# Patient Record
Sex: Male | Born: 1955 | Race: White | Hispanic: No | Smoking: Never smoker
Health system: Southern US, Community
[De-identification: ages and names within clinical notes are randomized; demographics above are authoritative.]

## PROBLEM LIST (undated history)

## (undated) DIAGNOSIS — B182 Chronic viral hepatitis C: Secondary | ICD-10-CM

## (undated) DIAGNOSIS — I10 Essential (primary) hypertension: Secondary | ICD-10-CM

## (undated) DIAGNOSIS — E871 Hypo-osmolality and hyponatremia: Secondary | ICD-10-CM

## (undated) DIAGNOSIS — D689 Coagulation defect, unspecified: Secondary | ICD-10-CM

## (undated) DIAGNOSIS — F418 Other specified anxiety disorders: Secondary | ICD-10-CM

## (undated) DIAGNOSIS — K7040 Alcoholic hepatic failure without coma: Secondary | ICD-10-CM

## (undated) DIAGNOSIS — K529 Noninfective gastroenteritis and colitis, unspecified: Secondary | ICD-10-CM

## (undated) DIAGNOSIS — R51 Headache: Secondary | ICD-10-CM

## (undated) DIAGNOSIS — D649 Anemia, unspecified: Secondary | ICD-10-CM

## (undated) DIAGNOSIS — E43 Unspecified severe protein-calorie malnutrition: Secondary | ICD-10-CM

## (undated) DIAGNOSIS — F1011 Alcohol abuse, in remission: Secondary | ICD-10-CM

## (undated) DIAGNOSIS — G4730 Sleep apnea, unspecified: Secondary | ICD-10-CM

## (undated) DIAGNOSIS — F419 Anxiety disorder, unspecified: Secondary | ICD-10-CM

## (undated) DIAGNOSIS — J309 Allergic rhinitis, unspecified: Secondary | ICD-10-CM

## (undated) DIAGNOSIS — B1920 Unspecified viral hepatitis C without hepatic coma: Secondary | ICD-10-CM

## (undated) DIAGNOSIS — R188 Other ascites: Secondary | ICD-10-CM

## (undated) DIAGNOSIS — G4710 Hypersomnia, unspecified: Secondary | ICD-10-CM

## (undated) DIAGNOSIS — E8809 Other disorders of plasma-protein metabolism, not elsewhere classified: Secondary | ICD-10-CM

## (undated) DIAGNOSIS — Z8719 Personal history of other diseases of the digestive system: Secondary | ICD-10-CM

## (undated) DIAGNOSIS — K5190 Ulcerative colitis, unspecified, without complications: Secondary | ICD-10-CM

## (undated) DIAGNOSIS — K7460 Unspecified cirrhosis of liver: Secondary | ICD-10-CM

## (undated) DIAGNOSIS — D696 Thrombocytopenia, unspecified: Secondary | ICD-10-CM

## (undated) DIAGNOSIS — K7290 Hepatic failure, unspecified without coma: Secondary | ICD-10-CM

## (undated) DIAGNOSIS — K5289 Other specified noninfective gastroenteritis and colitis: Secondary | ICD-10-CM

## (undated) HISTORY — PX: TONSILLECTOMY: SUR1361

## (undated) HISTORY — DX: Noninfective gastroenteritis and colitis, unspecified: K52.9

## (undated) HISTORY — DX: Headache: R51

## (undated) HISTORY — PX: PARACENTESIS: SHX844

## (undated) HISTORY — DX: Alcohol abuse, in remission: F10.11

## (undated) HISTORY — DX: Anxiety disorder, unspecified: F41.9

## (undated) HISTORY — DX: Unspecified viral hepatitis C without hepatic coma: B19.20

---

## 2000-05-26 ENCOUNTER — Inpatient Hospital Stay (HOSPITAL_COMMUNITY): Admission: EM | Admit: 2000-05-26 | Discharge: 2000-05-27 | Payer: Self-pay | Admitting: Emergency Medicine

## 2000-05-26 ENCOUNTER — Encounter: Payer: Self-pay | Admitting: Emergency Medicine

## 2003-03-26 ENCOUNTER — Encounter: Payer: Self-pay | Admitting: Family Medicine

## 2003-03-26 ENCOUNTER — Ambulatory Visit (HOSPITAL_COMMUNITY): Admission: RE | Admit: 2003-03-26 | Discharge: 2003-03-26 | Payer: Self-pay | Admitting: Family Medicine

## 2003-04-24 ENCOUNTER — Ambulatory Visit (HOSPITAL_COMMUNITY): Admission: RE | Admit: 2003-04-24 | Discharge: 2003-04-24 | Payer: Self-pay | Admitting: Neurology

## 2003-07-04 ENCOUNTER — Emergency Department (HOSPITAL_COMMUNITY): Admission: EM | Admit: 2003-07-04 | Discharge: 2003-07-04 | Payer: Self-pay | Admitting: Emergency Medicine

## 2003-08-04 ENCOUNTER — Emergency Department (HOSPITAL_COMMUNITY): Admission: EM | Admit: 2003-08-04 | Discharge: 2003-08-05 | Payer: Self-pay | Admitting: *Deleted

## 2003-12-10 ENCOUNTER — Encounter: Payer: Self-pay | Admitting: Internal Medicine

## 2004-05-05 ENCOUNTER — Ambulatory Visit: Payer: Self-pay | Admitting: Internal Medicine

## 2004-06-07 ENCOUNTER — Ambulatory Visit: Payer: Self-pay | Admitting: Internal Medicine

## 2004-07-01 ENCOUNTER — Ambulatory Visit: Payer: Self-pay | Admitting: Internal Medicine

## 2004-11-12 ENCOUNTER — Emergency Department (HOSPITAL_COMMUNITY): Admission: EM | Admit: 2004-11-12 | Discharge: 2004-11-12 | Payer: Self-pay | Admitting: Emergency Medicine

## 2004-11-16 ENCOUNTER — Ambulatory Visit: Payer: Self-pay | Admitting: Internal Medicine

## 2005-07-05 ENCOUNTER — Ambulatory Visit: Payer: Self-pay | Admitting: Internal Medicine

## 2005-07-13 ENCOUNTER — Ambulatory Visit: Payer: Self-pay | Admitting: Internal Medicine

## 2005-07-28 ENCOUNTER — Encounter: Payer: Self-pay | Admitting: Gastroenterology

## 2005-07-31 ENCOUNTER — Ambulatory Visit: Payer: Self-pay | Admitting: Internal Medicine

## 2005-08-09 ENCOUNTER — Ambulatory Visit: Payer: Self-pay | Admitting: Internal Medicine

## 2005-08-18 ENCOUNTER — Ambulatory Visit: Payer: Self-pay | Admitting: Gastroenterology

## 2005-08-22 ENCOUNTER — Encounter: Payer: Self-pay | Admitting: Internal Medicine

## 2005-08-23 ENCOUNTER — Ambulatory Visit: Payer: Self-pay | Admitting: Gastroenterology

## 2005-08-23 ENCOUNTER — Encounter (INDEPENDENT_AMBULATORY_CARE_PROVIDER_SITE_OTHER): Payer: Self-pay | Admitting: Specialist

## 2005-08-23 LAB — HM COLONOSCOPY

## 2005-08-25 ENCOUNTER — Encounter: Payer: Self-pay | Admitting: Internal Medicine

## 2005-10-02 ENCOUNTER — Ambulatory Visit: Payer: Self-pay | Admitting: Gastroenterology

## 2005-11-24 ENCOUNTER — Ambulatory Visit: Payer: Self-pay | Admitting: Internal Medicine

## 2006-01-01 ENCOUNTER — Encounter: Admission: RE | Admit: 2006-01-01 | Discharge: 2006-01-01 | Payer: Self-pay | Admitting: Internal Medicine

## 2006-11-02 ENCOUNTER — Encounter: Payer: Self-pay | Admitting: Internal Medicine

## 2007-04-04 ENCOUNTER — Ambulatory Visit: Payer: Self-pay | Admitting: Internal Medicine

## 2007-04-04 DIAGNOSIS — K5289 Other specified noninfective gastroenteritis and colitis: Secondary | ICD-10-CM | POA: Insufficient documentation

## 2007-04-04 DIAGNOSIS — F1011 Alcohol abuse, in remission: Secondary | ICD-10-CM | POA: Insufficient documentation

## 2007-04-04 DIAGNOSIS — B1710 Acute hepatitis C without hepatic coma: Secondary | ICD-10-CM | POA: Insufficient documentation

## 2007-04-04 DIAGNOSIS — F411 Generalized anxiety disorder: Secondary | ICD-10-CM | POA: Insufficient documentation

## 2007-04-04 HISTORY — DX: Other specified noninfective gastroenteritis and colitis: K52.89

## 2007-04-26 ENCOUNTER — Ambulatory Visit: Payer: Self-pay | Admitting: Internal Medicine

## 2007-04-30 LAB — CONVERTED CEMR LAB
ALT: 130 units/L — ABNORMAL HIGH (ref 0–53)
AST: 111 units/L — ABNORMAL HIGH (ref 0–37)
Potassium: 4.3 meq/L (ref 3.5–5.1)

## 2007-05-14 ENCOUNTER — Encounter: Payer: Self-pay | Admitting: Internal Medicine

## 2007-08-23 ENCOUNTER — Encounter (INDEPENDENT_AMBULATORY_CARE_PROVIDER_SITE_OTHER): Payer: Self-pay | Admitting: *Deleted

## 2007-10-28 ENCOUNTER — Encounter: Payer: Self-pay | Admitting: Internal Medicine

## 2008-01-29 ENCOUNTER — Ambulatory Visit: Payer: Self-pay | Admitting: Internal Medicine

## 2008-01-29 DIAGNOSIS — R5383 Other fatigue: Secondary | ICD-10-CM

## 2008-01-29 DIAGNOSIS — R5381 Other malaise: Secondary | ICD-10-CM | POA: Insufficient documentation

## 2008-01-29 DIAGNOSIS — R51 Headache: Secondary | ICD-10-CM

## 2008-01-29 DIAGNOSIS — R519 Headache, unspecified: Secondary | ICD-10-CM | POA: Insufficient documentation

## 2008-01-30 ENCOUNTER — Telehealth (INDEPENDENT_AMBULATORY_CARE_PROVIDER_SITE_OTHER): Payer: Self-pay | Admitting: *Deleted

## 2008-01-30 ENCOUNTER — Encounter (INDEPENDENT_AMBULATORY_CARE_PROVIDER_SITE_OTHER): Payer: Self-pay | Admitting: *Deleted

## 2008-01-30 LAB — CONVERTED CEMR LAB
ALT: 202 units/L — ABNORMAL HIGH (ref 0–53)
AST: 168 units/L — ABNORMAL HIGH (ref 0–37)
Albumin: 3.6 g/dL (ref 3.5–5.2)
Alkaline Phosphatase: 60 units/L (ref 39–117)
BUN: 12 mg/dL (ref 6–23)
Basophils Relative: 0.2 % (ref 0.0–3.0)
CO2: 29 meq/L (ref 19–32)
Chloride: 100 meq/L (ref 96–112)
Creatinine, Ser: 0.9 mg/dL (ref 0.4–1.5)
Eosinophils Relative: 1.2 % (ref 0.0–5.0)
Lymphocytes Relative: 23.8 % (ref 12.0–46.0)
Neutrophils Relative %: 63.5 % (ref 43.0–77.0)
RBC: 4.4 M/uL (ref 4.22–5.81)
Total Bilirubin: 0.9 mg/dL (ref 0.3–1.2)
WBC: 5 10*3/uL (ref 4.5–10.5)

## 2008-02-10 ENCOUNTER — Telehealth: Payer: Self-pay | Admitting: Gastroenterology

## 2008-02-11 ENCOUNTER — Telehealth: Payer: Self-pay | Admitting: Gastroenterology

## 2008-02-13 ENCOUNTER — Ambulatory Visit: Payer: Self-pay | Admitting: Gastroenterology

## 2008-02-13 ENCOUNTER — Telehealth: Payer: Self-pay | Admitting: Gastroenterology

## 2008-02-14 ENCOUNTER — Telehealth: Payer: Self-pay | Admitting: Gastroenterology

## 2008-02-17 ENCOUNTER — Telehealth: Payer: Self-pay | Admitting: Gastroenterology

## 2008-02-21 ENCOUNTER — Ambulatory Visit: Payer: Self-pay | Admitting: Internal Medicine

## 2008-02-21 ENCOUNTER — Telehealth: Payer: Self-pay | Admitting: Gastroenterology

## 2008-02-21 LAB — CONVERTED CEMR LAB
Blood in Urine, dipstick: NEGATIVE
Glucose, Bld: 116 mg/dL
Nitrite: NEGATIVE
Rapid Strep: NEGATIVE
Urobilinogen, UA: 0.2
WBC Urine, dipstick: NEGATIVE

## 2008-02-22 ENCOUNTER — Encounter: Payer: Self-pay | Admitting: Internal Medicine

## 2008-02-22 LAB — CONVERTED CEMR LAB
Squamous Epithelial / LPF: NONE SEEN /lpf
WBC, UA: NONE SEEN cells/hpf (ref <3)

## 2008-02-24 ENCOUNTER — Telehealth: Payer: Self-pay | Admitting: Gastroenterology

## 2008-02-24 ENCOUNTER — Telehealth (INDEPENDENT_AMBULATORY_CARE_PROVIDER_SITE_OTHER): Payer: Self-pay | Admitting: *Deleted

## 2008-02-24 LAB — CONVERTED CEMR LAB
Alkaline Phosphatase: 64 units/L (ref 39–117)
Basophils Absolute: 0 10*3/uL (ref 0.0–0.1)
Bilirubin, Direct: 0.2 mg/dL (ref 0.0–0.3)
Calcium: 9.3 mg/dL (ref 8.4–10.5)
Eosinophils Absolute: 0 10*3/uL (ref 0.0–0.7)
GFR calc Af Amer: 114 mL/min
GFR calc non Af Amer: 94 mL/min
Glucose, Bld: 115 mg/dL — ABNORMAL HIGH (ref 70–99)
HCT: 47 % (ref 39.0–52.0)
MCHC: 34.4 g/dL (ref 30.0–36.0)
Monocytes Absolute: 0.7 10*3/uL (ref 0.1–1.0)
Monocytes Relative: 6.9 % (ref 3.0–12.0)
Neutro Abs: 8.9 10*3/uL — ABNORMAL HIGH (ref 1.4–7.7)
Platelets: 110 10*3/uL — ABNORMAL LOW (ref 150–400)
Potassium: 4.4 meq/L (ref 3.5–5.1)
RDW: 11.7 % (ref 11.5–14.6)
Sed Rate: 6 mm/hr (ref 0–16)
Sodium: 135 meq/L (ref 135–145)
Total Bilirubin: 1.1 mg/dL (ref 0.3–1.2)

## 2008-03-10 ENCOUNTER — Telehealth: Payer: Self-pay | Admitting: Internal Medicine

## 2008-03-11 ENCOUNTER — Ambulatory Visit: Payer: Self-pay | Admitting: Gastroenterology

## 2008-03-26 ENCOUNTER — Telehealth: Payer: Self-pay | Admitting: Internal Medicine

## 2008-04-28 ENCOUNTER — Ambulatory Visit: Payer: Self-pay | Admitting: Internal Medicine

## 2008-04-28 DIAGNOSIS — J309 Allergic rhinitis, unspecified: Secondary | ICD-10-CM

## 2008-04-28 DIAGNOSIS — R55 Syncope and collapse: Secondary | ICD-10-CM | POA: Insufficient documentation

## 2008-04-28 HISTORY — DX: Allergic rhinitis, unspecified: J30.9

## 2008-05-01 ENCOUNTER — Telehealth (INDEPENDENT_AMBULATORY_CARE_PROVIDER_SITE_OTHER): Payer: Self-pay | Admitting: *Deleted

## 2008-05-04 ENCOUNTER — Telehealth (INDEPENDENT_AMBULATORY_CARE_PROVIDER_SITE_OTHER): Payer: Self-pay | Admitting: *Deleted

## 2008-05-07 ENCOUNTER — Telehealth: Payer: Self-pay | Admitting: Gastroenterology

## 2008-05-11 ENCOUNTER — Encounter: Payer: Self-pay | Admitting: Internal Medicine

## 2008-06-17 ENCOUNTER — Telehealth (INDEPENDENT_AMBULATORY_CARE_PROVIDER_SITE_OTHER): Payer: Self-pay | Admitting: *Deleted

## 2008-08-06 ENCOUNTER — Telehealth (INDEPENDENT_AMBULATORY_CARE_PROVIDER_SITE_OTHER): Payer: Self-pay | Admitting: *Deleted

## 2008-08-13 ENCOUNTER — Ambulatory Visit: Payer: Self-pay | Admitting: Internal Medicine

## 2008-08-13 DIAGNOSIS — F329 Major depressive disorder, single episode, unspecified: Secondary | ICD-10-CM

## 2008-08-25 ENCOUNTER — Telehealth: Payer: Self-pay | Admitting: Internal Medicine

## 2008-08-31 ENCOUNTER — Telehealth: Payer: Self-pay | Admitting: Internal Medicine

## 2008-09-01 ENCOUNTER — Ambulatory Visit: Payer: Self-pay | Admitting: Internal Medicine

## 2008-10-02 ENCOUNTER — Encounter: Payer: Self-pay | Admitting: Internal Medicine

## 2008-10-05 ENCOUNTER — Telehealth: Payer: Self-pay | Admitting: Internal Medicine

## 2008-11-17 ENCOUNTER — Encounter: Payer: Self-pay | Admitting: Internal Medicine

## 2008-11-24 ENCOUNTER — Ambulatory Visit: Payer: Self-pay | Admitting: Internal Medicine

## 2008-11-24 DIAGNOSIS — R197 Diarrhea, unspecified: Secondary | ICD-10-CM

## 2008-11-25 LAB — CONVERTED CEMR LAB: TSH: 1.71 microintl units/mL (ref 0.35–5.50)

## 2008-12-11 ENCOUNTER — Encounter: Payer: Self-pay | Admitting: Gastroenterology

## 2008-12-16 ENCOUNTER — Encounter: Payer: Self-pay | Admitting: Internal Medicine

## 2009-01-26 ENCOUNTER — Ambulatory Visit: Payer: Self-pay | Admitting: Internal Medicine

## 2009-02-09 ENCOUNTER — Telehealth: Payer: Self-pay | Admitting: Internal Medicine

## 2009-02-26 ENCOUNTER — Telehealth (INDEPENDENT_AMBULATORY_CARE_PROVIDER_SITE_OTHER): Payer: Self-pay | Admitting: *Deleted

## 2009-03-09 ENCOUNTER — Telehealth: Payer: Self-pay | Admitting: Internal Medicine

## 2009-03-18 ENCOUNTER — Telehealth: Payer: Self-pay | Admitting: Internal Medicine

## 2009-05-05 ENCOUNTER — Telehealth: Payer: Self-pay | Admitting: Internal Medicine

## 2009-06-09 ENCOUNTER — Encounter: Payer: Self-pay | Admitting: Internal Medicine

## 2009-06-11 ENCOUNTER — Telehealth (INDEPENDENT_AMBULATORY_CARE_PROVIDER_SITE_OTHER): Payer: Self-pay | Admitting: *Deleted

## 2009-06-15 ENCOUNTER — Ambulatory Visit: Payer: Self-pay | Admitting: Family

## 2009-06-15 DIAGNOSIS — J321 Chronic frontal sinusitis: Secondary | ICD-10-CM

## 2009-06-15 DIAGNOSIS — Z8719 Personal history of other diseases of the digestive system: Secondary | ICD-10-CM

## 2009-06-15 HISTORY — DX: Personal history of other diseases of the digestive system: Z87.19

## 2009-06-30 ENCOUNTER — Ambulatory Visit: Payer: Self-pay | Admitting: Internal Medicine

## 2009-07-23 ENCOUNTER — Encounter: Payer: Self-pay | Admitting: Internal Medicine

## 2009-08-06 ENCOUNTER — Ambulatory Visit: Payer: Self-pay | Admitting: Internal Medicine

## 2009-08-09 LAB — CONVERTED CEMR LAB
Basophils Absolute: 0 10*3/uL (ref 0.0–0.1)
Basophils Relative: 0 % (ref 0.0–3.0)
CO2: 29 meq/L (ref 19–32)
Calcium: 9.3 mg/dL (ref 8.4–10.5)
Chloride: 105 meq/L (ref 96–112)
Eosinophils Absolute: 0.1 10*3/uL (ref 0.0–0.7)
Glucose, Bld: 109 mg/dL — ABNORMAL HIGH (ref 70–99)
Hemoglobin: 14.8 g/dL (ref 13.0–17.0)
Lymphs Abs: 1.5 10*3/uL (ref 0.7–4.0)
MCHC: 32.9 g/dL (ref 30.0–36.0)
MCV: 93.8 fL (ref 78.0–100.0)
Monocytes Absolute: 0.5 10*3/uL (ref 0.1–1.0)
Neutro Abs: 3.7 10*3/uL (ref 1.4–7.7)
RBC: 4.8 M/uL (ref 4.22–5.81)
RDW: 13.9 % (ref 11.5–14.6)
Sodium: 139 meq/L (ref 135–145)

## 2009-08-11 ENCOUNTER — Encounter (INDEPENDENT_AMBULATORY_CARE_PROVIDER_SITE_OTHER): Payer: Self-pay | Admitting: *Deleted

## 2009-08-12 LAB — CONVERTED CEMR LAB
Testosterone Free: 41.4 pg/mL — ABNORMAL LOW (ref 47.0–244.0)
Testosterone-% Free: 0.7 % — ABNORMAL LOW (ref 1.6–2.9)

## 2009-09-02 ENCOUNTER — Telehealth (INDEPENDENT_AMBULATORY_CARE_PROVIDER_SITE_OTHER): Payer: Self-pay | Admitting: *Deleted

## 2009-09-17 ENCOUNTER — Encounter: Payer: Self-pay | Admitting: Internal Medicine

## 2009-09-20 ENCOUNTER — Ambulatory Visit: Payer: Self-pay | Admitting: Pulmonary Disease

## 2009-09-20 DIAGNOSIS — G4710 Hypersomnia, unspecified: Secondary | ICD-10-CM

## 2009-09-20 DIAGNOSIS — G4730 Sleep apnea, unspecified: Secondary | ICD-10-CM

## 2009-09-20 HISTORY — DX: Hypersomnia, unspecified: G47.10

## 2009-09-21 ENCOUNTER — Encounter: Payer: Self-pay | Admitting: Internal Medicine

## 2009-09-28 ENCOUNTER — Ambulatory Visit: Payer: Self-pay | Admitting: Internal Medicine

## 2009-09-30 ENCOUNTER — Encounter: Payer: Self-pay | Admitting: Internal Medicine

## 2009-10-22 ENCOUNTER — Telehealth (INDEPENDENT_AMBULATORY_CARE_PROVIDER_SITE_OTHER): Payer: Self-pay | Admitting: *Deleted

## 2009-10-29 ENCOUNTER — Encounter: Payer: Self-pay | Admitting: Internal Medicine

## 2009-11-08 ENCOUNTER — Telehealth (INDEPENDENT_AMBULATORY_CARE_PROVIDER_SITE_OTHER): Payer: Self-pay | Admitting: *Deleted

## 2009-12-01 ENCOUNTER — Encounter: Payer: Self-pay | Admitting: Internal Medicine

## 2010-03-22 ENCOUNTER — Encounter: Payer: Self-pay | Admitting: Internal Medicine

## 2010-07-24 LAB — CONVERTED CEMR LAB
ALT: 126 units/L — ABNORMAL HIGH (ref 0–53)
ALT: 232 units/L — ABNORMAL HIGH (ref 0–53)
AST: 101 units/L — ABNORMAL HIGH (ref 0–37)
Albumin: 3.9 g/dL (ref 3.5–5.2)
Alkaline Phosphatase: 73 units/L (ref 39–117)
Alkaline Phosphatase: 84 units/L (ref 39–117)
BUN: 22 mg/dL (ref 6–23)
Basophils Absolute: 0 10*3/uL (ref 0.0–0.1)
Basophils Relative: 0 % (ref 0.0–1.0)
Bilirubin, Direct: 0.2 mg/dL (ref 0.0–0.3)
Bilirubin, Direct: 0.5 mg/dL — ABNORMAL HIGH (ref 0.0–0.3)
CO2: 26 meq/L (ref 19–32)
Calcium: 9.7 mg/dL (ref 8.4–10.5)
Chloride: 103 meq/L (ref 96–112)
Cholesterol: 174 mg/dL (ref 0–200)
Creatinine, Ser: 0.6 mg/dL (ref 0.4–1.5)
Eosinophils Absolute: 0 10*3/uL (ref 0.0–0.6)
Eosinophils Absolute: 0.1 10*3/uL (ref 0.0–0.7)
Eosinophils Relative: 0.6 % (ref 0.0–5.0)
Eosinophils Relative: 1.5 % (ref 0.0–5.0)
GFR calc Af Amer: 183 mL/min
GFR calc non Af Amer: 151 mL/min
Glucose, Bld: 104 mg/dL — ABNORMAL HIGH (ref 70–99)
HCT: 45.5 % (ref 39.0–52.0)
HDL: 42.5 mg/dL (ref >39.0)
Hemoglobin: 15.8 g/dL (ref 13.0–17.0)
LDL Cholesterol: 107 mg/dL — ABNORMAL HIGH (ref 0–99)
Lymphocytes Relative: 20.2 % (ref 12.0–46.0)
MCHC: 34.8 g/dL (ref 30.0–36.0)
MCV: 95.1 fL (ref 78.0–100.0)
Monocytes Absolute: 0.6 10*3/uL (ref 0.1–1.0)
Monocytes Absolute: 0.6 10*3/uL (ref 0.2–0.7)
Monocytes Relative: 13 % — ABNORMAL HIGH (ref 3.0–12.0)
Monocytes Relative: 8.6 % (ref 3.0–11.0)
Neutro Abs: 5.4 10*3/uL (ref 1.4–7.7)
Neutrophils Relative %: 62 % (ref 43.0–77.0)
Neutrophils Relative %: 70.6 % (ref 43.0–77.0)
PSA: 0.63 ng/mL (ref 0.10–4.00)
Platelets: 150 10*3/uL (ref 150–400)
Platelets: 73 10*3/uL — ABNORMAL LOW (ref 150–400)
Potassium: 5.4 meq/L — ABNORMAL HIGH (ref 3.5–5.1)
RBC: 4.79 M/uL (ref 4.22–5.81)
RDW: 11.2 % — ABNORMAL LOW (ref 11.5–14.6)
RDW: 12.8 % (ref 11.5–14.6)
Sodium: 137 meq/L (ref 135–145)
TSH: 1.58 microintl units/mL (ref 0.35–5.50)
Total Bilirubin: 1 mg/dL (ref 0.3–1.2)
Total Bilirubin: 1 mg/dL (ref 0.3–1.2)
Total CHOL/HDL Ratio: 4.1
Total Protein: 6.5 g/dL (ref 6.0–8.3)
Total Protein: 7 g/dL (ref 6.0–8.3)
Triglycerides: 123 mg/dL (ref 0–149)
VLDL: 25 mg/dL (ref 0–40)
WBC: 4.3 10*3/uL — ABNORMAL LOW (ref 4.5–10.5)
WBC: 7.5 10*3/uL (ref 4.5–10.5)

## 2010-07-26 NOTE — Letter (Signed)
[facility]   Imported By: Lanelle Bal 10/01/2009 13:18:03  _____________________________________________________________________  External Attachment:    Type:   Image     Comment:   External Document

## 2010-07-26 NOTE — Assessment & Plan Note (Signed)
Summary: fatigue/apc   Visit Type:  Initial Consult Copy to:  pcp Primary Provider/Referring Provider:  Willow Ora, MD  CC:  Pt here for sleep consult. 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 His ex wife had noted loud snoring especially on his back. He reprots excessive daytime fatigue. TSH & testesterone levels are nml. Epworth Sleepiness Score is 8/24. Bedtime is 10.30 p, latency is a few mins,  has 4-5 spontnaeous awakenings, gets oob at 645 am, drops his son to school & then sleeps again from 8a to 11 30 A. No bed partner history is available.He denies gasping or choking episodes. He ahs gained 20 lbs in the last 2 years. There is no history suggestive of cataplexy, sleep paralysis or parasomnias   Preventive Screening-Counseling & Management  Alcohol-Tobacco     Smoking Status: quit     Packs/Day: 1.0     Year Started: 1980     Year Quit: 2005   History of Present Illness: Sleep all the time wake up take my son to school 6:45 am go back to bed for about 5 hours wake up, go bed 10:45pm  What time do you typically go to bed?(between what hours): 10:30pm  How long does it take you to fall asleep? sometimes 5 mins, sometimes longer  How many times during the night do you wake up? 4 or 5 times  What time do you get out of bed to start your day? 6:45am  Do you drive or operate heavy machinery in your occupation? no  How much has your weight changed (up or down) over the past two years? (in pounds): increase 20 lbs  Have you ever had a sleep study before?  If yes,when and where: no  Do you currently use CPAP ? If so , at what pressure? no  Do you wear oxygen at any time? If yes, how many liters per minute? no Current Medications (verified): 1)  Colazal 750 Mg Caps (Balsalazide Disodium) .... 3 By Mouth Three Times A Day 2)  Clarinex 5 Mg Tabs (Desloratadine) .Bradley Woods.. 1 By Mouth Once Daily 3)  Acidophilus   Caps (Lactobacillus) .... Take 1 Tablet By Mouth Once A Day 4)  Imodium Advanced 2-125 Mg Chew (Loperamide-Simethicone) .... Take 1 Tablet By Mouth Once A Day 5)  Gas-X Extra Strength 125 Mg Chew (Simethicone) .... Take 1 Tablet By Mouth Once A Day  Allergies (verified): 1)  ! * Antibiotics  Past History:  Past Surgical History: Tonsillectomy  Social History: separated (2008) but good relationship w/ wife single father on disability Patient has never smoked.  Alcohol Use - no Illicit Drug Use - no Daily Caffeine Use Pt is on Liver Transplant @ [facility] Patient states former smoker.  Smoking Status:  quit Packs/Day:  1.0  Review of Systems       The patient complains of weight change, abdominal pain, headaches, sneezing, and anxiety.  The patient denies shortness of breath with activity, shortness of breath at rest, productive cough, non-productive cough, coughing up blood, chest pain, irregular heartbeats, acid heartburn, indigestion, loss of appetite, difficulty swallowing, sore throat, tooth/dental problems, nasal congestion/difficulty breathing through nose, itching, ear ache, depression, hand/feet swelling, joint stiffness or pain, rash, change in color of mucus, and fever.    Vital Signs:  Patient profile:   55 year old male Height:      69 inches Weight:      206.13  pounds BMI:     30.55 O2 Sat:      97 % on Room air Temp:     97.9 degrees F oral Pulse rate:   75 / minute BP sitting:   150 / 84  (left arm) Cuff size:   regular  Vitals Entered By: Zackery Barefoot CMA (September 20, 2009 9:15 AM)  O2 Flow:  Room air CC: Pt here for sleep consult.  Comments Medications reviewed with patient Verified contact number and pharmacy with patient Zackery Barefoot CMA  September 20, 2009 9:22 AM    Physical Exam  Additional Exam:  Gen. Pleasant, well-nourished, in no distress, normal affect ENT - no lesions, no post nasal drip, class 2 airway Neck: No JVD, no  thyromegaly, no carotid bruits Lungs: no use of accessory muscles, no dullness to percussion, clear without rales or rhonchi  Cardiovascular: Rhythm regular, heart sounds  normal, no murmurs or gallops, no peripheral edema Abdomen: soft and non-tender, no hepatosplenomegaly, BS normal. Musculoskeletal: No deformities, no cyanosis or clubbing Neuro:  alert, non focal     Impression & Recommendations:  Problem # 1:  HYPERSOMNIA, ASSOCIATED WITH SLEEP APNEA (ICD-780.53) The pathophysiology of obstructive sleep apnea, it's cardiovascular consequences and modes of treatment including CPAP were discussed with the patient in great detail.  Since other etiologies of fatigue & somnolence have been investigated, it is reasonable to proceed with overnight polysomnogram. The basence of bed partner history makes it difficult to give an accurate pre-test probability. Weight loss advised, would likley treat if AHI 15 & above. Orders: Consultation Level III (06301) Sleep Disorder Referral (Sleep Disorder)  Medications Added to Medication List This Visit: 1)  Acidophilus Caps (Lactobacillus) .... Take 1 tablet by mouth once a day 2)  Imodium Advanced 2-125 Mg Chew (Loperamide-simethicone) .... Take 1 tablet by mouth once a day 3)  Gas-x Extra Strength 125 Mg Chew (Simethicone) .... Take 1 tablet by mouth once a day  Patient Instructions: 1)  Copy sent to:Dr paz 2)  Please schedule a follow-up appointment in 2 weeks after sleep study

## 2010-07-26 NOTE — Assessment & Plan Note (Signed)
Summary: Bradley Woods/swh   Vital Signs:  Patient profile:   55 year old male Height:      69 inches Weight:      202.2 pounds BMI:     29.97 Pulse rate:   64 / minute BP sitting:   122 / 80  Vitals Entered By: Shary Decamp (September 28, 2009 11:04 AM) CC: Bradley Woods    History of Present Illness: Bradley Woods patient just  like to keep me updated on what's going on with his health  extensive notes from [facility] reviewed he was seen there with diarrhea, hematochezia and mucus discharge.  He has a history of ulcerative proctitis. having difficulty controlling his bowels  They recommended him to have a fecal cal protectin  test  saw pulmonary, sleep study pending  to see Hep C clinic in september   continue w/ fatigue , slow thinking  may need to get a disability update   Current Medications (verified): 1)  Colazal 750 Mg Caps (Balsalazide Disodium) .... 3 By Mouth Three Times A Day 2)  Clarinex 5 Mg Tabs (Desloratadine) .Marland Kitchen.. 1 By Mouth Once Daily 3)  Acidophilus  Caps (Lactobacillus) .... Take 1 Tablet By Mouth Once A Day 4)  Imodium Advanced 2-125 Mg Chew (Loperamide-Simethicone) .... Take 1 Tablet By Mouth Once A Day 5)  Gas-X Extra Strength 125 Mg Chew (Simethicone) .... Take 1 Tablet By Mouth Once A Day  Allergies (verified): 1)  ! * Antibiotics  Past History:  Past Medical History: Reviewed history from 11/24/2008 and no changes required. Hepatitis C Anxiety x a while, Dx w/ depression 07-2008 h/o COLITIS   ALCOHOL ABUSE, IN REMISSION (ICD-305.03) Headache Allergic rhinitis disable (since aprox 2005)  Past Surgical History: Reviewed history from 09/20/2009 and no changes required. Tonsillectomy  Review of Systems       anxiety and depression still there, not interested in treatment no suicidal ideas  Physical Exam  General:  alert and well-developed.   Lungs:  normal respiratory effort, no intercostal retractions, no accessory muscle use, and normal breath sounds.     Heart:  normal rate, regular rhythm, no murmur, and no gallop.     Impression & Recommendations:  Problem # 1:  DISABILITY (ICD-349.0)  patient is currently on disability apparently his disability  is up for review, he feels that he cannot work at present due to his multiple medical conditions and lack of energy "in a good day I have 4 hours of energy; and still be able to go to the "[employer]"  sometimes" paper chart is reviewed, in the past I have said that he is very bright disabled due to fatigue. If  he  needs  ongoing/permanent disability he will need an  assessment from PT/OT  Problem # 2:  HEPATITIS C (ICD-070.51) per [facility]  Problem # 3:  COLITIS (ICD-558.9) Per [facility] His updated medication list for this problem includes:    Acidophilus Caps (Lactobacillus) .Marland Kitchen... Take 1 tablet by mouth once a day    Imodium Advanced 2-125 Mg Chew (Loperamide-simethicone) .Marland Kitchen... Take 1 tablet by mouth once a day  Complete Medication List: 1)  Colazal 750 Mg Caps (Balsalazide disodium) .... 3 by mouth three times a day 2)  Clarinex 5 Mg Tabs (Desloratadine) .Marland Kitchen.. 1 by mouth once daily 3)  Acidophilus Caps (Lactobacillus) .... Take 1 tablet by mouth once a day 4)  Imodium Advanced 2-125 Mg Chew (Loperamide-simethicone) .... Take 1 tablet by mouth once a day 5)  Gas-x Extra Strength 125 Mg Chew (Simethicone) .... Take 1 tablet by mouth once a day  Other Orders: T-Culture, Stool ([id number])

## 2010-07-26 NOTE — Letter (Signed)
[facility]   Imported By: Lanelle Bal 10/06/2009 09:05:12  _____________________________________________________________________  External Attachment:    Type:   Image     Comment:   External Document

## 2010-07-26 NOTE — Progress Notes (Signed)
Summary: FAX REPORT TO [facility] FOR REFERRAL  Phone Note Call from Patient Call back at Home Phone [phone]   Call For: DR [facility] Reason for Call: Referral Summary of Call: NEEDS REPORTS FAXED TO [facility] ON HIS ENLARGED SLEEN DR FOUND YESTERDAY TO DR Katrinka Blazing - [phone] FAX. SAYS HE WAS REFERRED THERE. Initial call taken by: Leanor Kail [facility],  February 14, 2008 10:18 AM  Follow-up for Phone Call        CALLED PT L/M THAT I WOULD FAX ALL THE INFORMATION REQUESTED Follow-up by: Merri Ray CMA,  February 14, 2008 12:37 PM  Additional Follow-up for Phone Call Additional follow up Details #1::        FAXED ALL INFORMATION REQUESTED FROM PT TODAY Additional Follow-up by: Merri Ray CMA,  February 19, 2008 10:43 AM

## 2010-07-26 NOTE — Letter (Signed)
[facility]: [facility]   Imported By: Lanelle Bal 04/12/2010 15:08:06  _____________________________________________________________________  External Attachment:    Type:   Image     Comment:   External Document  Appended Document: [facility] stable, needs a flu shot.. please call the patient, offer  him a flu shot  Appended Document: [facility] Clinic  Called patient, went striaght to voicemail, box is full. Will call back again later.Harold Barban  April 21, 2010 8:38 AM   Called patient, went striaght to voicemail, box is full. Will call back again later.Harold Barban  April 22, 2010 9:08 AM    Called patient, went straight to voicemail, box is full. Will mail letter.Harold Barban  April 25, 2010 11:21 AM

## 2010-07-26 NOTE — Progress Notes (Signed)
Summary: Phone ok for dental procedure toay  Phone Note Call from Patient Call back at Home Phone [phone]   Caller: Patient Summary of Call: Patient is requesting approval for Dr. Drue Novel to approve for his tooth to get pulled today. Dentist Dr. Domingo Dimes Phone-[phone]. Please advise Patient has an appt today @ 2pm with Dr. Domingo Dimes Initial call taken by: Barb Merino,  September 02, 2009 12:28 PM  Follow-up for Phone Call        Spoke with Dr Domingo Dimes office, pt is having 2 teeth extracted today, Dr Domingo Dimes wanted to confirm it is OK with pt medical history Pt on liver transplant list. Follow-up by: Kandice Hams,  September 02, 2009 12:47 PM  Additional Follow-up for Phone Call Additional follow up Details #1::        i don't see a contraindication Jose E. Paz MD  September 02, 2009 12:59 PM   Dr Domingo Dimes office informed, pt informed .Kandice Hams  September 02, 2009 1:07 PM  Additional Follow-up by: Kandice Hams,  September 02, 2009 1:07 PM

## 2010-07-26 NOTE — Progress Notes (Signed)
Summary: nos appt  Phone Note Call from Patient   Caller: [email or web address]  Call For: alva Summary of Call: Called pt in ref to nos from 5/13, pt states he has been ill & he will call to rsc after he has rsc his sleep study. Initial call taken by: Darletta Moll,  Nov 08, 2009 9:12 AM

## 2010-07-26 NOTE — Assessment & Plan Note (Signed)
Summary: no energy, loss of hair,? low testosterone//fd   Vital Signs:  Patient profile:   55 year old male Height:      69 inches Weight:      199.6 pounds BMI:     29.58 O2 Sat:      98 % Temp:     98.6 degrees F Pulse rate:   90 / minute BP sitting:   150 / 96  Vitals Entered By: Shary Decamp (August 06, 2009 9:24 AM) CC: would like to discuss testosterone, c/o of hair loss & no energy, states that he is not depressed   History of Present Illness: his chief complaint today is lack of energy complain of fatigue, no drive, hair  loss. his friend has low testosterone and likes to be checked denies any depression symptoms, doesn't feel sad or blue  review of systems admits to snoring quite a bit , he had a sleep study in the late 90s which was negative does  feel sleepy all day long his endurance has decreased quite a bit according to the patient, he was able to run longer periods of time  before, he still goes to the gym daily though dyspnea on on exertion only "if I push too hard"        Current Medications (verified): 1)  Colazal 750 Mg Caps (Balsalazide Disodium) .... 3 By Mouth Three Times A Day 2)  Clarinex 5 Mg Tabs (Desloratadine) .Marland Kitchen.. 1 By Mouth Once Daily  Allergies (verified): No Known Drug Allergies  Past History:  Past Medical History: Reviewed history from 11/24/2008 and no changes required. Hepatitis C Anxiety x a while, Dx w/ depression 07-2008 h/o COLITIS   ALCOHOL ABUSE, IN REMISSION (ICD-305.03) Headache Allergic rhinitis disable (since aprox 2005)  Past Surgical History: Reviewed history from 02/13/2008 and no changes required. Tonsillectomy  Social History: Reviewed history from 03/11/2008 and no changes required. separated (2008) but good relationship w/ wife single father on disability Patient has never smoked.  Alcohol Use - no Illicit Drug Use - no Daily Caffeine Use  Review of Systems      See HPI  Physical  Exam  General:  alert, well-developed, and well-nourished.   Neck:  no JVD at 45 degrees Lungs:  normal respiratory effort, no intercostal retractions, no accessory muscle use, and normal breath sounds.   Heart:  normal rate, regular rhythm, and no murmur.   Abdomen:  soft, non-tender, and no distention.   Extremities:  no edema   Impression & Recommendations:  Problem # 1:  FATIGUE (ICD-780.79) symptoms are nonspecific he has decreased endurance but  no signs of vol overload/CHF he does have myalgias, will check a CK Will also check a testosterone level , if they are abnormal, he will be referred to endocrinology if workup negative, refer to pulmonary, ref. repeat sleep study   Orders: Venipuncture (81191) TLB-BMP (Basic Metabolic Panel-BMET) (80048-METABOL) TLB-CBC Platelet - w/Differential (85025-CBCD) TLB-TSH (Thyroid Stimulating Hormone) (84443-TSH) TLB-Testosterone, Total (84403-TESTO) T- * Misc. Laboratory test [id number]) TLB-CK Total Only(Creatine Kinase/CPK) (82550-CK)  Complete Medication List: 1)  Colazal 750 Mg Caps (Balsalazide disodium) .... 3 by mouth three times a day 2)  Clarinex 5 Mg Tabs (Desloratadine) .Marland Kitchen.. 1 by mouth once daily  Patient Instructions: 1)  Please schedule a follow-up appointment in 1 month.

## 2010-07-26 NOTE — Progress Notes (Signed)
Summary: lab order  LAB ORDER FROM [facility] [phone] (fax) Shaune Spittle, PA-C Riccardo Dubin, MD  ORDER: Fecal calprotectin stool test  dx 787.91, 789.00, 556.0  Regina, Please contact lab for test code number & collection instructions.  Then call pt with instructions. Shary Decamp  October 22, 2009 2:40 PM

## 2010-07-26 NOTE — Letter (Signed)
Summary: [facility]  [facility]   Imported By: Lanelle Bal 12/22/2009 09:31:05  _____________________________________________________________________  External Attachment:    Type:   Image     Comment:   External Document

## 2010-07-26 NOTE — Assessment & Plan Note (Signed)
Summary: having pains during workouts//ccm   Vital Signs:  Patient profile:   55 year old male Height:      69 inches Weight:      201.8 pounds Pulse rate:   74 / minute BP sitting:   110 / 80  Vitals Entered By: Shary Decamp (June 30, 2009 4:16 PM) \Comments  - bilat hip pain x last year  - bilat groin pain  - hips "lock up" Shary Decamp  June 30, 2009 4:17 PM    History of Present Illness: bilat hip pain x last year usually associated to goint to the gym, he does statinary bike-elliptical-treadmill occasionally R  hip  "lock up" occasionally pain at the lateral aspect of the L hip pain better depending on the secuence of he exercise (better when does elliptical at the end) using tylenol sparingly, avoiding NSAIDs as rec by [facility] (per patient)  Current Medications (verified): 1)  Colazal 750 Mg Caps (Balsalazide Disodium) .... 3 By Mouth Three Times A Day 2)  Clarinex 5 Mg Tabs (Desloratadine) .Marland Kitchen.. 1 By Mouth Once Daily  Allergies (verified): No Known Drug Allergies  Past History:  Past Medical History: Reviewed history from 11/24/2008 and no changes required. Hepatitis C Anxiety x a while, Dx w/ depression 07-2008 h/o COLITIS   ALCOHOL ABUSE, IN REMISSION (ICD-305.03) Headache Allergic rhinitis disable (since aprox 2005)  Past Surgical History: Reviewed history from 02/13/2008 and no changes required. Tonsillectomy  Review of Systems       denies actual mass in the groin or hernia no lower extremity edema  Physical Exam  General:  alert and well-developed.   Abdomen:  soft, non-tender, no distention, and no masses.  no inguinal hernias Extremities:  no lower extremity edema rotation of the hips  cause some discomfort, mild, mostly with lateralization no actual tenderness over trochanteric area Neurologic:  Malter is symmetric   Impression & Recommendations:  Problem # 1:  ? of DEGENERATIVE JOINT DISEASE, MILD (ICD-715.90) Assessment  New hip  pain likely related to mild OA no clear-cut trochanteric bursitis recommend gentle/consistent exercise continues and Tylenol sparingly call if not better: x-ray?, PT referral?  Complete Medication List: 1)  Colazal 750 Mg Caps (Balsalazide disodium) .... 3 by mouth three times a day 2)  Clarinex 5 Mg Tabs (Desloratadine) .Marland Kitchen.. 1 by mouth once daily

## 2010-07-26 NOTE — Letter (Signed)
Summary: Primary Care Consult Scheduled Letter  [facility] [place]  [address] [place], [place] [address]   Phone: [phone]  Fax: [phone]      08/11/2009 MRN: [id number]  Bradley Woods [address] [place], [place]  [address]    Dear Bradley Woods,    We have scheduled an appointment for you.  At the recommendation of Dr. Willow Ora, we have scheduled you a consult with Dr. Vassie Loll with [facility] on 08-24-2009 at 2:30pm.  Their address is [address] [place], [place] [address]. The office phone number is [phone].  If this appointment day and time is not convenient for you, please feel free to call the office of the doctor you are being referred to at the number listed above and reschedule the appointment.    It is important for you to keep your scheduled appointments. We are here to make sure you are given good patient care.   Thank you,    Renee, Patient Care Coordinator [facility]    **IF YOU ARE UNABLE TO KEEP THIS APPOINTMENT OR NEED TO RESCHEDULE, PLEASE GIVE A 24 HOUR NOTICE TO AVOID A $50 FEE**

## 2010-07-26 NOTE — Letter (Signed)
[facility]   Imported By: Lanelle Bal 06/28/2009 08:35:26  _____________________________________________________________________  External Attachment:    Type:   Image     Comment:   External Document

## 2010-07-27 ENCOUNTER — Encounter: Payer: Self-pay | Admitting: Internal Medicine

## 2010-09-01 NOTE — Letter (Signed)
Summary: will need labs done here----[facility]   Imported By: Maryln Gottron 08/16/2010 13:19:26  _____________________________________________________________________  External Attachment:    Type:   Image     Comment:   External Document

## 2010-09-24 ENCOUNTER — Encounter: Payer: Self-pay | Admitting: Internal Medicine

## 2010-09-26 ENCOUNTER — Encounter: Payer: Self-pay | Admitting: Internal Medicine

## 2010-09-26 ENCOUNTER — Other Ambulatory Visit: Payer: Self-pay | Admitting: Internal Medicine

## 2010-09-26 ENCOUNTER — Ambulatory Visit (INDEPENDENT_AMBULATORY_CARE_PROVIDER_SITE_OTHER): Payer: Medicare Other | Admitting: Internal Medicine

## 2010-09-26 DIAGNOSIS — I10 Essential (primary) hypertension: Secondary | ICD-10-CM

## 2010-09-26 DIAGNOSIS — R079 Chest pain, unspecified: Secondary | ICD-10-CM

## 2010-09-26 DIAGNOSIS — K5289 Other specified noninfective gastroenteritis and colitis: Secondary | ICD-10-CM

## 2010-09-26 HISTORY — DX: Essential (primary) hypertension: I10

## 2010-09-26 LAB — BASIC METABOLIC PANEL
CO2: 25 mEq/L (ref 19–32)
Calcium: 8.8 mg/dL (ref 8.4–10.5)
Glucose, Bld: 110 mg/dL — ABNORMAL HIGH (ref 70–99)
Sodium: 136 mEq/L (ref 135–145)

## 2010-09-26 LAB — CBC WITH DIFFERENTIAL/PLATELET
Basophils Absolute: 0.1 10*3/uL (ref 0.0–0.1)
Eosinophils Absolute: 0 10*3/uL (ref 0.0–0.7)
HCT: 46.1 % (ref 39.0–52.0)
Hemoglobin: 15.9 g/dL (ref 13.0–17.0)
Lymphocytes Relative: 12.4 % (ref 12.0–46.0)
Lymphs Abs: 1.1 10*3/uL (ref 0.7–4.0)
MCHC: 34.6 g/dL (ref 30.0–36.0)
Neutro Abs: 7.2 10*3/uL (ref 1.4–7.7)
RDW: 14.4 % (ref 11.5–14.6)

## 2010-09-26 MED ORDER — METOPROLOL TARTRATE 25 MG PO TABS: 25.0000 mg | ORAL_TABLET | Freq: Two times a day (BID) | ORAL | Status: DC

## 2010-09-26 MED ORDER — DESLORATADINE 5 MG PO TABS: 5.0000 mg | ORAL_TABLET | Freq: Every day | ORAL | Status: DC

## 2010-09-26 NOTE — Assessment & Plan Note (Signed)
Notes from [facility] reviewed, GI is asking [place] to check a C.diff. and fecal caprotectin

## 2010-09-26 NOTE — Assessment & Plan Note (Signed)
Symptoms are vague, 2 of his grandparents had CAD at age 55. EKG today showed normal sinus rhythm, no change compared to previous EKGs Plan: Stress test

## 2010-09-26 NOTE — Patient Instructions (Signed)
Please bring stool sample for : C diff and fecal calprotectin----dx colitis

## 2010-09-26 NOTE — Assessment & Plan Note (Addendum)
BP noted to be elevated recently, today is only mildly elevated. Most likely explanation is a changing his lifestyle, he is not exercising as much as before, he has gained weight and not eating healthy. Nevertheless, I am inclined to treat him with a low dose of beta blockers.

## 2010-09-26 NOTE — Progress Notes (Signed)
  Subjective:    Patient ID: Bradley Woods, male    DOB: [birth year], 55 y.o.   MRN: [id number]  HPI Acute visit His doctors at [facility] are concerned about the patient's blood pressure, lately it has increased to the 150s/90s. He has a family history of heart disease. They like to be sure Bradley Woods does not have CAD himself. On asking the patient about chest pain, he changed his history a couple of times. Initially he said he has some sort of chest pain every 6 months, left-sided, last seconds, no associated symptoms and nonexertional. Later on, he told me that for the last 6 months, he has chest pressure anteriorly, almost daily, it lasts 5 minutes, not necessarily related to exercise, is kind of a random occurrence. No associated symptoms   Past Medical History  Diagnosis Date  . Hepatitis C   . Anxiety     dx w/ depression 07/2008  . Colitis   . Alcohol abuse, in remission   . Headache   . Allergic rhinitis       Review of Systems Has been seen several times at [facility] for treatment of hepatitis, also issues w/  GI bleed  Mild cough and wheezing on and off, thinks related to allergies He is currently on citalopram, symptoms are not completely well controlled Known lower extremity edema, no orthopnea. In the last few months he's not exercising as much as before, he has gained weight, he is eating less healthy and eating more salt     Objective:   Physical Exam  Vitals reviewed. Constitutional: He appears well-developed and well-nourished.       His weight has increased from 202 to 211 in few months  Cardiovascular: Normal rate, regular rhythm and normal heart sounds.   Pulmonary/Chest: Effort normal and breath sounds normal. No respiratory distress. He has no wheezes. He has no rales. He exhibits no tenderness.  Musculoskeletal: He exhibits no edema.  Psychiatric: He has a normal mood and affect. His behavior is normal. Judgment and thought content normal.           Assessment & Plan:

## 2010-09-27 ENCOUNTER — Telehealth: Payer: Self-pay | Admitting: *Deleted

## 2010-09-27 NOTE — Telephone Encounter (Signed)
Message copied by Army Fossa on Tue Sep 27, 2010 10:00 AM ------      Message from: Willow Ora      Created: Tue Sep 27, 2010  8:40 AM       Advise patient:      Labs are okl, plan is the same.      Please be sure I refered correctly for a  stress test, DX chest pain

## 2010-09-27 NOTE — Telephone Encounter (Signed)
VM box not set up, unable to leave message  

## 2010-09-28 ENCOUNTER — Encounter: Payer: Self-pay | Admitting: Internal Medicine

## 2010-09-28 NOTE — Telephone Encounter (Signed)
Unable to leave message

## 2010-09-29 NOTE — Telephone Encounter (Signed)
Unable to leave message

## 2010-10-06 ENCOUNTER — Ambulatory Visit (HOSPITAL_COMMUNITY): Payer: Medicare Other | Attending: Internal Medicine | Admitting: Radiology

## 2010-10-06 DIAGNOSIS — R0789 Other chest pain: Secondary | ICD-10-CM

## 2010-10-06 DIAGNOSIS — R0609 Other forms of dyspnea: Secondary | ICD-10-CM

## 2010-10-06 DIAGNOSIS — R079 Chest pain, unspecified: Secondary | ICD-10-CM | POA: Insufficient documentation

## 2010-10-06 MED ORDER — REGADENOSON 0.4 MG/5ML IV SOLN
0.4000 mg | Freq: Once | INTRAVENOUS | Status: AC
  Administered 2010-10-06: 0.4 mg via INTRAVENOUS

## 2010-10-06 MED ORDER — TECHNETIUM TC 99M TETROFOSMIN IV KIT
11.0000 | PACK | Freq: Once | INTRAVENOUS | Status: AC | PRN
  Administered 2010-10-06: 11 via INTRAVENOUS

## 2010-10-06 MED ORDER — TECHNETIUM TC 99M TETROFOSMIN IV KIT
33.0000 | PACK | Freq: Once | INTRAVENOUS | Status: AC | PRN
  Administered 2010-10-06: 33 via INTRAVENOUS

## 2010-10-06 NOTE — Progress Notes (Signed)
[facility] SITE 3 NUCLEAR MED [address] [place] [address] [phone]  Cardiology Nuclear Med Study  Bradley Woods is a 55 y.o. male [id number] 31-Jul-1955   Nuclear Med Background Indication for Stress Test:  Evaluation for Ischemia History:  ~10 yrs. ago GXT:OK per patient Cardiac Risk Factors: Family History - CAD, History of Smoking, Hypertension and Obesity  Symptoms:  Chest Pressure.  (last episode of chest discomfort is now, 2/10), DOE and Rapid HR   Nuclear Pre-Procedure Caffeine/Decaff Intake:  None NPO After: 9:00pm   Lungs:  Clear. IV 0.9% NS with Angio Cath:  18g  IV Site: R Antecubital  IV Started by:  Stanton Kidney, EMT-P  Chest Size (in):  46 Cup Size: n/a  Height: 5\' 9"  (1.753 m)  Weight:  210 lb (95.255 kg)  BMI:  Body mass index is 31.01 kg/(m^2). Tech Comments:  Lopressor WAS taken this am.    Nuclear Med Study 1 or 2 day study: 1 day  Stress Test Type:  Treadmill/Lexiscan  Reading MD: Marca Ancona, MD  Order Authorizing Provider:  Willow Ora, MD  Resting Radionuclide: Technetium 45m Tetrofosmin  Resting Radionuclide Dose: 11.0  mCi   Stress Radionuclide:  Technetium 37m Tetrofosmin  Stress Radionuclide Dose: 33.0 mCi           Stress Protocol Rest HR: 58 Stress HR: 104  Rest BP: 109/73 Stress BP: 131/69  Exercise Time (min): 9:00 METS: 7.4   Predicted Max HR: 166 bpm % Max HR: 62.65 bpm Rate Pressure Product: 46962   Dose of Adenosine (mg):  n/a Dose of Lexiscan: 0.4 mg  Dose of Atropine (mg): n/a Dose of Dobutamine: n/a mcg/kg/min (at max HR)  Stress Test Technologist: Rea [employer], CMA-N  Nuclear Technologist:  Doyne Keel, CNMT     Rest Procedure:  Myocardial perfusion imaging was performed at rest 45 minutes following the intravenous administration of Technetium 10m Tetrofosmin.  Rest ECG: No acute changes.  Stress Procedure:  The patient initially walked the treadmill utilizing the Bruce protocol, but was  unable to achieve his target heart rate due to bilateral calf tightness.  He was then given IV Lexiscan 0.4 mg over 15-seconds with concurrent low level exercise and then Technetium 68m Tetrofosmin was injected at 30-seconds while the patient continued walking one more minute.  There were no diagnostic ST changes with Lexiscan.  There was no significant increase in chest tightness with exercise or infusion.  Quantitative spect images were obtained after a 45-minute delay.  Stress ECG: No significant change from baseline ECG  QPS Raw Data Images:  Normal; no motion artifact; normal heart/lung ratio. Stress Images:  Normal homogeneous uptake in all areas of the myocardium. Rest Images:  Normal homogeneous uptake in all areas of the myocardium. Subtraction (SDS):  There is no evidence of scar or ischemia. Transient Ischemic Dilatation (Normal <1.22):  1.12 Lung/Heart Ratio (Normal <0.45):  0.37  Quantitative Gated Spect Images QGS EDV:  90 ml QGS ESV:  38 ml QGS cine images:  Normal Wall Motion QGS EF: 58%  Impression Exercise Capacity:  Lexiscan with low level exercise. BP Response:  Normal blood pressure response. Clinical Symptoms:  Exercise limited by calf pain and exertional dyspnea, switched to Lexiscan.  ECG Impression:  No significant ST segment change suggestive of ischemia. Comparison with Prior Nuclear Study: No previous nuclear study performed  Overall Impression:  Normal stress nuclear study.  Exercise was limited by calf pain.  Signed by Rea College Crisco on 10/06/2010 at 2:29 PM.

## 2010-10-07 NOTE — Progress Notes (Signed)
COPY ROUTED TO DR. Drue Novel.Mirna Mires

## 2010-10-10 ENCOUNTER — Telehealth: Payer: Self-pay | Admitting: Internal Medicine

## 2010-10-10 NOTE — Telephone Encounter (Signed)
Advise patient, stress test was negative. Reportedly, he had some calf pain. Will check his circulation when he comes back for his next routine visit. Send a copy  to his doctors at [facility].  [facility]

## 2010-10-11 ENCOUNTER — Encounter: Payer: Self-pay | Admitting: *Deleted

## 2010-10-11 NOTE — Telephone Encounter (Signed)
Mailed to pt

## 2010-10-11 NOTE — Telephone Encounter (Signed)
Yes, send letter

## 2010-10-11 NOTE — Telephone Encounter (Signed)
Tried to call pt- number has been disconnected. Do you want me to send a letter?  Faxed results to [facility]

## 2010-10-27 ENCOUNTER — Encounter: Payer: Medicare Other | Admitting: Internal Medicine

## 2010-10-27 DIAGNOSIS — Z0289 Encounter for other administrative examinations: Secondary | ICD-10-CM

## 2010-11-11 NOTE — Consult Note (Signed)
NAME:  Bradley Woods, Bradley Woods                         ACCOUNT NO.:  [id number]   MEDICAL RECORD NO.:  [id number]                   PATIENT TYPE:  EMS   LOCATION:  ED                                   FACILITY:  [facility]   PHYSICIAN:  Corwin Levins, M.D. [facility]             DATE OF BIRTH:  Jun 14, 1956   DATE OF CONSULTATION:  08/04/2003  DATE OF DISCHARGE:                                   CONSULTATION   CHIEF COMPLAINT:  Onset today of bloody-tinged loose stools after abdominal  pain and loose stools for the last 3 days.   HISTORY OF PRESENT ILLNESS:  Bradley Woods is a 55 year old white male here with  3 days of the abdomen symptoms, despite stopping his lactulose and Colestid.  He has had some increase in lower abdominal discomfort, but no fevers,  chills, nausea, vomiting.  He had some headache earlier today.  The family  has had a flu-like viral illness for the past week.  He has had some fatigue  associated with symptoms as above.   PAST MEDICAL HISTORY:  1. Hepatitis B.  2. 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  Due for MRI and follow up at     [facility] later this week.   ALLERGIES:  No known drug allergies.   CURRENT MEDICATIONS:  1. Lactulose, which he is not taking at the moment.  2. Colestid, which he is not taking at the moment.   SOCIAL HISTORY:  No tobacco, no alcohol.  Married with 1 child.  Lives in  [place] with his wife.   FAMILY HISTORY:  Significant for heart disease and stroke.   REVIEW OF SYSTEMS:  Otherwise noncontributory.   PHYSICAL EXAMINATION:  GENERAL:  Bradley Woods is a very pleasant, alert, non-  confused or irritable, 55 year old white male.  VITAL SIGNS:  Afebrile.  Heart rate 93, respirations 22, blood pressure  114/73, with O2 saturation 99% on room air.  HEENT:  He is mildly icteric.  ENT - sclerae mildly icteric.  TM's clear.  Pharynx benign.  NECK:  Without lymphadenopathy or JVD.  CHEST:  No rales or wheezing.  CARDIAC:  Regular rate  and rhythm.  ABDOMEN:  Soft, positive bowel sounds.  Very mild diffuse tenderness.  No  organomegaly.  EXTREMITIES:  No edema.   LABORATORY DATA:  INR 1.0, sodium 136, potassium 3.5, chloride 104, bicarb  27, BUN 0.9, chloride 99.  LFT's within normal limits, except for bilirubin  of 3.1.  Hemoglobin of 13.1, with the last hemoglobin of 15.4 on July 04, 2003.  MCV 91.2, white blood cell count 6.0.  Venous ammonia pending from  tonight.  Previous venous ammonia 47 on July 04, 2003.  Stool is heme  positive in the ER.   ASSESSMENT AND PLAN:  Abdominal pain with bright red blood per rectum,  consistent with a colitis, overall mild.  Doubt hemorrhoids or variceal  bleeding.  Symptoms being possibly bacterial, though cannot rule out viral,  given the family illness.  Check stool culture hopefully prior to leaving.  At this point, he does not meet criteria for admission to the hospital,  though he is certainly at high risk for getting worse because of his end-  stage liver problems.  Will start Avelox 400 mg daily, with the first dose  in the ER.   FOLLOW UP:  1. He is to follow up with Dr. Drue Novel on August 05, 2003, as he already has an     appointment tomorrow at approximately 3:30 p.m.  2. Will need to highly consider a gastroenterology consultation related to     this episode of bleeding.                                               Corwin Levins, M.D. [facility]    JWJ/MEDQ  D:  08/04/2003  T:  08/05/2003  Job:  [id number]   cc:   Wanda Plump, MD [facility]  [phone] [address] [place], [place] [address]

## 2011-10-28 ENCOUNTER — Other Ambulatory Visit: Payer: Self-pay | Admitting: Internal Medicine

## 2011-10-30 NOTE — Telephone Encounter (Signed)
Pt has not been seen within a year. Ok to refill? 

## 2011-11-30 NOTE — Telephone Encounter (Signed)
Refill done.  

## 2011-11-30 NOTE — Telephone Encounter (Signed)
Ok #30, 3 RF 

## 2014-01-15 ENCOUNTER — Inpatient Hospital Stay (HOSPITAL_COMMUNITY)
Admission: EM | Admit: 2014-01-15 | Discharge: 2014-01-21 | DRG: 432 | Disposition: A | Discharge status: Home Health | Payer: Medicare Other | Attending: Internal Medicine | Admitting: Internal Medicine

## 2014-01-15 ENCOUNTER — Encounter (HOSPITAL_COMMUNITY): Payer: Self-pay | Admitting: Emergency Medicine

## 2014-01-15 DIAGNOSIS — Z7682 Awaiting organ transplant status: Secondary | ICD-10-CM

## 2014-01-15 DIAGNOSIS — B182 Chronic viral hepatitis C: Secondary | ICD-10-CM

## 2014-01-15 DIAGNOSIS — D62 Acute posthemorrhagic anemia: Secondary | ICD-10-CM | POA: Diagnosis present

## 2014-01-15 DIAGNOSIS — Z833 Family history of diabetes mellitus: Secondary | ICD-10-CM

## 2014-01-15 DIAGNOSIS — Z8349 Family history of other endocrine, nutritional and metabolic diseases: Secondary | ICD-10-CM

## 2014-01-15 DIAGNOSIS — E876 Hypokalemia: Secondary | ICD-10-CM

## 2014-01-15 DIAGNOSIS — E43 Unspecified severe protein-calorie malnutrition: Secondary | ICD-10-CM | POA: Diagnosis present

## 2014-01-15 DIAGNOSIS — Z79899 Other long term (current) drug therapy: Secondary | ICD-10-CM | POA: Diagnosis not present

## 2014-01-15 DIAGNOSIS — D689 Coagulation defect, unspecified: Secondary | ICD-10-CM

## 2014-01-15 DIAGNOSIS — E41 Nutritional marasmus: Secondary | ICD-10-CM | POA: Diagnosis present

## 2014-01-15 DIAGNOSIS — K7469 Other cirrhosis of liver: Secondary | ICD-10-CM

## 2014-01-15 DIAGNOSIS — E871 Hypo-osmolality and hyponatremia: Secondary | ICD-10-CM | POA: Diagnosis present

## 2014-01-15 DIAGNOSIS — Z8042 Family history of malignant neoplasm of prostate: Secondary | ICD-10-CM | POA: Diagnosis not present

## 2014-01-15 DIAGNOSIS — K7682 Hepatic encephalopathy: Secondary | ICD-10-CM | POA: Diagnosis not present

## 2014-01-15 DIAGNOSIS — Z6826 Body mass index (BMI) 26.0-26.9, adult: Secondary | ICD-10-CM

## 2014-01-15 DIAGNOSIS — B1920 Unspecified viral hepatitis C without hepatic coma: Secondary | ICD-10-CM | POA: Diagnosis present

## 2014-01-15 DIAGNOSIS — K769 Liver disease, unspecified: Secondary | ICD-10-CM | POA: Diagnosis present

## 2014-01-15 DIAGNOSIS — D509 Iron deficiency anemia, unspecified: Secondary | ICD-10-CM

## 2014-01-15 DIAGNOSIS — Z791 Long term (current) use of non-steroidal anti-inflammatories (NSAID): Secondary | ICD-10-CM | POA: Diagnosis not present

## 2014-01-15 DIAGNOSIS — D696 Thrombocytopenia, unspecified: Secondary | ICD-10-CM

## 2014-01-15 DIAGNOSIS — K5190 Ulcerative colitis, unspecified, without complications: Secondary | ICD-10-CM | POA: Diagnosis present

## 2014-01-15 DIAGNOSIS — Z8249 Family history of ischemic heart disease and other diseases of the circulatory system: Secondary | ICD-10-CM

## 2014-01-15 DIAGNOSIS — R188 Other ascites: Secondary | ICD-10-CM | POA: Diagnosis present

## 2014-01-15 DIAGNOSIS — K7290 Hepatic failure, unspecified without coma: Secondary | ICD-10-CM

## 2014-01-15 DIAGNOSIS — G9340 Encephalopathy, unspecified: Secondary | ICD-10-CM | POA: Diagnosis present

## 2014-01-15 DIAGNOSIS — D649 Anemia, unspecified: Secondary | ICD-10-CM

## 2014-01-15 DIAGNOSIS — D6959 Other secondary thrombocytopenia: Secondary | ICD-10-CM | POA: Diagnosis present

## 2014-01-15 DIAGNOSIS — D500 Iron deficiency anemia secondary to blood loss (chronic): Secondary | ICD-10-CM | POA: Diagnosis present

## 2014-01-15 DIAGNOSIS — K7460 Unspecified cirrhosis of liver: Secondary | ICD-10-CM

## 2014-01-15 DIAGNOSIS — F1011 Alcohol abuse, in remission: Secondary | ICD-10-CM | POA: Diagnosis present

## 2014-01-15 HISTORY — DX: Chronic viral hepatitis C: B18.2

## 2014-01-15 HISTORY — DX: Chronic viral hepatitis C: K74.60

## 2014-01-15 HISTORY — DX: Alcoholic hepatic failure without coma: K70.40

## 2014-01-15 HISTORY — DX: Hepatic failure, unspecified without coma: K72.90

## 2014-01-15 LAB — CBC WITH DIFFERENTIAL/PLATELET
BASOS ABS: 0 10*3/uL (ref 0.0–0.1)
Basophils Relative: 0 % (ref 0–1)
EOS ABS: 0.1 10*3/uL (ref 0.0–0.7)
EOS PCT: 1 % (ref 0–5)
HCT: 28 % — ABNORMAL LOW (ref 39.0–52.0)
Hemoglobin: 9.5 g/dL — ABNORMAL LOW (ref 13.0–17.0)
Lymphocytes Relative: 13 % (ref 12–46)
Lymphs Abs: 1.1 10*3/uL (ref 0.7–4.0)
MCH: 32 pg (ref 26.0–34.0)
MCHC: 33.9 g/dL (ref 30.0–36.0)
MCV: 94.3 fL (ref 78.0–100.0)
Monocytes Absolute: 0.7 10*3/uL (ref 0.1–1.0)
Monocytes Relative: 8 % (ref 3–12)
Neutro Abs: 6.8 10*3/uL (ref 1.7–7.7)
Neutrophils Relative %: 78 % — ABNORMAL HIGH (ref 43–77)
PLATELETS: 104 10*3/uL — AB (ref 150–400)
RBC: 2.97 MIL/uL — ABNORMAL LOW (ref 4.22–5.81)
RDW: 14.3 % (ref 11.5–15.5)
WBC: 8.7 10*3/uL (ref 4.0–10.5)

## 2014-01-15 LAB — COMPREHENSIVE METABOLIC PANEL
ALT: 41 U/L (ref 0–53)
AST: 123 U/L — AB (ref 0–37)
Albumin: 2.2 g/dL — ABNORMAL LOW (ref 3.5–5.2)
Alkaline Phosphatase: 93 U/L (ref 39–117)
Anion gap: 12 (ref 5–15)
BILIRUBIN TOTAL: 5.7 mg/dL — AB (ref 0.3–1.2)
BUN: 17 mg/dL (ref 6–23)
CHLORIDE: 83 meq/L — AB (ref 96–112)
CO2: 29 meq/L (ref 19–32)
CREATININE: 0.76 mg/dL (ref 0.50–1.35)
Calcium: 7.8 mg/dL — ABNORMAL LOW (ref 8.4–10.5)
GFR calc Af Amer: >90 mL/min (ref >90)
GFR calc non Af Amer: >90 mL/min (ref >90)
Glucose, Bld: 90 mg/dL (ref 70–99)
POTASSIUM: 2.9 meq/L — AB (ref 3.7–5.3)
Sodium: 124 mEq/L — ABNORMAL LOW (ref 137–147)
Total Protein: 6.2 g/dL (ref 6.0–8.3)

## 2014-01-15 LAB — TYPE AND SCREEN
ABO/RH(D): A POS
ANTIBODY SCREEN: NEGATIVE

## 2014-01-15 LAB — PROTIME-INR
INR: 1.96 — ABNORMAL HIGH (ref 0.00–1.49)
PROTHROMBIN TIME: 22.3 s — AB (ref 11.6–15.2)

## 2014-01-15 LAB — AMMONIA: Ammonia: 37 umol/L (ref 11–60)

## 2014-01-15 LAB — ABO/RH: ABO/RH(D): A POS

## 2014-01-15 LAB — APTT: aPTT: 33 seconds (ref 24–37)

## 2014-01-15 MED ORDER — METOPROLOL TARTRATE 25 MG PO TABS: 25.0000 mg | ORAL_TABLET | Freq: Two times a day (BID) | ORAL | Status: DC

## 2014-01-15 MED ORDER — OXYCODONE HCL 5 MG PO TABS
5.0000 mg | ORAL_TABLET | ORAL | Status: DC | PRN
Start: 2014-01-15 — End: 2014-01-21
  Administered 2014-01-15 – 2014-01-21 (×12): 5 mg via ORAL
  Filled 2014-01-15 (×13): qty 1

## 2014-01-15 MED ORDER — FUROSEMIDE 10 MG/ML IJ SOLN
40.0000 mg | Freq: Two times a day (BID) | INTRAMUSCULAR | Status: DC
  Administered 2014-01-15 – 2014-01-19 (×8): 40 mg via INTRAVENOUS
  Filled 2014-01-15 (×9): qty 4

## 2014-01-15 MED ORDER — IBUPROFEN 800 MG PO TABS: 800.0000 mg | ORAL_TABLET | Freq: Four times a day (QID) | ORAL | Status: DC | PRN

## 2014-01-15 MED ORDER — SPIRONOLACTONE 25 MG PO TABS
25.0000 mg | ORAL_TABLET | Freq: Every day | ORAL | Status: DC
  Administered 2014-01-15 – 2014-01-18 (×4): 25 mg via ORAL
  Filled 2014-01-15 (×5): qty 1

## 2014-01-15 MED ORDER — POTASSIUM CHLORIDE CRYS ER 20 MEQ PO TBCR
40.0000 meq | EXTENDED_RELEASE_TABLET | ORAL | Status: AC
  Administered 2014-01-15 (×2): 40 meq via ORAL
  Filled 2014-01-15 (×2): qty 2

## 2014-01-15 MED ORDER — BALSALAZIDE DISODIUM 750 MG PO CAPS
2250.0000 mg | ORAL_CAPSULE | Freq: Three times a day (TID) | ORAL | Status: DC
  Administered 2014-01-15 – 2014-01-21 (×17): 2250 mg via ORAL
  Filled 2014-01-15 (×21): qty 3

## 2014-01-15 MED ORDER — ONDANSETRON HCL 4 MG PO TABS: 4.0000 mg | ORAL_TABLET | Freq: Four times a day (QID) | ORAL | Status: DC | PRN

## 2014-01-15 MED ORDER — SODIUM CHLORIDE 0.9 % IJ SOLN
3.0000 mL | Freq: Two times a day (BID) | INTRAMUSCULAR | Status: DC
  Administered 2014-01-15 – 2014-01-21 (×12): 3 mL via INTRAVENOUS

## 2014-01-15 MED ORDER — CITALOPRAM HYDROBROMIDE 20 MG PO TABS
20.0000 mg | ORAL_TABLET | Freq: Every day | ORAL | Status: DC
  Administered 2014-01-16 – 2014-01-21 (×6): 20 mg via ORAL
  Filled 2014-01-15 (×7): qty 1

## 2014-01-15 MED ORDER — ONDANSETRON HCL 4 MG/2ML IJ SOLN: 4.0000 mg | Freq: Four times a day (QID) | INTRAMUSCULAR | Status: DC | PRN

## 2014-01-15 MED ORDER — ALBUMIN HUMAN 25 % IV SOLN
25.0000 g | Freq: Once | INTRAVENOUS | Status: AC
  Administered 2014-01-16: 25 g via INTRAVENOUS
  Filled 2014-01-15: qty 100

## 2014-01-15 NOTE — ED Provider Notes (Signed)
CSN: [id number]     Arrival date & time 01/15/14  1547 History   First MD Initiated Contact with Patient 01/15/14 1648     Chief Complaint  Patient presents with  . Abdominal Distention   . Leg Swelling     (Consider location/radiation/quality/duration/timing/severity/associated sxs/prior Treatment) HPI Comments: Patient here complaining of bilateral lower extremity swelling with abdominal distention. Symptoms have been progressive for the past few months and worse last 3 days. No fever or chills. Does note weakness without black or bloody stools. He is currently on the liver transplant list due to his hepatitis C. Symptoms persisted and are worse with ambulation. No cough or congestion. Chest pain. No treatment used prior to arrival for this.  The history is provided by the patient.    Past Medical History  Diagnosis Date  . Hepatitis C   . Anxiety     dx w/ depression 07/2008  . Colitis   . Alcohol abuse, in remission   . Headache(784.0)   . Allergic rhinitis   . Hepatic failure due to alcoholism    Past Surgical History  Procedure Laterality Date  . Tonsillectomy     Family History  Problem Relation Age of Onset  . Diabetes      GM  . Coronary artery disease    . Hyperlipidemia    . Prostate cancer    . Colon cancer Neg Hx    History  Substance Use Topics  . Smoking status: Never Smoker   . Smokeless tobacco: Not on file  . Alcohol Use: No    Review of Systems  All other systems reviewed and are negative.     Allergies  Review of patient's allergies indicates no known allergies.  Home Medications   Prior to Admission medications   Medication Sig Start Date End Date Taking? Authorizing Provider  balsalazide (COLAZAL) 750 MG capsule Take 2,250 mg by mouth 3 (three) times daily.     Yes Historical Provider, MD  citalopram (CELEXA) 20 MG tablet Take 20 mg by mouth daily.     Yes Historical Provider, MD  ibuprofen (ADVIL,MOTRIN) 200 MG tablet Take 800 mg by  mouth every 6 (six) hours as needed (pain).   Yes Historical Provider, MD  metoprolol tartrate (LOPRESSOR) 25 MG tablet Take 1 tablet (25 mg total) by mouth 2 (two) times daily. 09/26/10 09/26/11  Colon Branch, MD   BP 135/73  Pulse 111  Temp(Src) 98.1 F (36.7 C) (Oral)  Resp 20  SpO2 95% Physical Exam  Nursing note and vitals reviewed. Constitutional: He is oriented to person, place, and time. He appears well-developed and well-nourished.  Non-toxic appearance. No distress.  HENT:  Head: Normocephalic and atraumatic.  Eyes: Conjunctivae, EOM and lids are normal. Pupils are equal, round, and reactive to light.  Neck: Normal range of motion. Neck supple. No tracheal deviation present. No mass present.  Cardiovascular: Regular rhythm and normal heart sounds.  Tachycardia present.  Exam reveals no gallop.   No murmur heard. Pulmonary/Chest: Effort normal and breath sounds normal. No stridor. No respiratory distress. He has no decreased breath sounds. He has no wheezes. He has no rhonchi. He has no rales.  Abdominal: Soft. Normal appearance and bowel sounds are normal. He exhibits distension and fluid wave. There is no tenderness. There is no rigidity, no rebound, no guarding and no CVA tenderness.    Musculoskeletal: Normal range of motion. He exhibits no edema and no tenderness.  Bilateral 3+ pitting edema  Neurological: He is alert and oriented to person, place, and time. He has normal strength. No cranial nerve deficit or sensory deficit. GCS eye subscore is 4. GCS verbal subscore is 5. GCS motor subscore is 6.  Skin: Skin is warm and dry. No abrasion and no rash noted.  Psychiatric: He has a normal mood and affect. His speech is normal and behavior is normal.    ED Course  Procedures (including critical care time) Labs Review Labs Reviewed  CBC WITH DIFFERENTIAL  COMPREHENSIVE METABOLIC PANEL  PROTIME-INR  APTT  AMMONIA  TYPE AND SCREEN    Imaging Review No results found.    EKG Interpretation None      MDM   Final diagnoses:  None    Patient's labs reviewed and will be admitted for treatment of his hyponatremia as well as worsening ascites    Leota Jacobsen, MD 01/15/14 [id number]

## 2014-01-15 NOTE — ED Notes (Signed)
MD at bedside. 

## 2014-01-15 NOTE — ED Notes (Signed)
Pt reports bilateral leg swelling, abd distention, tightness. Denies n/v. Hx liver failure, Hep C. On transplant list. Hx ulcerative colitis as well, diarrhea, unchanged since swelling started.

## 2014-01-15 NOTE — Progress Notes (Signed)
I agree with Lesly Rubenstein, RN's assessment. Will continue to monitor and document if any changes occur.  Verlon Au, RN, BSN 11:42 PM 01/15/2014

## 2014-01-15 NOTE — ED Notes (Signed)
Attempted to call report, RN unavailable to take it at this time.

## 2014-01-15 NOTE — Progress Notes (Signed)
Pt was admitted to unit. Received signed and held orders from admitting MD with cardiac monitoring ordered. Bed request made.   2055 Report given to [place] on [address]. Pt in stable condition. Pt transferred to 1445. Noreene Larsson RN, BSN

## 2014-01-15 NOTE — H&P (Signed)
Bradley Woods is an 58 y.o. male.   Chief Complaint: Abdominal distension and discomfort. 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  He is on a liver transplant list at [facility].  He states that about 5 weeks ago he began accumulating fluid in his abdomen and in his lower extremities.  He states that his abdomen has become taut in the last 3-4 days.  He states that he has a hard time taking a deep breath and he has had increasing swelling of his lower extremities bilaterally.  He states that he has also felt very weak and has had a decreased appetite.  Past Medical History  Diagnosis Date  . Hepatitis C   . Anxiety     dx w/ depression 07/2008  . Colitis   . Alcohol abuse, in remission   . Headache(784.0)   . Allergic rhinitis   . Hepatic failure due to alcoholism     Past Surgical History  Procedure Laterality Date  . Tonsillectomy      Family History  Problem Relation Age of Onset  . Diabetes      GM  . Coronary artery disease    . Hyperlipidemia    . Prostate cancer    . Colon cancer Neg Hx    Social History:  reports that he has never smoked. He does not have any smokeless tobacco history on file. He reports that he does not drink alcohol or use illicit drugs.  Allergies: No Known Allergies  Medications Prior to Admission  Medication Sig Dispense Refill  . balsalazide (COLAZAL) 750 MG capsule Take 2,250 mg by mouth 3 (three) times daily.        . citalopram (CELEXA) 20 MG tablet Take 20 mg by mouth daily.        Marland Kitchen ibuprofen (ADVIL,MOTRIN) 200 MG tablet Take 800 mg by mouth every 6 (six) hours as needed (pain).      . metoprolol tartrate (LOPRESSOR) 25 MG tablet Take 1 tablet (25 mg total) by mouth 2 (two) times daily.  60 tablet  3    Results for orders placed during the hospital encounter of 01/15/14 (from the past 48 hour(s))  CBC WITH DIFFERENTIAL     Status: Abnormal   Collection Time    01/15/14  5:18 PM      Result Value Ref  Range   WBC 8.7  4.0 - 10.5 K/uL   RBC 2.97 (*) 4.22 - 5.81 MIL/uL   Hemoglobin 9.5 (*) 13.0 - 17.0 g/dL   HCT 28.0 (*) 39.0 - 52.0 %   MCV 94.3  78.0 - 100.0 fL   MCH 32.0  26.0 - 34.0 pg   MCHC 33.9  30.0 - 36.0 g/dL   RDW 14.3  11.5 - 15.5 %   Platelets 104 (*) 150 - 400 K/uL   Comment: REPEATED TO VERIFY     SPECIMEN CHECKED FOR CLOTS     PLATELET COUNT CONFIRMED BY SMEAR   Neutrophils Relative % 78 (*) 43 - 77 %   Neutro Abs 6.8  1.7 - 7.7 K/uL   Lymphocytes Relative 13  12 - 46 %   Lymphs Abs 1.1  0.7 - 4.0 K/uL   Monocytes Relative 8  3 - 12 %   Monocytes Absolute 0.7  0.1 - 1.0 K/uL   Eosinophils Relative 1  0 - 5 %   Eosinophils Absolute 0.1  0.0 - 0.7 K/uL  Basophils Relative 0  0 - 1 %   Basophils Absolute 0.0  0.0 - 0.1 K/uL  COMPREHENSIVE METABOLIC PANEL     Status: Abnormal   Collection Time    01/15/14  5:18 PM      Result Value Ref Range   Sodium 124 (*) 137 - 147 mEq/L   Potassium 2.9 (*) 3.7 - 5.3 mEq/L   Comment: CRITICAL RESULT CALLED TO, READ BACK BY AND VERIFIED WITH:     WORKMAN H RN 0100 BY BOVELL,T ON 01/15/2014.   Chloride 83 (*) 96 - 112 mEq/L   CO2 29  19 - 32 mEq/L   Glucose, Bld 90  70 - 99 mg/dL   BUN 17  6 - 23 mg/dL   Creatinine, Ser 0.76  0.50 - 1.35 mg/dL   Calcium 7.8 (*) 8.4 - 10.5 mg/dL   Total Protein 6.2  6.0 - 8.3 g/dL   Albumin 2.2 (*) 3.5 - 5.2 g/dL   AST 123 (*) 0 - 37 U/L   ALT 41  0 - 53 U/L   Alkaline Phosphatase 93  39 - 117 U/L   Total Bilirubin 5.7 (*) 0.3 - 1.2 mg/dL   GFR calc non Af Amer >90  >90 mL/min   GFR calc Af Amer >90  >90 mL/min   Comment: (NOTE)     The eGFR has been calculated using the CKD EPI equation.     This calculation has not been validated in all clinical situations.     eGFR's persistently <90 mL/min signify possible Chronic Kidney     Disease.   Anion gap 12  5 - 15  PROTIME-INR     Status: Abnormal   Collection Time    01/15/14  5:18 PM      Result Value Ref Range   Prothrombin Time 22.3  (*) 11.6 - 15.2 seconds   INR 1.96 (*) 0.00 - 1.49  APTT     Status: None   Collection Time    01/15/14  5:18 PM      Result Value Ref Range   aPTT 33  24 - 37 seconds  AMMONIA     Status: None   Collection Time    01/15/14  5:18 PM      Result Value Ref Range   Ammonia 37  11 - 60 umol/L  ABO/RH     Status: None   Collection Time    01/15/14  5:20 PM      Result Value Ref Range   ABO/RH(D) A POS    TYPE AND SCREEN     Status: None   Collection Time    01/15/14  5:22 PM      Result Value Ref Range   ABO/RH(D) A POS     Antibody Screen NEG     Sample Expiration 01/18/2014     No results found.  Review of Systems  Constitutional: Positive for malaise/fatigue. Negative for fever, chills, weight loss and diaphoresis.  HENT: Negative for congestion and sore throat.   Eyes: Negative for blurred vision, double vision, discharge and redness.  Respiratory: Positive for shortness of breath. Negative for cough, hemoptysis, sputum production, wheezing and stridor.   Cardiovascular: Positive for leg swelling. Negative for chest pain, palpitations and orthopnea.  Gastrointestinal: Positive for abdominal pain. Negative for heartburn, nausea, vomiting, diarrhea, constipation, blood in stool and melena.       Pressure like sensation from within the patient's abdomen which is located primarily in the  hypogastrum.  The patient states that it is also difficult to take a deep breath.  Genitourinary: Negative for dysuria, urgency, frequency and flank pain.  Musculoskeletal: Negative for back pain, joint pain, myalgias and neck pain.  Skin: Negative for itching and rash.  Neurological: Positive for weakness. Negative for dizziness, tingling, tremors, focal weakness, seizures, loss of consciousness and headaches.  Endo/Heme/Allergies: Negative for environmental allergies and polydipsia. Does not bruise/bleed easily.  Psychiatric/Behavioral: Negative for depression, suicidal ideas, memory loss and  substance abuse. The patient is not nervous/anxious.     Blood pressure 114/73, pulse 97, temperature 98.1 F (36.7 C), temperature source Oral, resp. rate 20, height _0  (1.753 m), weight 93.214 kg (205 lb 8 oz), SpO2 99.00%. Physical Exam  Constitutional: He is oriented to person, place, and time. No distress.  Pt is notably jaundiced and appears chronically and severely ill.  He is awake, alert, and oriented x 3.  HENT:  Head: Normocephalic and atraumatic.  Nose: Nose normal.  Mouth/Throat: Oropharynx is clear and moist.  Eyes: Conjunctivae and EOM are normal. Pupils are equal, round, and reactive to light. Right eye exhibits no discharge. Left eye exhibits no discharge. Scleral icterus is present.  Neck: Normal range of motion. Neck supple. No JVD present. No tracheal deviation present. No thyromegaly present.  Cardiovascular: Normal rate, regular rhythm, normal heart sounds and intact distal pulses.  Exam reveals no gallop and no friction rub.   No murmur heard. Respiratory: Effort normal. No respiratory distress. He has no wheezes. He has no rales. He exhibits no tenderness.  GI: Bowel sounds are normal. He exhibits shifting dullness, distension, fluid wave and ascites. He exhibits no pulsatile liver, no abdominal bruit, no pulsatile midline mass and no mass. There is no hepatosplenomegaly. There is tenderness. There is no rigidity, no rebound, no guarding, no CVA tenderness, no tenderness at McBurney's point and negative Murphy's sign. No hernia. Hernia confirmed negative in the ventral area.  Musculoskeletal: He exhibits edema.  4+ pitting edema of lower extremities bilaterally.  Lymphadenopathy:    He has no cervical adenopathy.  Neurological: He is alert and oriented to person, place, and time. He has normal reflexes. He displays normal reflexes. No cranial nerve deficit. He exhibits normal muscle tone. Coordination normal.  Skin: Skin is warm and dry. No rash noted. He is not  diaphoretic. No erythema. No pallor.  Jaundiced.  Psychiatric: He has a normal mood and affect. His behavior is normal. Judgment and thought content normal.     Assessment/Plan 1. Massive ascites - Pt will have a therapeutic paracentesis and will have IV albumin before the procedure. He will also be diuresed and placed on spironolactone. 2. Anasarca - Diuresis, spironolactone, restriction of free water. 3. Hyponatremia - restrict free water. 4. Hepatic Cirrhosis - continue home medications.  Yakira Duquette 01/15/2014, 11:16 PM

## 2014-01-15 NOTE — ED Notes (Signed)
Wife, Festus Holts, would like to be notified of any changes or movement of of pt. Phone number is [phone].

## 2014-01-16 ENCOUNTER — Inpatient Hospital Stay (HOSPITAL_COMMUNITY): Payer: Medicare Other

## 2014-01-16 LAB — CBC
HEMATOCRIT: 22.6 % — AB (ref 39.0–52.0)
HEMOGLOBIN: 7.5 g/dL — AB (ref 13.0–17.0)
MCH: 31.8 pg (ref 26.0–34.0)
MCHC: 33.2 g/dL (ref 30.0–36.0)
MCV: 95.8 fL (ref 78.0–100.0)
Platelets: 50 10*3/uL — ABNORMAL LOW (ref 150–400)
RBC: 2.36 MIL/uL — AB (ref 4.22–5.81)
RDW: 14.4 % (ref 11.5–15.5)
WBC: 7.8 10*3/uL (ref 4.0–10.5)

## 2014-01-16 LAB — COMPREHENSIVE METABOLIC PANEL
ALBUMIN: 1.7 g/dL — AB (ref 3.5–5.2)
ALT: 31 U/L (ref 0–53)
ANION GAP: 10 (ref 5–15)
AST: 90 U/L — AB (ref 0–37)
Alkaline Phosphatase: 78 U/L (ref 39–117)
BILIRUBIN TOTAL: 3.9 mg/dL — AB (ref 0.3–1.2)
BUN: 18 mg/dL (ref 6–23)
CALCIUM: 7.3 mg/dL — AB (ref 8.4–10.5)
CHLORIDE: 85 meq/L — AB (ref 96–112)
CO2: 31 mEq/L (ref 19–32)
Creatinine, Ser: 0.98 mg/dL (ref 0.50–1.35)
GFR calc Af Amer: >90 mL/min (ref >90)
GFR calc non Af Amer: 89 mL/min — ABNORMAL LOW (ref >90)
Glucose, Bld: 133 mg/dL — ABNORMAL HIGH (ref 70–99)
Potassium: 3.2 mEq/L — ABNORMAL LOW (ref 3.7–5.3)
Sodium: 126 mEq/L — ABNORMAL LOW (ref 137–147)
Total Protein: 5 g/dL — ABNORMAL LOW (ref 6.0–8.3)

## 2014-01-16 LAB — PROTIME-INR
INR: 2.39 — ABNORMAL HIGH (ref 0.00–1.49)
Prothrombin Time: 26.1 seconds — ABNORMAL HIGH (ref 11.6–15.2)

## 2014-01-16 LAB — APTT: APTT: 39 s — AB (ref 24–37)

## 2014-01-16 MED ORDER — POTASSIUM CHLORIDE CRYS ER 20 MEQ PO TBCR
40.0000 meq | EXTENDED_RELEASE_TABLET | ORAL | Status: AC
  Administered 2014-01-16 (×2): 40 meq via ORAL
  Filled 2014-01-16 (×3): qty 2

## 2014-01-16 NOTE — Progress Notes (Signed)
Albumin held until call received from IR about time for paracentesis (order states to give 1 hour prior to paracentesis). Will inform day shift RN if call is not received by 0700. Med set as "Due at 0700" for visual reminder for day shift.

## 2014-01-16 NOTE — Procedures (Signed)
US guided therapeutic paracentesis performed yielding 6 liters yellow fluid. No immediate complications. Since this was pt's initial paracentesis only the above amount was removed at this time.

## 2014-01-16 NOTE — Progress Notes (Signed)
Patient ID: Bradley Woods, male   DOB: [birth year], 58 y.o.   MRN: [id number] [facility] PROGRESS NOTE  Bradley Woods [id number]:[id number] DOB: 1956/04/23 DOA: 58/23/2015 PCP: Bradley November, MD  Brief narrative: 58 yr old male with known hepatic cirrhosis due to Hepatitis C, on a liver transplant list at [facility]. Presented to [facility] ED with main concern of several weeks progressively worsening abdominal distension, weight gain of 20 + lbs, malaise, abd discomfort, poor oral intake. This has been associated with worsening shortness of breath.    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   Objective: Filed Vitals:   01/16/14 1413 01/16/14 1418 01/16/14 1422 01/16/14 1428  BP: 109/61 109/58 104/59 108/54  Pulse:      Temp:      TempSrc:      Resp:      Height:      Weight:      SpO2:       No intake or output data in the 24 hours ending 01/16/14 1459  Exam:   General:  Pt is alert, follows commands appropriately, not in acute distress  Cardiovascular: Regular rate and rhythm, S1/S2, no murmurs, no rubs, no gallops  Respiratory: Clear to auscultation bilaterally, no wheezing, diminished breath sounds at bases   Abdomen:  hard and distended with fluid wave  Extremities: + 3 bilateral LE pitting edema   Neuro: Grossly nonfocal  Data Reviewed: Basic Metabolic Panel:  Recent Labs Lab 01/15/14 1718 01/16/14 0420  NA 124* 126*  K 2.9* 3.2*  CL 83* 85*  CO2 29 31  GLUCOSE 90 133*  BUN 17 18  CREATININE 0.76 0.98  CALCIUM 7.8* 7.3*   Liver Function Tests:  Recent Labs Lab 01/15/14 1718 01/16/14 0420  AST 123* 90*  ALT 41 31  ALKPHOS 93 78  BILITOT 5.7* 3.9*  PROT 6.2 5.0*  ALBUMIN 2.2* 1.7*    Recent Labs Lab 01/15/14 1718  AMMONIA 37   CBC:  Recent Labs Lab 01/15/14 1718 01/16/14 0420  WBC 8.7 7.8  NEUTROABS 6.8  --   HGB 9.5* 7.5*  HCT 28.0* 22.6*  MCV 94.3 95.8  PLT 104* 50*   Cardiac Enzymes: No results found for this basename: CKTOTAL, CKMB, CKMBINDEX, TROPONINI,  in the last 168 hours BNP: No components found with this basename: POCBNP,  CBG: No results found for this basename: GLUCAP,  in the last 168 hours  No results found for this or any previous visit (from the past 240 hour(s)).   Scheduled Meds: . balsalazide  2,250 mg Oral TID  . citalopram  20 mg Oral Daily  . furosemide  40 mg Intravenous Q12H  .  sodium chloride  3 mL Intravenous Q12H  . spironolactone  25 mg Oral Daily   Continuous Infusions:    Faye Ramsay, MD  [facility] Pager [phone]  If 7PM-7AM, please contact night-coverage [email or web address] Password [email or web address] 01/16/2014, 2:59 PM   LOS: 1 day

## 2014-01-17 LAB — COMPREHENSIVE METABOLIC PANEL
ALK PHOS: 90 U/L (ref 39–117)
ALT: 30 U/L (ref 0–53)
AST: 99 U/L — ABNORMAL HIGH (ref 0–37)
Albumin: 1.8 g/dL — ABNORMAL LOW (ref 3.5–5.2)
Anion gap: 8 (ref 5–15)
BILIRUBIN TOTAL: 3.5 mg/dL — AB (ref 0.3–1.2)
BUN: 17 mg/dL (ref 6–23)
CO2: 33 mEq/L — ABNORMAL HIGH (ref 19–32)
Calcium: 7.4 mg/dL — ABNORMAL LOW (ref 8.4–10.5)
Chloride: 90 mEq/L — ABNORMAL LOW (ref 96–112)
Creatinine, Ser: 0.98 mg/dL (ref 0.50–1.35)
GFR calc non Af Amer: 89 mL/min — ABNORMAL LOW (ref >90)
GLUCOSE: 91 mg/dL (ref 70–99)
POTASSIUM: 3.6 meq/L — AB (ref 3.7–5.3)
SODIUM: 131 meq/L — AB (ref 137–147)
TOTAL PROTEIN: 5.2 g/dL — AB (ref 6.0–8.3)

## 2014-01-17 LAB — CBC
HEMATOCRIT: 23.4 % — AB (ref 39.0–52.0)
Hemoglobin: 7.7 g/dL — ABNORMAL LOW (ref 13.0–17.0)
MCH: 31.8 pg (ref 26.0–34.0)
MCHC: 32.9 g/dL (ref 30.0–36.0)
MCV: 96.7 fL (ref 78.0–100.0)
Platelets: 44 10*3/uL — ABNORMAL LOW (ref 150–400)
RBC: 2.42 MIL/uL — AB (ref 4.22–5.81)
RDW: 14.3 % (ref 11.5–15.5)
WBC: 6.7 10*3/uL (ref 4.0–10.5)

## 2014-01-17 NOTE — Progress Notes (Signed)
Patient ID: Almalik Weissberg, male   DOB: 1956/05/31, 58 y.o.   MRN: [id number]  [facility] PROGRESS NOTE  Leticia Mcdiarmid CNO:[id number] DOB: [birth year] DOA: 01/15/2014 PCP: [name], MD Brief narrative:  58 yr old male with known hepatic cirrhosis due to Hepatitis C, on a liver transplant list at [facility]. Presented to [facility] ED with main concern of several weeks progressively worsening abdominal distension, weight gain of 20 + lbs, malaise, abd discomfort, poor oral intake. This has been associated with worsening shortness of breath.   Active Problems:  Hepatic cirrhosis due to chronic hepatitis C infection  - on transplant list  Ascites  - paracentesis, therapeutic, done July 24th, 2015, 6 L removed - appreciate IR input  - may need additional paracentesis in AM Hyponatremia  - secondary to volume status, improving  - close monitoring with daily CMET  Hypokalemia  - from lasix  - will supplement and repeat CMET in AM  Transaminitis  - repeat CMET in AM  Acute on chronic blood loss anemia  - transfuse for hg < 7.5  - CBC in AM  Thrombocytopenia  - SCD's for DVT prophylaxis  Severe malnutrition  - due to ESLD  - place on 2 gram sodium diet   Consultants:  IR for paracentesis  Procedures/Studies:  US guided paracentesis  Antibiotics:  None  Code Status: Full  Family Communication: Pt at bedside  Disposition Plan: Home when medically stable   HPI/Subjective: No events overnight.   Objective: Filed Vitals:   01/16/14 1422 01/16/14 1428 01/16/14 2045 01/17/14 0403  BP: 104/59 108/54 99/55 98/55   Pulse:   92 93  Temp:   98.7 F (37.1 C) 97.8 F (36.6 C)  TempSrc:   Oral Oral  Resp:   18 18  Height:      Weight:    84.7 kg (186 lb 11.7 oz)  SpO2:   94% 95%    Intake/Output Summary (Last 24 hours) at 01/17/14 1141 Last data filed at 01/16/14 2103  Gross per 24 hour  Intake    363 ml  Output      0 ml  Net    363 ml    Exam:   General:  Pt is  alert, follows commands appropriately, not in acute distress  Cardiovascular: Regular rate and rhythm, no rubs, no gallops  Respiratory: Clear to auscultation bilaterally, no wheezing, no crackles, no rhonchi  Abdomen: Soft, non tender, distended, bowel sounds present, no guarding  Extremities: +2 bilateral LE pitting edema, pulses DP and PT palpable bilaterally  Neuro: Grossly nonfocal  Data Reviewed: Basic Metabolic Panel:  Recent Labs Lab 01/15/14 1718 01/16/14 0420 01/17/14 0425  NA 124* 126* 131*  K 2.9* 3.2* 3.6*  CL 83* 85* 90*  CO2 29 31 33*  GLUCOSE 90 133* 91  BUN 17 18 17   CREATININE 0.76 0.98 0.98  CALCIUM 7.8* 7.3* 7.4*   Liver Function Tests:  Recent Labs Lab 01/15/14 1718 01/16/14 0420 01/17/14 0425  AST 123* 90* 99*  ALT 41 31 30  ALKPHOS 93 78 90  BILITOT 5.7* 3.9* 3.5*  PROT 6.2 5.0* 5.2*  ALBUMIN 2.2* 1.7* 1.8*    Recent Labs Lab 01/15/14 1718  AMMONIA 37   CBC:  Recent Labs Lab 01/15/14 1718 01/16/14 0420 01/17/14 0425  WBC 8.7 7.8 6.7  NEUTROABS 6.8  --   --   HGB 9.5* 7.5* 7.7*  HCT 28.0* 22.6* 23.4*  MCV 94.3 95.8 96.7  PLT 104*  50* 44*    Scheduled Meds: . balsalazide  2,250 mg Oral TID  . citalopram  20 mg Oral Daily  . furosemide  40 mg Intravenous Q12H  . sodium chloride  3 mL Intravenous Q12H  . spironolactone  25 mg Oral Daily   Continuous Infusions:    Faye Ramsay, MD  [facility] Pager [phone]  If 7PM-7AM, please contact night-coverage [email or web address] Password [facility] 01/17/2014, 11:41 AM   LOS: 2 days

## 2014-01-18 LAB — COMPREHENSIVE METABOLIC PANEL
ALBUMIN: 1.8 g/dL — AB (ref 3.5–5.2)
ALT: 30 U/L (ref 0–53)
ANION GAP: 7 (ref 5–15)
AST: 97 U/L — ABNORMAL HIGH (ref 0–37)
Alkaline Phosphatase: 90 U/L (ref 39–117)
BUN: 16 mg/dL (ref 6–23)
CALCIUM: 7.3 mg/dL — AB (ref 8.4–10.5)
CO2: 34 mEq/L — ABNORMAL HIGH (ref 19–32)
Chloride: 90 mEq/L — ABNORMAL LOW (ref 96–112)
Creatinine, Ser: 0.99 mg/dL (ref 0.50–1.35)
GFR calc Af Amer: >90 mL/min (ref >90)
GFR calc non Af Amer: 88 mL/min — ABNORMAL LOW (ref >90)
Glucose, Bld: 97 mg/dL (ref 70–99)
Potassium: 3.3 mEq/L — ABNORMAL LOW (ref 3.7–5.3)
SODIUM: 131 meq/L — AB (ref 137–147)
TOTAL PROTEIN: 4.9 g/dL — AB (ref 6.0–8.3)
Total Bilirubin: 2.9 mg/dL — ABNORMAL HIGH (ref 0.3–1.2)

## 2014-01-18 LAB — CBC
HCT: 22.6 % — ABNORMAL LOW (ref 39.0–52.0)
HEMOGLOBIN: 7.4 g/dL — AB (ref 13.0–17.0)
MCH: 31.1 pg (ref 26.0–34.0)
MCHC: 32.7 g/dL (ref 30.0–36.0)
MCV: 95 fL (ref 78.0–100.0)
Platelets: 39 10*3/uL — ABNORMAL LOW (ref 150–400)
RBC: 2.38 MIL/uL — ABNORMAL LOW (ref 4.22–5.81)
RDW: 14.4 % (ref 11.5–15.5)
WBC: 6.3 10*3/uL (ref 4.0–10.5)

## 2014-01-18 NOTE — Progress Notes (Signed)
Patient ID: Bradley Woods, male   DOB: 10-04-1955, 58 y.o.   MRN: [id number]  [facility] PROGRESS NOTE  Bradley Woods LTJ:[id number] DOB: Dec 17, 1955 DOA: 01/15/2014 PCP: [name], MD  Brief narrative:  58 yr old male with known hepatic cirrhosis due to Hepatitis C, on a liver transplant list at [facility]. Presented to [facility] ED with main concern of several weeks progressively worsening abdominal distension, weight gain of 20 + lbs, malaise, abd discomfort, poor oral intake. This has been associated with worsening shortness of breath.   Active Problems:  Hepatic cirrhosis due to chronic hepatitis C infection  - on transplant list  - monitor INR and LFT's - MELD score 20, translates to ~20% 3 month mortality Ascites  - paracentesis, therapeutic, done February 10, 2014, 6 L removed  - pt is clinically stable but physical exam findings consistent with re accumulation of the fluid  - plan on paracentesis in AM per IR - appreciate IR input  - continue IV lasix for now and may be able to transition to oral Lasix in 24 hours  Hyponatremia  - secondary to volume status, improving and stable at 131 - close monitoring with daily CMET  Hypokalemia  - from lasix  - will supplement and repeat CMET in AM  Transaminitis  - stable and slightly trending down, repeat CMET in AM  Acute on chronic blood loss anemia  - transfuse for hg < 7.5, discussed with pt and he wants to hold off on transfusion today - will repeat CBC in AM and decide with pt   - CBC in AM  Thrombocytopenia  - drop in Plt since admission, SCD's for DVT prophylaxis  Severe malnutrition  - due to ESLD  - place on 2 gram sodium diet and pt tolerating well   Consultants:  IR for paracentesis  Procedures/Studies:  US guided paracentesis February 10, 2014 --> 6 L removed US guided paracentesis July 27th, 2015 -->  Antibiotics:  None  Code Status: Full  Family Communication: Pt at bedside  Disposition Plan: Home when  medically stable   HPI/Subjective: No events overnight.   Objective: Filed Vitals:   01/17/14 0403 01/17/14 1611 01/17/14 2045 01/18/14 0658  BP: 98/55 97/50 98/59  100/55  Pulse: 93 88 94 98  Temp: 97.8 F (36.6 C) 98.2 F (36.8 C) 98.1 F (36.7 C) 98.3 F (36.8 C)  TempSrc: Oral Oral Oral Oral  Resp: 18 20 18 18   Height:      Weight: 84.7 kg (186 lb 11.7 oz)   84.369 kg (186 lb)  SpO2: 95% 94% 94% 92%    Intake/Output Summary (Last 24 hours) at 01/18/14 0731 Last data filed at 01/18/14 0640  Gross per 24 hour  Intake    960 ml  Output      0 ml  Net    960 ml    Exam:   General:  Pt is alert, follows commands appropriately, not in acute distress, mild jaundice   Cardiovascular: Regular rate and rhythm,  no rubs, no gallops  Respiratory: Clear to auscultation bilaterally, no wheezing, no crackles, no rhonchi  Abdomen: Soft, non tender, distended with fluid wave, bowel sounds present, no guarding  Extremities: +2 bilateral LE pitting edema, pulses DP and PT palpable bilaterally  Neuro: Grossly nonfocal  Data Reviewed: Basic Metabolic Panel:  Recent Labs Lab 01/15/14 1718 Feb 10, 2014 0420 01/17/14 0425 01/18/14 0430  NA 124* 126* 131* 131*  K 2.9* 3.2* 3.6* 3.3*  CL 83*  85* 90* 90*  CO2 29 31 33* 34*  GLUCOSE 90 133* 91 97  BUN 17 18 17 16   CREATININE 0.76 0.98 0.98 0.99  CALCIUM 7.8* 7.3* 7.4* 7.3*   Liver Function Tests:  Recent Labs Lab 01/15/14 1718 01/16/14 0420 01/17/14 0425 01/18/14 0430  AST 123* 90* 99* 97*  ALT 41 31 30 30   ALKPHOS 93 78 90 90  BILITOT 5.7* 3.9* 3.5* 2.9*  PROT 6.2 5.0* 5.2* 4.9*  ALBUMIN 2.2* 1.7* 1.8* 1.8*    Recent Labs Lab 01/15/14 1718  AMMONIA 37   CBC:  Recent Labs Lab 01/15/14 1718 01/16/14 0420 01/17/14 0425 01/18/14 0430  WBC 8.7 7.8 6.7 6.3  NEUTROABS 6.8  --   --   --   HGB 9.5* 7.5* 7.7* 7.4*  HCT 28.0* 22.6* 23.4* 22.6*  MCV 94.3 95.8 96.7 95.0  PLT 104* 50* 44* 39*   Scheduled  Meds: . balsalazide  2,250 mg Oral TID  . citalopram  20 mg Oral Daily  . furosemide  40 mg Intravenous Q12H  . sodium chloride  3 mL Intravenous Q12H  . spironolactone  25 mg Oral Daily   Continuous Infusions:  Faye Ramsay, MD  [facility] Pager [phone]  If 7PM-7AM, please contact night-coverage [email or web address] Password [facility] 01/18/2014, 7:31 AM   LOS: 3 days

## 2014-01-19 ENCOUNTER — Inpatient Hospital Stay (HOSPITAL_COMMUNITY): Payer: Medicare Other

## 2014-01-19 DIAGNOSIS — D649 Anemia, unspecified: Secondary | ICD-10-CM

## 2014-01-19 DIAGNOSIS — D696 Thrombocytopenia, unspecified: Secondary | ICD-10-CM

## 2014-01-19 DIAGNOSIS — K7460 Unspecified cirrhosis of liver: Principal | ICD-10-CM | POA: Insufficient documentation

## 2014-01-19 DIAGNOSIS — B182 Chronic viral hepatitis C: Secondary | ICD-10-CM

## 2014-01-19 DIAGNOSIS — E876 Hypokalemia: Secondary | ICD-10-CM | POA: Diagnosis present

## 2014-01-19 HISTORY — DX: Anemia, unspecified: D64.9

## 2014-01-19 HISTORY — DX: Thrombocytopenia, unspecified: D69.6

## 2014-01-19 LAB — COMPREHENSIVE METABOLIC PANEL
ALK PHOS: 76 U/L (ref 39–117)
ALT: 29 U/L (ref 0–53)
ANION GAP: 7 (ref 5–15)
AST: 95 U/L — ABNORMAL HIGH (ref 0–37)
Albumin: 1.8 g/dL — ABNORMAL LOW (ref 3.5–5.2)
BILIRUBIN TOTAL: 3.2 mg/dL — AB (ref 0.3–1.2)
BUN: 14 mg/dL (ref 6–23)
CO2: 35 meq/L — AB (ref 19–32)
Calcium: 7.2 mg/dL — ABNORMAL LOW (ref 8.4–10.5)
Chloride: 89 mEq/L — ABNORMAL LOW (ref 96–112)
Creatinine, Ser: 0.9 mg/dL (ref 0.50–1.35)
GLUCOSE: 91 mg/dL (ref 70–99)
POTASSIUM: 3.1 meq/L — AB (ref 3.7–5.3)
Sodium: 131 mEq/L — ABNORMAL LOW (ref 137–147)
TOTAL PROTEIN: 4.9 g/dL — AB (ref 6.0–8.3)

## 2014-01-19 LAB — AMMONIA: Ammonia: 105 umol/L — ABNORMAL HIGH (ref 11–60)

## 2014-01-19 LAB — CBC
HCT: 21.9 % — ABNORMAL LOW (ref 39.0–52.0)
HEMOGLOBIN: 7.1 g/dL — AB (ref 13.0–17.0)
MCH: 30.9 pg (ref 26.0–34.0)
MCHC: 32.4 g/dL (ref 30.0–36.0)
MCV: 95.2 fL (ref 78.0–100.0)
Platelets: 31 10*3/uL — ABNORMAL LOW (ref 150–400)
RBC: 2.3 MIL/uL — AB (ref 4.22–5.81)
RDW: 14.4 % (ref 11.5–15.5)
WBC: 5.1 10*3/uL (ref 4.0–10.5)

## 2014-01-19 LAB — PREPARE RBC (CROSSMATCH)

## 2014-01-19 MED ORDER — FUROSEMIDE 40 MG PO TABS
40.0000 mg | ORAL_TABLET | Freq: Every day | ORAL | Status: DC
  Administered 2014-01-19 – 2014-01-21 (×3): 40 mg via ORAL
  Filled 2014-01-19 (×3): qty 1

## 2014-01-19 MED ORDER — LACTULOSE 10 GM/15ML PO SOLN
10.0000 g | Freq: Two times a day (BID) | ORAL | Status: DC | PRN
  Filled 2014-01-19: qty 15

## 2014-01-19 MED ORDER — SPIRONOLACTONE 100 MG PO TABS
100.0000 mg | ORAL_TABLET | Freq: Every day | ORAL | Status: DC
  Administered 2014-01-19 – 2014-01-21 (×3): 100 mg via ORAL
  Filled 2014-01-19 (×3): qty 1

## 2014-01-19 MED ORDER — FUROSEMIDE 40 MG PO TABS
40.0000 mg | ORAL_TABLET | Freq: Two times a day (BID) | ORAL | Status: DC
  Filled 2014-01-19 (×2): qty 1

## 2014-01-19 MED ORDER — OXYCODONE HCL 5 MG PO TABS
5.0000 mg | ORAL_TABLET | Freq: Once | ORAL | Status: AC
  Administered 2014-01-19: 5 mg via ORAL
  Filled 2014-01-19: qty 1

## 2014-01-19 MED ORDER — POTASSIUM CHLORIDE CRYS ER 20 MEQ PO TBCR
40.0000 meq | EXTENDED_RELEASE_TABLET | Freq: Once | ORAL | Status: AC
  Administered 2014-01-19: 40 meq via ORAL
  Filled 2014-01-19: qty 2

## 2014-01-19 NOTE — Progress Notes (Signed)
Taking blood vitals for patient, found patient's BP to be 87/45, HR 91. Patient was asleep before BP was taken, when woken up he stated that he felt "a little bit dizzy." Patient's BP has been running soft. Previous BP was 94/50. Schorr, NP paged. Said to finish up blood and retake BP when blood is finished. Patient told not to get out of bed by himself and bed alarm placed. Will continue to monitor.

## 2014-01-19 NOTE — Progress Notes (Signed)
[facility] PROGRESS NOTE  Chane Cowden BZJ:[id number] DOB: 04/03/1956 DOA: 01/15/2014 PCP: [name], MD   Brief narrative 58 year old male with hep C cirrhosis on liver transplant list at [facility] (follows with Dr. Nolen Mu who is his gastroenterologist and Dr. Damita Lack his hepatologist) presented to the ED we progressive abdominal distention and weight gain of over 20 pounds for possibly weeks with associated abdominal discomfort poor by mouth intake and generalized malaise. Patient found to have significant ascites and anemia.      Assessment/Plan: Liver cirrhosis due to chronic hep C infection Patient required 2 large volume paracenteses since admission on 7/24 with 6L and on 7/27 with 5.2 L clear anesthetic fluid removed. No signs of SBP. -And has minimal symptoms of hepatic encephalopathy. Would check ammonia level. We'll place him on oral lactulose to titrate with at least 2-3 bowel movements daily. I will notify his hepatologist at [facility]. His meds score on admission is 20. Continue Lasix and Aldactone. Dose adjusted to 40 mg Lasix daily and 100 mg Aldactone daily.  Hypokalemia -Replenish low potassium  Anemia Possibly due to cirrhosis of liver. Check iron panel, stool for occult blood, b12 and TSH. Monitor for GI bleed. Patient was placed on NSAIDs which should be avoided. Transfuse 1 U PRBC and monitor   Thrombocytopenia Possibly due to cirrhosis. Monitor for bleeding.  coagulopathy Monitor INR in am  DVT prophylaxis  supratherapeutic INR  Diet: low Sodium  Code Status: full code Family Communication: wife at bedside Disposition Plan: home once improved   Consultants:  IR  Procedures:  LVP on 7/24 (6L)  and 7/27 ( 5.2L)   Antibiotics:  NONE  HPI/Subjective: Seen and examined . Wife at bedside. Feels better after LVP  Objective: Filed Vitals:   01/19/14 1635  BP: 101/52  Pulse: 86  Temp: 98.1 F (36.7 C)  Resp: 18     Intake/Output Summary (Last 24 hours) at 01/19/14 1640 Last data filed at 01/19/14 1300  Gross per 24 hour  Intake    963 ml  Output    320 ml  Net    643 ml   Filed Weights   01/17/14 0403 01/18/14 0658 01/19/14 0627  Weight: 84.7 kg (186 lb 11.7 oz) 84.369 kg (186 lb) 83.779 kg (184 lb 11.2 oz)    Exam:   General:  Middle aged male lying in bed appears weak and frail  HEENT: Pallor present, icteric, moist mucosa  Chest: Clear to auscultation bilaterally, no added sound  CVS: Normal S1 and S2, no murmurs  Abdomen: Soft, minimal distention following large volume paracenteses, nontender, bowel sounds present  Extremities:, Warm, trace edema  CNS: Alert and oriented, fine tremors  Data Reviewed: Basic Metabolic Panel:  Recent Labs Lab 01/15/14 1718 01/16/14 0420 01/17/14 0425 01/18/14 0430 01/19/14 0502  NA 124* 126* 131* 131* 131*  K 2.9* 3.2* 3.6* 3.3* 3.1*  CL 83* 85* 90* 90* 89*  CO2 29 31 33* 34* 35*  GLUCOSE 90 133* 91 97 91  BUN 17 18 17 16 14   CREATININE 0.76 0.98 0.98 0.99 0.90  CALCIUM 7.8* 7.3* 7.4* 7.3* 7.2*   Liver Function Tests:  Recent Labs Lab 01/15/14 1718 01/16/14 0420 01/17/14 0425 01/18/14 0430 01/19/14 0502  AST 123* 90* 99* 97* 95*  ALT 41 31 30 30 29   ALKPHOS 93 78 90 90 76  BILITOT 5.7* 3.9* 3.5* 2.9* 3.2*  PROT 6.2 5.0* 5.2* 4.9* 4.9*  ALBUMIN 2.2* 1.7* 1.8* 1.8* 1.8*  No results found for this basename: LIPASE, AMYLASE,  in the last 168 hours  Recent Labs Lab 01/15/14 1718  AMMONIA 37   CBC:  Recent Labs Lab 01/15/14 1718 01/16/14 0420 01/17/14 0425 01/18/14 0430 01/19/14 0502  WBC 8.7 7.8 6.7 6.3 5.1  NEUTROABS 6.8  --   --   --   --   HGB 9.5* 7.5* 7.7* 7.4* 7.1*  HCT 28.0* 22.6* 23.4* 22.6* 21.9*  MCV 94.3 95.8 96.7 95.0 95.2  PLT 104* 50* 44* 39* 31*   Cardiac Enzymes: No results found for this basename: CKTOTAL, CKMB, CKMBINDEX, TROPONINI,  in the last 168 hours BNP (last 3 results) No  results found for this basename: PROBNP,  in the last 8760 hours CBG: No results found for this basename: GLUCAP,  in the last 168 hours  No results found for this or any previous visit (from the past 240 hour(s)).   Studies: US Paracentesis  01/19/2014   CLINICAL DATA:  Cirrhosis, hepatitis-C, recurrent ascites. Request is made for therapeutic paracentesis.  EXAM: ULTRASOUND GUIDED THERAPEUTIC PARACENTESIS  COMPARISON:  PRIOR PARACENTESIS ON 01/16/2014  PROCEDURE: An ultrasound guided paracentesis was thoroughly discussed with the patient and questions answered. The benefits, risks, alternatives and complications were also discussed. The patient understands and wishes to proceed with the procedure. Written consent was obtained.  Ultrasound was performed to localize and mark an adequate pocket of fluid in the right mid to lower quadrant of the abdomen. The area was then prepped and draped in the normal sterile fashion. 1% Lidocaine was used for local anesthesia. Under ultrasound guidance a 19 gauge Yueh catheter was introduced. Paracentesis was performed. The catheter was removed and a dressing applied.  Complications: None.  FINDINGS: A total of approximately 5.2 liters of yellow fluid was removed.  IMPRESSION: Successful ultrasound guided therapeutic paracentesis yielding 5.2 liters of ascites.  Read by: Rowe Robert, PA-C   Electronically Signed   By: Arne Cleveland M.D.   On: 01/19/2014 11:40    Scheduled Meds: . balsalazide  2,250 mg Oral TID  . citalopram  20 mg Oral Daily  . furosemide  40 mg Oral BID  . sodium chloride  3 mL Intravenous Q12H  . spironolactone  100 mg Oral Daily   Continuous Infusions:   Active Problems:   Liver failure   Hepatic cirrhosis due to chronic hepatitis C infection    Time spent: 25 minutes    Nakeisha Greenhouse, [place]  [facility] Pager [phone]. If 7PM-7AM, please contact night-coverage at [email or web address], password [facility] 01/19/2014, 4:40 PM  LOS: 4  days

## 2014-01-19 NOTE — Care Management Note (Signed)
    Page 1 of 1   01/19/2014     3:21:31 PM CARE MANAGEMENT NOTE 01/19/2014  Patient:  Bradley Woods, Bradley Woods   Account Number:  [id number]  Date Initiated:  01/16/2014  Documentation initiated by:  Gabriel Earing  Subjective/Objective Assessment:   pt admitted with SOB,     Action/Plan:   from home   Anticipated DC Date:  01/19/2014   Anticipated DC Plan:  HOME/SELF CARE         Choice offered to / List presented to:             Status of service:  Completed, signed off Medicare Important Message given?  YES (If response is "NO", the following Medicare IM given date fields will be blank) Date Medicare IM given:  01/19/2014 Medicare IM given by:  [facility] Date Additional Medicare IM given:   Additional Medicare IM given by:    Discharge Disposition:  HOME/SELF CARE  Per UR Regulation:  Reviewed for med. necessity/level of care/duration of stay  If discussed at [place] of Stay Meetings, dates discussed:    Comments:  01/19/14 Filbert Craze RN,BSN NCM [phone] D/C [place].  01/16/14 MMcGibboney, RN, BSN Chart reviewed.

## 2014-01-19 NOTE — Procedures (Signed)
US guided therapeutic paracentesis performed yielding 5.2 liters yellow fluid. No immediate complications.

## 2014-01-19 NOTE — Progress Notes (Signed)
Patient has been refusing to use the urinal. Educated on the importance of strict intake and output. Will continue to monitor.

## 2014-01-20 DIAGNOSIS — E876 Hypokalemia: Secondary | ICD-10-CM

## 2014-01-20 LAB — CBC
HEMATOCRIT: 25.8 % — AB (ref 39.0–52.0)
HEMOGLOBIN: 8.3 g/dL — AB (ref 13.0–17.0)
MCH: 29.6 pg (ref 26.0–34.0)
MCHC: 32.2 g/dL (ref 30.0–36.0)
MCV: 92.1 fL (ref 78.0–100.0)
Platelets: 31 10*3/uL — ABNORMAL LOW (ref 150–400)
RBC: 2.8 MIL/uL — AB (ref 4.22–5.81)
RDW: 17.7 % — ABNORMAL HIGH (ref 11.5–15.5)
WBC: 5.8 10*3/uL (ref 4.0–10.5)

## 2014-01-20 LAB — TSH: TSH: 4.89 u[IU]/mL — AB (ref 0.350–4.500)

## 2014-01-20 LAB — BASIC METABOLIC PANEL
ANION GAP: 7 (ref 5–15)
BUN: 15 mg/dL (ref 6–23)
CALCIUM: 7.4 mg/dL — AB (ref 8.4–10.5)
CHLORIDE: 89 meq/L — AB (ref 96–112)
CO2: 34 mEq/L — ABNORMAL HIGH (ref 19–32)
CREATININE: 0.98 mg/dL (ref 0.50–1.35)
GFR calc Af Amer: >90 mL/min (ref >90)
GFR calc non Af Amer: 89 mL/min — ABNORMAL LOW (ref >90)
GLUCOSE: 92 mg/dL (ref 70–99)
Potassium: 3.5 mEq/L — ABNORMAL LOW (ref 3.7–5.3)
Sodium: 130 mEq/L — ABNORMAL LOW (ref 137–147)

## 2014-01-20 LAB — IRON AND TIBC
IRON: 20 ug/dL — AB (ref 42–135)
Saturation Ratios: 10 % — ABNORMAL LOW (ref 20–55)
TIBC: 203 ug/dL — AB (ref 215–435)
UIBC: 183 ug/dL (ref 125–400)

## 2014-01-20 LAB — FERRITIN: Ferritin: 29 ng/mL (ref 22–322)

## 2014-01-20 LAB — PROTIME-INR
INR: 2.37 — ABNORMAL HIGH (ref 0.00–1.49)
Prothrombin Time: 25.9 seconds — ABNORMAL HIGH (ref 11.6–15.2)

## 2014-01-20 LAB — VITAMIN B12

## 2014-01-20 MED ORDER — POTASSIUM CHLORIDE CRYS ER 20 MEQ PO TBCR
40.0000 meq | EXTENDED_RELEASE_TABLET | Freq: Once | ORAL | Status: AC
  Administered 2014-01-20: 40 meq via ORAL
  Filled 2014-01-20: qty 2

## 2014-01-20 NOTE — Progress Notes (Signed)
[facility] PROGRESS NOTE  Bradley Woods ENI:[id number] DOB: 11-14-1955 DOA: 01/15/2014 PCP: Bradley November, MD   Brief narrative 58 year old male with hep C cirrhosis on liver transplant list at [facility] (follows with Dr. Nolen Woods who is his gastroenterologist and Dr. Damita Woods his hepatologist) presented to the ED we progressive abdominal distention and weight gain of over 20 pounds for possibly weeks with associated abdominal discomfort poor by mouth intake and generalized malaise. Patient found to have significant ascites and anemia.      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 No signs of SBP. - has minimal symptoms of hepatic encephalopathy. Ammonia level elevated. We'll place him on oral lactulose to titrate with at least 2-3 bowel movements daily. I have not been able to reach his hepatologist despite multiple calls since yesterday.Marland Kitchen His meds score is 21. Continue Lasix and Aldactone. Dose adjusted to 40 mg Lasix daily and 100 mg Aldactone daily.  Hypokalemia -Replenish low potassium  Anemia iron panel suggest iron deficiency. No recent iron panel available. stool for occult blood pending. b12 normal and mildly elevated TSH. Received one unit PRBC on 7/27 with improvement in H&H. Monitor for GI bleed. Avoid NSAIDs   Thrombocytopenia Possibly due to cirrhosis. Monitor for bleeding.  coagulopathy Elevated INR. No signs of bleeding. Monitor closely.  DVT prophylaxis  supratherapeutic INR  Diet: low Sodium  Code Status: full code Family Communication: none at bedside Disposition Plan: Ordered PT. Awaiting call from his hepatologist. Possibly discharge home on 7/29.   Consultants:  IR  Procedures:  LVP on 7/24 (6L)  and 7/27 ( 5.2L)   Antibiotics:  NONE  HPI/Subjective: Patient seen and examined.   appears weak but denies any symptoms  Objective: Filed Vitals:   01/20/14 0456  BP: 104/62  Pulse: 98  Temp: 98.2 F (36.8 C)  Resp: 18    Intake/Output Summary (Last 24 hours) at 01/20/14 1440 Last data filed at 01/20/14 1007  Gross per 24 hour  Intake    458 ml  Output      0 ml  Net    458 ml   Filed Weights   01/18/14 0658 01/19/14 0627 01/20/14 0456  Weight: 84.369 kg (186 lb) 83.779 kg (184 lb 11.2 oz) 78.427 kg (172 lb 14.4 oz)    Exam:   General:  Middle aged male lying in bed appears weak and frail  HEENT: Pallor present, icteric, moist mucosa  Chest: Clear to auscultation bilaterally, no added sound  CVS: Normal S1 and S2, no murmurs  Abdomen: Soft, minimal distention following large volume paracenteses, nontender, bowel sounds present  Extremities:, Warm, trace edema  CNS: Alert and oriented, mild tremors  Data Reviewed: Basic Metabolic Panel:  Recent Labs Lab 01/16/14 0420 01/17/14 0425 01/18/14 0430 01/19/14 0502 01/20/14 0516  NA 126* 131* 131* 131* 130*  K 3.2* 3.6* 3.3* 3.1* 3.5*  CL 85* 90* 90* 89* 89*  CO2 31 33* 34* 35* 34*  GLUCOSE 133* 91 97 91 92  BUN 18 17 16 14 15   CREATININE 0.98 0.98 0.99 0.90 0.98  CALCIUM 7.3* 7.4* 7.3* 7.2* 7.4*   Liver Function Tests:  Recent Labs Lab 01/15/14 1718 01/16/14 0420 01/17/14 0425 01/18/14 0430 01/19/14 0502  AST 123* 90* 99* 97* 95*  ALT 41 31 30 30 29   ALKPHOS 93 78 90 90 76  BILITOT 5.7* 3.9* 3.5* 2.9* 3.2*  PROT 6.2 5.0* 5.2* 4.9* 4.9*  ALBUMIN 2.2* 1.7* 1.8* 1.8* 1.8*   No results found for this basename: LIPASE, AMYLASE,  in the last 168 hours  Recent Labs Lab 01/15/14 1718 01/19/14 2200  AMMONIA 37 105*   CBC:  Recent Labs Lab 01/15/14 1718 01/16/14 0420 01/17/14 0425 01/18/14 0430 01/19/14 0502 01/20/14 0516  WBC 8.7 7.8 6.7 6.3 5.1 5.8  NEUTROABS 6.8  --   --   --   --   --   HGB 9.5* 7.5* 7.7* 7.4* 7.1* 8.3*  HCT 28.0* 22.6* 23.4* 22.6* 21.9* 25.8*   MCV 94.3 95.8 96.7 95.0 95.2 92.1  PLT 104* 50* 44* 39* 31* 31*   Cardiac Enzymes: No results found for this basename: CKTOTAL, CKMB, CKMBINDEX, TROPONINI,  in the last 168 hours BNP (last 3 results) No results found for this basename: PROBNP,  in the last 8760 hours CBG: No results found for this basename: GLUCAP,  in the last 168 hours  No results found for this or any previous visit (from the past 240 hour(s)).   Studies: US Paracentesis  01/19/2014   CLINICAL DATA:  Cirrhosis, hepatitis-C, recurrent ascites. Request is made for therapeutic paracentesis.  EXAM: ULTRASOUND GUIDED THERAPEUTIC PARACENTESIS  COMPARISON:  PRIOR PARACENTESIS ON 01/16/2014  PROCEDURE: An ultrasound guided paracentesis was thoroughly discussed with the patient and questions answered. The benefits, risks, alternatives and complications were also discussed. The patient understands and wishes to proceed with the procedure. Written consent was obtained.  Ultrasound was performed to localize and mark an adequate pocket of fluid in the right mid to lower quadrant of the abdomen. The area was then prepped and draped in the normal sterile fashion. 1% Lidocaine was used for local anesthesia. Under ultrasound guidance a 19 gauge Yueh catheter was introduced. Paracentesis was performed. The catheter was removed and a dressing applied.  Complications: None.  FINDINGS: A total of approximately 5.2 liters of yellow fluid was removed.  IMPRESSION: Successful ultrasound guided therapeutic paracentesis yielding 5.2 liters of ascites.  Read by: Bradley Robert, PA-C   Electronically Signed   By: Bradley Woods M.D.   On: 01/19/2014 11:40    Scheduled Meds: . balsalazide  2,250 mg Oral TID  . citalopram  20 mg Oral Daily  . furosemide  40 mg Oral Daily  . sodium chloride  3 mL Intravenous Q12H  . spironolactone  100 mg Oral Daily   Continuous Infusions:     Time spent: 25 minutes    Bradley Woods, Bradley Woods  [facility] Pager [phone]. If 7PM-7AM, please contact night-coverage at [email or web address], password [facility] 01/20/2014, 2:40 PM  LOS: 5 days

## 2014-01-21 DIAGNOSIS — K7469 Other cirrhosis of liver: Secondary | ICD-10-CM

## 2014-01-21 DIAGNOSIS — D689 Coagulation defect, unspecified: Secondary | ICD-10-CM

## 2014-01-21 DIAGNOSIS — K7290 Hepatic failure, unspecified without coma: Secondary | ICD-10-CM

## 2014-01-21 DIAGNOSIS — R188 Other ascites: Secondary | ICD-10-CM

## 2014-01-21 DIAGNOSIS — B1920 Unspecified viral hepatitis C without hepatic coma: Secondary | ICD-10-CM

## 2014-01-21 DIAGNOSIS — K7682 Hepatic encephalopathy: Secondary | ICD-10-CM

## 2014-01-21 DIAGNOSIS — E43 Unspecified severe protein-calorie malnutrition: Secondary | ICD-10-CM | POA: Diagnosis present

## 2014-01-21 DIAGNOSIS — D509 Iron deficiency anemia, unspecified: Secondary | ICD-10-CM

## 2014-01-21 HISTORY — DX: Hepatic encephalopathy: K76.82

## 2014-01-21 HISTORY — DX: Hepatic failure, unspecified without coma: K72.90

## 2014-01-21 HISTORY — DX: Unspecified severe protein-calorie malnutrition: E43

## 2014-01-21 HISTORY — DX: Coagulation defect, unspecified: D68.9

## 2014-01-21 HISTORY — DX: Other ascites: R18.8

## 2014-01-21 LAB — BASIC METABOLIC PANEL
ANION GAP: 9 (ref 5–15)
BUN: 16 mg/dL (ref 6–23)
CO2: 32 mEq/L (ref 19–32)
Calcium: 7.5 mg/dL — ABNORMAL LOW (ref 8.4–10.5)
Chloride: 89 mEq/L — ABNORMAL LOW (ref 96–112)
Creatinine, Ser: 0.98 mg/dL (ref 0.50–1.35)
GFR calc Af Amer: >90 mL/min (ref >90)
GFR, EST NON AFRICAN AMERICAN: 89 mL/min — AB (ref >90)
Glucose, Bld: 77 mg/dL (ref 70–99)
POTASSIUM: 4 meq/L (ref 3.7–5.3)
Sodium: 130 mEq/L — ABNORMAL LOW (ref 137–147)

## 2014-01-21 LAB — CBC
HEMATOCRIT: 26.8 % — AB (ref 39.0–52.0)
Hemoglobin: 8.7 g/dL — ABNORMAL LOW (ref 13.0–17.0)
MCH: 29.7 pg (ref 26.0–34.0)
MCHC: 32.5 g/dL (ref 30.0–36.0)
MCV: 91.5 fL (ref 78.0–100.0)
Platelets: 27 10*3/uL — CL (ref 150–400)
RBC: 2.93 MIL/uL — AB (ref 4.22–5.81)
RDW: 17.8 % — AB (ref 11.5–15.5)
WBC: 5.5 10*3/uL (ref 4.0–10.5)

## 2014-01-21 MED ORDER — OXYCODONE HCL 5 MG PO TABS: 5.0000 mg | ORAL_TABLET | Freq: Three times a day (TID) | ORAL | Status: DC

## 2014-01-21 MED ORDER — SPIRONOLACTONE 100 MG PO TABS: 100.0000 mg | ORAL_TABLET | Freq: Every day | ORAL | Status: DC

## 2014-01-21 MED ORDER — FUROSEMIDE 40 MG PO TABS: 40.0000 mg | ORAL_TABLET | Freq: Every day | ORAL | Status: DC

## 2014-01-21 MED ORDER — LACTULOSE 10 GM/15ML PO SOLN: 10.0000 g | Freq: Two times a day (BID) | ORAL | Status: DC | PRN

## 2014-01-21 NOTE — Evaluation (Signed)
Physical Therapy Evaluation Patient Details Name: Bradley Woods MRN: [id number] DOB: 20-Jun-1956 Today's Date: 01/21/2014   History of Present Illness  58 yo male admitted with bil LE swelling, massive ascites. Hx of Hep C, liver failure  Clinical Impression  On eval, pt required Min assist for mobility-able to ambulate ~350 feet however pt had 3 instances of LOB requiring external assist to prevent fall. Discussed d/c plan-pt states wife will be home with him;son works. Instructed pt to use walker and to have someone assist him PRN until stability improves-pt stated he was agreeable to this. Recommend HHPT for balance training.     Follow Up Recommendations Home health PT;Supervision/Assistance - 24 hour (initially. Balance training.)    Equipment Recommendations  None recommended by PT (pt states he has walker at home.)    Recommendations for Other Services       Precautions / Restrictions Precautions Precautions: Fall Restrictions Weight Bearing Restrictions: No      Mobility  Bed Mobility Overal bed mobility: Modified Independent                Transfers Overall transfer level: Needs assistance   Transfers: Sit to/from Stand Sit to Stand: Min guard         General transfer comment: close guard for safety.   Ambulation/Gait Ambulation/Gait assistance: Min assist Ambulation Distance (Feet): 350 Feet Assistive device: None Gait Pattern/deviations: Step-through pattern;Decreased stride length     General Gait Details: LOB x 2 during ambulation requiring Min assist to prevent fall. Tolerated activity well.   Stairs            Wheelchair Mobility    Modified Rankin (Stroke Patients Only)       Balance Overall balance assessment: Needs assistance         Standing balance support: During functional activity Standing balance-Leahy Scale: Fair Standing balance comment: had pt perform eo/ec static standing, narrow BOS, withstanding of  external perturbations-all supervision level assist.  360 degree turns/picking up object/ alternating step-to task-Min assist to prevent LOB x1.                              Pertinent Vitals/Pain Pt denied pain    Home Living Family/patient expects to be discharged to:: Private residence Living Arrangements: Children Available Help at Discharge: Family Type of Home: House Home Access: Stairs to enter   Technical brewer of Steps: 1 Home Layout: One level Home Equipment: Environmental consultant - 2 wheels;Wheelchair - manual      Prior Function Level of Independence: Independent               Hand Dominance        Extremity/Trunk Assessment   Upper Extremity Assessment: Overall WFL for tasks assessed           Lower Extremity Assessment: Generalized weakness      Cervical / Trunk Assessment: Normal  Communication   Communication: No difficulties  Cognition Arousal/Alertness: Awake/alert Behavior During Therapy: WFL for tasks assessed/performed Overall Cognitive Status: Within Functional Limits for tasks assessed                      General Comments      Exercises        Assessment/Plan    PT Assessment Patient needs continued PT services  PT Diagnosis Difficulty walking   PT Problem List Decreased strength;Decreased activity tolerance;Decreased balance;Decreased mobility;Decreased knowledge of use of  DME  PT Treatment Interventions DME instruction;Gait training;Functional mobility training;Therapeutic activities;Balance training;Patient/family education   PT Goals (Current goals can be found in the Care Plan section) Acute Rehab PT Goals Patient Stated Goal: none stated PT Goal Formulation: With patient Time For Goal Achievement: 02/04/14 Potential to Achieve Goals: Good    Frequency Min 3X/week   Barriers to discharge        Co-evaluation               End of Session Equipment Utilized During Treatment: Gait  belt Activity Tolerance: Patient tolerated treatment well Patient left: in bed;with call bell/phone within reach           Time: 1431-1451 PT Time Calculation (min): 20 min   Charges:   PT Evaluation $Initial PT Evaluation Tier I: 1 Procedure PT Treatments $Gait Training: 8-22 mins   PT G Codes:          Weston Anna, MPT Pager: [phone]

## 2014-01-21 NOTE — Progress Notes (Signed)
CARE MANAGEMENT NOTE 01/21/2014  Patient:  Bradley Woods, Bradley Woods   Account Number:  [id number]  Date Initiated:  01/16/2014  Documentation initiated by:  Gabriel Earing  Subjective/Objective Assessment:   pt admitted with SOB,     Action/Plan:   from home   Anticipated DC Date:  01/19/2014   Anticipated DC Plan:  HOME/SELF CARE         Choice offered to / List presented to:             Status of service:  Completed, signed off Medicare Important Message given?  YES (If response is "NO", the following Medicare IM given date fields will be blank) Date Medicare IM given:  01/19/2014 Medicare IM given by:  [facility] Date Additional Medicare IM given:   Additional Medicare IM given by:    Discharge Disposition:  HOME/SELF CARE  Per UR Regulation:  Reviewed for med. necessity/level of care/duration of stay  If discussed at [place] of Stay Meetings, dates discussed:    Comments:  01/21/14 MMcGibboney, RN, BSN Pt declined [place] at this present time.  01/19/14 KATHY MAHABIR RN,BSN NCM [phone] D/C HOME NO NEEDS OR ORDERS.  01/16/14 MMcGibboney, RN, BSN Chart reviewed.

## 2014-01-21 NOTE — Discharge Instructions (Signed)
Cirrhosis  Cirrhosis is a condition of scarring of the liver which is caused when the liver has tried repairing itself following damage. This damage may come from a previous infection such as one of the forms of hepatitis (usually hepatitis C), or the damage may come from being injured by toxins. The main toxin that causes this damage is alcohol. The scarring of the liver from use of alcohol is irreversible. That means the liver cannot return to normal even though alcohol is not used any more. The main danger of hepatitis C infection is that it may cause long-lasting (chronic) liver disease, and this also may lead to cirrhosis. This complication is progressive and irreversible.  CAUSES   Prior to available blood tests, hepatitis C could be contracted by blood transfusions. Since testing of blood has improved, this is now unlikely. This infection can also be contracted through intravenous drug use and the sharing of needles. It can also be contracted through sexual relationships. The injury caused by alcohol comes from too much use. It is not a few drinks that poison the liver, but years of misuse. Usually there will be some signs and symptoms early with scarring of the liver that suggest the development of better habits. Alcohol should never be used while using acetaminophen. A small dose of both taken together may cause irreversible damage to the liver.  HOME CARE INSTRUCTIONS   There is no specific treatment for cirrhosis. However, there are things you can do to avoid making the condition worse.  · Rest as needed.  · Eat a well-balanced diet. Your caregiver can help you with suggestions.  · Vitamin supplements including vitamins A, K, D, and thiamine can help.  · A low-salt diet, water restriction, or diuretic medicine may be needed to reduce fluid retention.  · Avoid alcohol. This can be extremely toxic if combined with acetaminophen.  · Avoid drugs which are toxic to the liver. Some of these include isoniazid,  methyldopa, acetaminophen, anabolic steroids (muscle-building drugs), erythromycin, and oral contraceptives (birth control pills). Check with your caregiver to make sure medicines you are presently taking will not be harmful.  · Periodic blood tests may be required. Follow your caregiver's advice regarding the timing of these.  · Milk thistle is an herbal remedy which does protect the liver against toxins. However, it will not help once the liver has been scarred.  SEEK MEDICAL CARE IF:  · You have increasing fatigue or weakness.  · You develop swelling of the hands, feet, legs, or face.  · You vomit bright red blood, or a coffee ground appearing material.  · You have blood in your stools, or the stools turn black and tarry.  · You have a fever.  · You develop loss of appetite, or have nausea and vomiting.  · You develop jaundice.  · You develop easy bruising or bleeding.  · You have worsening of any of the problems you are concerned about.  Document Released: 06/12/2005 Document Revised: 09/04/2011 Document Reviewed: 01/29/2008  ExitCare® Patient Information ©2015 [employer]. This information is not intended to replace advice given to you by your health care provider. Make sure you discuss any questions you have with your health care provider.

## 2014-01-21 NOTE — Discharge Summary (Signed)
Physician Discharge Summary  Marquese Burkland NAT:[id number] DOB: 07-06-1955 DOA: 01/15/2014  PCP: [name], MD  Admit date: 01/15/2014 Discharge date: 01/21/2014  Time spent: 40  minutes  Recommendations for Outpatient Follow-up:  1. Discharge home with outpatient followup at [facility] hepatology in 1 week. Patient's hepatologist will arrange for follow up visit in 1-2 week 2. Followup with PCP in one week. He needs blood work for CBC and comprehensive metabolic panel checked  Discharge Diagnoses:  Principal Problem:   Hepatic cirrhosis due to chronic hepatitis C infection  Active Problems:   Liver failure   Anemia   Thrombocytopenia, unspecified   Hypokalemia   Ascites   Coagulopathy   Encephalopathy, hepatic   Discharge Condition: fair  Diet recommendation: regular  Filed Weights   01/19/14 0627 01/20/14 0456 01/21/14 0500  Weight: 83.779 kg (184 lb 11.2 oz) 78.427 kg (172 lb 14.4 oz) 80.2 kg (176 lb 12.9 oz)    History of present illness:  58 year old male with hep C cirrhosis on liver transplant list at [facility] (follows with Dr. Nolen Mu who is his gastroenterologist and Dr. Damita Lack his hepatologist) presented to the ED we progressive abdominal distention and weight gain of over 20 pounds for possibly weeks with associated abdominal discomfort poor by mouth intake and generalized malaise.  Patient found to have significant ascites and anemia.   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 No signs of SBP.  - has minimal symptoms of hepatic encephalopathy. Ammonia level elevated. We'll place him on oral lactulose to titrate with at least 2-3 bowel movements daily. His meds score is 21.  Started on Lasix and Aldactone and will be discharged on the same. Patient has anemia, thrombocytopenia and is jaundiced  and coagulopathic with decompensated cirrhosis. I have spoken with his hepatologist Dr. Charlean Sanfilippo at [facility] gastroenterology on 7/29 /2015 who reviewed his labs from 2014 (almost 18 months back) when he had normal hemoglobin of 16, platelets of 55, normal renal function, bilirubin 1.7 and albumin of 3.4. Recommend that this appears to be significant deterioration of his clinical condition. He plans to advance for outpatient followup within 1-2 weeks at his clinic with one of his partners as he will be away on vacation from tomorrow. -Patient is clinically stable at this time. And symptomatically improved after a cool large volume paracenteses. He was transfused with 1 unit PRBC on 7/27 with improvement in his hemoglobin and did not show any signs of GI bleed. -He will followup at the hepatology clinic in 1-2 weeks.  Hypokalemia  -Replenished Anemia  iron panel suggest iron deficiency. No recent iron panel available. stool for occult blood pending. b12 normal and high normal  TSH. Received one unit PRBC on 7/27 with improvement in H&H. No signs for GI bleed . Followup H&H as outpatient. Avoid NSAIDs   Thrombocytopenia  Secondary to cirrhosis. No signs of bleeding  coagulopathy  Elevated INR of 2.3. No signs of bleeding. Secondary to decompensated cirrhosis. Followup as outpatient  Protein calorie malnutrition We'll add supplements following nutrition consult  hyponatremia Secondary to cirrhosis  History of ? Ulcerative colitis Continue balsalazide   Code Status: full code  Family Communication: Discussed with wife over the phone Disposition Plan: Discharge home with outpatient followup  Consultants:  IR Discussed with patient's hepatologist on the form on 7/29  Procedures:  LVP on 7/24 (6L) and 7/27 ( 5.2L)   Antibiotics:  NONE   Discharge Exam: Filed Vitals:   01/21/14 0534  BP:   Pulse: 89  Temp: 98.5 F (36.9 C)  Resp: 20    General: Middle aged male  lying in bed appears weak and frail  HEENT: Pallor present, icteric, moist mucosa  Chest: Clear to auscultation bilaterally, no added sound  CVS: Normal S1 and S2, no murmurs  Abdomen: Soft, minimal distention ,  nontender, bowel sounds present  Extremities:, Warm, trace edema  CNS: Alert and oriented, no tremors   Discharge Instructions You were cared for by a hospitalist during your hospital stay. If you have any questions about your discharge medications or the care you received while you were in the hospital after you are discharged, you can call the unit and asked to speak with the hospitalist on call if the hospitalist that took care of you is not available. Once you are discharged, your primary care physician will handle any further medical issues. Please note that NO REFILLS for any discharge medications will be authorized once you are discharged, as it is imperative that you return to your primary care physician (or establish a relationship with a primary care physician if you do not have one) for your aftercare needs so that they can reassess your need for medications and monitor your lab values.     Medication List    STOP taking these medications       ibuprofen 200 MG tablet  Commonly known as:  ADVIL,MOTRIN     metoprolol tartrate 25 MG tablet  Commonly known as:  LOPRESSOR      TAKE these medications       balsalazide 750 MG capsule  Commonly known as:  COLAZAL  Take 2,250 mg by mouth 3 (three) times daily.     citalopram 20 MG tablet  Commonly known as:  CELEXA  Take 20 mg by mouth daily.     furosemide 40 MG tablet  Commonly known as:  LASIX  Take 1 tablet (40 mg total) by mouth daily.     lactulose 10 GM/15ML solution  Commonly known as:  CHRONULAC  Take 15 mLs (10 g total) by mouth 2 (two) times daily as needed for mild constipation (titrate to at least 2-3 BMs daily).     oxyCODONE 5 MG immediate release tablet  Commonly known as:  Oxy IR/ROXICODONE   Take 1 tablet (5 mg total) by mouth every 8 (eight) hours.     spironolactone 100 MG tablet  Commonly known as:  ALDACTONE  Take 1 tablet (100 mg total) by mouth daily.       No Known Allergies     Follow-up Information   Follow up with [name], MD. Schedule an appointment as soon as possible for a visit in 1 week.   Specialty:  Internal Medicine   Contact information:   [phone] [facility] [address] [place] [address] [phone]       Follow up with Charlean Sanfilippo, MD. Call in 1 week. (office will call with scheduled appt wirthin 1- 2 week)    Specialty:  Internal Medicine   Contact information:   [place] [address] [phone]        The results of significant diagnostics from this hospitalization (including imaging, microbiology, ancillary and laboratory) are listed below for reference.    Significant Diagnostic Studies: US Paracentesis  01/19/2014  CLINICAL DATA:  Cirrhosis, hepatitis-C, recurrent ascites. Request is made for therapeutic paracentesis.  EXAM: ULTRASOUND GUIDED THERAPEUTIC PARACENTESIS  COMPARISON:  PRIOR PARACENTESIS ON 01/16/2014  PROCEDURE: An ultrasound guided paracentesis was thoroughly discussed with the patient and questions answered. The benefits, risks, alternatives and complications were also discussed. The patient understands and wishes to proceed with the procedure. Written consent was obtained.  Ultrasound was performed to localize and mark an adequate pocket of fluid in the right mid to lower quadrant of the abdomen. The area was then prepped and draped in the normal sterile fashion. 1% Lidocaine was used for local anesthesia. Under ultrasound guidance a 19 gauge Yueh catheter was introduced. Paracentesis was performed. The catheter was removed and a dressing applied.  Complications: None.  FINDINGS: A total of approximately 5.2 liters of yellow fluid was removed.  IMPRESSION: Successful ultrasound guided  therapeutic paracentesis yielding 5.2 liters of ascites.  Read by: Rowe Robert, PA-C   Electronically Signed   By: Arne Cleveland M.D.   On: 01/19/2014 11:40    Microbiology: No results found for this or any previous visit (from the past 240 hour(s)).   Labs: Basic Metabolic Panel:  Recent Labs Lab 01/17/14 0425 01/18/14 0430 01/19/14 0502 01/20/14 0516 01/21/14 0435  NA 131* 131* 131* 130* 130*  K 3.6* 3.3* 3.1* 3.5* 4.0  CL 90* 90* 89* 89* 89*  CO2 33* 34* 35* 34* 32  GLUCOSE 91 97 91 92 77  BUN 17 16 14 15 16   CREATININE 0.98 0.99 0.90 0.98 0.98  CALCIUM 7.4* 7.3* 7.2* 7.4* 7.5*   Liver Function Tests:  Recent Labs Lab 01/15/14 1718 01/16/14 0420 01/17/14 0425 01/18/14 0430 01/19/14 0502  AST 123* 90* 99* 97* 95*  ALT 41 31 30 30 29   ALKPHOS 93 78 90 90 76  BILITOT 5.7* 3.9* 3.5* 2.9* 3.2*  PROT 6.2 5.0* 5.2* 4.9* 4.9*  ALBUMIN 2.2* 1.7* 1.8* 1.8* 1.8*   No results found for this basename: LIPASE, AMYLASE,  in the last 168 hours  Recent Labs Lab 01/15/14 1718 01/19/14 2200  AMMONIA 37 105*   CBC:  Recent Labs Lab 01/15/14 1718  01/17/14 0425 01/18/14 0430 01/19/14 0502 01/20/14 0516 01/21/14 0435  WBC 8.7  < > 6.7 6.3 5.1 5.8 5.5  NEUTROABS 6.8  --   --   --   --   --   --   HGB 9.5*  < > 7.7* 7.4* 7.1* 8.3* 8.7*  HCT 28.0*  < > 23.4* 22.6* 21.9* 25.8* 26.8*  MCV 94.3  < > 96.7 95.0 95.2 92.1 91.5  PLT 104*  < > 44* 39* 31* 31* 27*  < > = values in this interval not displayed. Cardiac Enzymes: No results found for this basename: CKTOTAL, CKMB, CKMBINDEX, TROPONINI,  in the last 168 hours BNP: BNP (last 3 results) No results found for this basename: PROBNP,  in the last 8760 hours CBG: No results found for this basename: GLUCAP,  in the last 168 hours     Signed:  Jahmari Esbenshade  [facility] 01/21/2014, 1:50 PM

## 2014-01-23 LAB — TYPE AND SCREEN
ABO/RH(D): A POS
ANTIBODY SCREEN: NEGATIVE
Unit division: 0
Unit division: 0

## 2014-03-14 ENCOUNTER — Other Ambulatory Visit: Payer: Self-pay | Admitting: Internal Medicine

## 2014-04-24 ENCOUNTER — Telehealth: Payer: Self-pay | Admitting: *Deleted

## 2014-04-24 ENCOUNTER — Ambulatory Visit: Payer: Medicare Other | Admitting: Internal Medicine

## 2014-04-24 NOTE — Telephone Encounter (Signed)
Is ok.

## 2014-04-24 NOTE — Telephone Encounter (Signed)
Pt did not show for appointment 04/24/2014 at 2:15pm to discuss being put back on liver transplant list

## 2014-04-27 NOTE — Telephone Encounter (Signed)
See note below

## 2014-04-28 ENCOUNTER — Telehealth: Payer: Self-pay | Admitting: Internal Medicine

## 2014-04-28 NOTE — Telephone Encounter (Signed)
Per Dr. Larose Kells he will sign orders if they fax them to [place] from [place], Pt needs appt with Dr. Larose Kells at earliest convenience, okay to put 2 appts together if necessary.

## 2014-04-28 NOTE — Telephone Encounter (Signed)
Please advise 

## 2014-04-28 NOTE — Telephone Encounter (Signed)
Spoke to [employer] at [employer]. Informed her of what dr. Larose Kells states and appointments scheduled.

## 2014-04-28 NOTE — Telephone Encounter (Signed)
Left message for Bradley Woods to return my call

## 2014-04-28 NOTE — Telephone Encounter (Signed)
Caller name: Angie Fava, hospitalist --[facility] Relation to pt: Call back number: pager [phone] Pharmacy:  Reason for call:   Patient is being discharged from hospital today. We have not seen patient since 2012. When do you want patient to be seen? Also, Angie Fava says that she wants labs drawn on patient within the next few days. She states that she can fax over lab orders. Please advise.

## 2014-04-30 ENCOUNTER — Encounter (HOSPITAL_COMMUNITY): Payer: Self-pay | Admitting: Emergency Medicine

## 2014-04-30 ENCOUNTER — Other Ambulatory Visit: Payer: Medicare Other

## 2014-04-30 ENCOUNTER — Telehealth: Payer: Self-pay

## 2014-04-30 ENCOUNTER — Inpatient Hospital Stay (HOSPITAL_COMMUNITY)
Admission: EM | Admit: 2014-04-30 | Discharge: 2014-05-04 | DRG: 193 | Disposition: A | Discharge status: Home / Self Care | Payer: Medicare Other | Attending: Internal Medicine | Admitting: Internal Medicine

## 2014-04-30 ENCOUNTER — Emergency Department (HOSPITAL_COMMUNITY): Payer: Medicare Other

## 2014-04-30 DIAGNOSIS — R188 Other ascites: Secondary | ICD-10-CM | POA: Diagnosis present

## 2014-04-30 DIAGNOSIS — E871 Hypo-osmolality and hyponatremia: Secondary | ICD-10-CM | POA: Diagnosis present

## 2014-04-30 DIAGNOSIS — R0602 Shortness of breath: Secondary | ICD-10-CM

## 2014-04-30 DIAGNOSIS — J189 Pneumonia, unspecified organism: Secondary | ICD-10-CM | POA: Diagnosis present

## 2014-04-30 DIAGNOSIS — Z6824 Body mass index (BMI) 24.0-24.9, adult: Secondary | ICD-10-CM | POA: Diagnosis not present

## 2014-04-30 DIAGNOSIS — K7460 Unspecified cirrhosis of liver: Secondary | ICD-10-CM | POA: Diagnosis present

## 2014-04-30 DIAGNOSIS — E43 Unspecified severe protein-calorie malnutrition: Secondary | ICD-10-CM | POA: Diagnosis present

## 2014-04-30 DIAGNOSIS — I10 Essential (primary) hypertension: Secondary | ICD-10-CM | POA: Diagnosis present

## 2014-04-30 DIAGNOSIS — Y95 Nosocomial condition: Secondary | ICD-10-CM | POA: Diagnosis present

## 2014-04-30 DIAGNOSIS — D696 Thrombocytopenia, unspecified: Secondary | ICD-10-CM | POA: Diagnosis present

## 2014-04-30 DIAGNOSIS — C220 Liver cell carcinoma: Secondary | ICD-10-CM | POA: Diagnosis present

## 2014-04-30 DIAGNOSIS — K7290 Hepatic failure, unspecified without coma: Secondary | ICD-10-CM | POA: Diagnosis present

## 2014-04-30 DIAGNOSIS — Z79899 Other long term (current) drug therapy: Secondary | ICD-10-CM | POA: Diagnosis not present

## 2014-04-30 DIAGNOSIS — D689 Coagulation defect, unspecified: Secondary | ICD-10-CM | POA: Diagnosis present

## 2014-04-30 DIAGNOSIS — D638 Anemia in other chronic diseases classified elsewhere: Secondary | ICD-10-CM | POA: Diagnosis present

## 2014-04-30 DIAGNOSIS — B182 Chronic viral hepatitis C: Secondary | ICD-10-CM | POA: Diagnosis present

## 2014-04-30 DIAGNOSIS — F418 Other specified anxiety disorders: Secondary | ICD-10-CM | POA: Diagnosis present

## 2014-04-30 DIAGNOSIS — K7682 Hepatic encephalopathy: Secondary | ICD-10-CM | POA: Diagnosis present

## 2014-04-30 DIAGNOSIS — D649 Anemia, unspecified: Secondary | ICD-10-CM | POA: Diagnosis present

## 2014-04-30 DIAGNOSIS — E8809 Other disorders of plasma-protein metabolism, not elsewhere classified: Secondary | ICD-10-CM | POA: Diagnosis present

## 2014-04-30 HISTORY — DX: Other disorders of plasma-protein metabolism, not elsewhere classified: E88.09

## 2014-04-30 HISTORY — DX: Sleep apnea, unspecified: G47.30

## 2014-04-30 HISTORY — DX: Other specified anxiety disorders: F41.8

## 2014-04-30 HISTORY — DX: Hypersomnia, unspecified: G47.10

## 2014-04-30 HISTORY — DX: Personal history of other diseases of the digestive system: Z87.19

## 2014-04-30 HISTORY — DX: Unspecified severe protein-calorie malnutrition: E43

## 2014-04-30 HISTORY — DX: Hypo-osmolality and hyponatremia: E87.1

## 2014-04-30 HISTORY — DX: Hepatic failure, unspecified without coma: K72.90

## 2014-04-30 HISTORY — DX: Coagulation defect, unspecified: D68.9

## 2014-04-30 HISTORY — DX: Chronic viral hepatitis C: B18.2

## 2014-04-30 HISTORY — DX: Essential (primary) hypertension: I10

## 2014-04-30 HISTORY — DX: Unspecified cirrhosis of liver: K74.60

## 2014-04-30 HISTORY — DX: Anemia, unspecified: D64.9

## 2014-04-30 HISTORY — DX: Allergic rhinitis, unspecified: J30.9

## 2014-04-30 HISTORY — DX: Other ascites: R18.8

## 2014-04-30 HISTORY — DX: Other specified noninfective gastroenteritis and colitis: K52.89

## 2014-04-30 HISTORY — DX: Ulcerative colitis, unspecified, without complications: K51.90

## 2014-04-30 HISTORY — DX: Thrombocytopenia, unspecified: D69.6

## 2014-04-30 LAB — CBC WITH DIFFERENTIAL/PLATELET
BASOS PCT: 0 % (ref 0–1)
Basophils Absolute: 0 10*3/uL (ref 0.0–0.1)
EOS ABS: 0.2 10*3/uL (ref 0.0–0.7)
Eosinophils Relative: 2 % (ref 0–5)
HCT: 28.9 % — ABNORMAL LOW (ref 39.0–52.0)
Hemoglobin: 9.3 g/dL — ABNORMAL LOW (ref 13.0–17.0)
Lymphocytes Relative: 9 % — ABNORMAL LOW (ref 12–46)
Lymphs Abs: 0.9 10*3/uL (ref 0.7–4.0)
MCH: 27.8 pg (ref 26.0–34.0)
MCHC: 32.2 g/dL (ref 30.0–36.0)
MCV: 86.3 fL (ref 78.0–100.0)
Monocytes Absolute: 0.9 10*3/uL (ref 0.1–1.0)
Monocytes Relative: 9 % (ref 3–12)
NEUTROS PCT: 81 % — AB (ref 43–77)
Neutro Abs: 8.7 10*3/uL — ABNORMAL HIGH (ref 1.7–7.7)
Platelets: 68 10*3/uL — ABNORMAL LOW (ref 150–400)
RBC: 3.35 MIL/uL — ABNORMAL LOW (ref 4.22–5.81)
RDW: 20.9 % — ABNORMAL HIGH (ref 11.5–15.5)
WBC: 10.8 10*3/uL — ABNORMAL HIGH (ref 4.0–10.5)

## 2014-04-30 LAB — LIPASE, BLOOD: Lipase: 42 U/L (ref 11–59)

## 2014-04-30 LAB — COMPREHENSIVE METABOLIC PANEL
ALBUMIN: 1.9 g/dL — AB (ref 3.5–5.2)
ALK PHOS: 105 U/L (ref 39–117)
ALT: 47 U/L (ref 0–53)
AST: 64 U/L — ABNORMAL HIGH (ref 0–37)
Anion gap: 10 (ref 5–15)
BUN: 10 mg/dL (ref 6–23)
CO2: 24 mEq/L (ref 19–32)
Calcium: 7.9 mg/dL — ABNORMAL LOW (ref 8.4–10.5)
Chloride: 93 mEq/L — ABNORMAL LOW (ref 96–112)
Creatinine, Ser: 0.74 mg/dL (ref 0.50–1.35)
GFR calc Af Amer: >90 mL/min (ref >90)
GFR calc non Af Amer: >90 mL/min (ref >90)
Glucose, Bld: 97 mg/dL (ref 70–99)
POTASSIUM: 4.2 meq/L (ref 3.7–5.3)
SODIUM: 127 meq/L — AB (ref 137–147)
TOTAL PROTEIN: 5.5 g/dL — AB (ref 6.0–8.3)
Total Bilirubin: 4.8 mg/dL — ABNORMAL HIGH (ref 0.3–1.2)

## 2014-04-30 LAB — PROTIME-INR
INR: 2.33 — ABNORMAL HIGH (ref 0.00–1.49)
PROTHROMBIN TIME: 25.8 s — AB (ref 11.6–15.2)

## 2014-04-30 LAB — AMMONIA: Ammonia: 14 umol/L (ref 11–60)

## 2014-04-30 MED ORDER — SPIRONOLACTONE 100 MG PO TABS
100.0000 mg | ORAL_TABLET | Freq: Every day | ORAL | Status: DC
  Administered 2014-05-01 – 2014-05-03 (×3): 100 mg via ORAL
  Filled 2014-04-30 (×3): qty 1

## 2014-04-30 MED ORDER — OXYCODONE HCL 5 MG PO TABS
5.0000 mg | ORAL_TABLET | Freq: Three times a day (TID) | ORAL | Status: DC | PRN
Start: 2014-04-30 — End: 2014-05-01

## 2014-04-30 MED ORDER — PIPERACILLIN-TAZOBACTAM 3.375 G IVPB 30 MIN
3.3750 g | Freq: Once | INTRAVENOUS | Status: AC
  Administered 2014-04-30: 3.375 g via INTRAVENOUS
  Filled 2014-04-30: qty 50

## 2014-04-30 MED ORDER — CITALOPRAM HYDROBROMIDE 20 MG PO TABS
20.0000 mg | ORAL_TABLET | Freq: Every day | ORAL | Status: DC
Start: 2014-05-01 — End: 2014-05-02
  Filled 2014-04-30 (×2): qty 1

## 2014-04-30 MED ORDER — BALSALAZIDE DISODIUM 750 MG PO CAPS
2250.0000 mg | ORAL_CAPSULE | Freq: Three times a day (TID) | ORAL | Status: DC
  Administered 2014-05-01 – 2014-05-02 (×4): 2250 mg via ORAL
  Filled 2014-04-30 (×6): qty 3

## 2014-04-30 MED ORDER — PIPERACILLIN-TAZOBACTAM 3.375 G IVPB
3.3750 g | Freq: Three times a day (TID) | INTRAVENOUS | Status: DC
  Administered 2014-04-30 – 2014-05-03 (×8): 3.375 g via INTRAVENOUS
  Filled 2014-04-30 (×9): qty 50

## 2014-04-30 MED ORDER — FUROSEMIDE 40 MG PO TABS
40.0000 mg | ORAL_TABLET | Freq: Every day | ORAL | Status: DC
  Administered 2014-05-01 – 2014-05-03 (×3): 40 mg via ORAL
  Filled 2014-04-30 (×3): qty 1

## 2014-04-30 MED ORDER — VANCOMYCIN HCL 10 G IV SOLR
1500.0000 mg | Freq: Once | INTRAVENOUS | Status: AC
  Administered 2014-04-30: 1500 mg via INTRAVENOUS
  Filled 2014-04-30: qty 1500

## 2014-04-30 MED ORDER — LACTULOSE 10 GM/15ML PO SOLN
10.0000 g | Freq: Two times a day (BID) | ORAL | Status: DC | PRN
  Administered 2014-05-01: 10 g via ORAL
  Filled 2014-04-30 (×2): qty 15

## 2014-04-30 MED ORDER — VANCOMYCIN HCL IN DEXTROSE 1-5 GM/200ML-% IV SOLN
1000.0000 mg | Freq: Two times a day (BID) | INTRAVENOUS | Status: DC
  Administered 2014-05-01 – 2014-05-03 (×5): 1000 mg via INTRAVENOUS
  Filled 2014-04-30 (×6): qty 200

## 2014-04-30 NOTE — Telephone Encounter (Signed)
-----   Message from Irven Baltimore sent at 04/28/2014  4:17 PM EST ----- Patient is being discharged from [facility] today. Appointment scheduled for 05/05/14

## 2014-04-30 NOTE — Telephone Encounter (Signed)
Left a message for call back.  

## 2014-04-30 NOTE — ED Provider Notes (Signed)
CSN: [id number]     Arrival date & time 04/30/14  1751 History   First MD Initiated Contact with Patient 04/30/14 1000     Chief Complaint  Patient presents with  . Ascites     (Consider location/radiation/quality/duration/timing/severity/associated sxs/prior Treatment) Patient is a 58 y.o. male presenting with shortness of breath.  Shortness of Breath Severity:  Moderate Onset quality:  Gradual Duration:  3 days Timing:  Constant Progression:  Worsening Chronicity:  Recurrent Context comment:  Hospitalized at [facility] last week, discharged Monday. Hospitalization for ascites, liver failure.  Had therapeutic paracentesis done during that hospitalization. Relieved by:  Nothing Worsened by:  Nothing tried Associated symptoms: abdominal pain (mild, generalized)   Associated symptoms: no chest pain and no fever   Associated symptoms comment:  Fatigue, drowsiness   Past Medical History  Diagnosis Date  . Hepatitis C   . Anxiety     dx w/ depression 07/2008  . Colitis   . Alcohol abuse, in remission   . Headache(784.0)   . Allergic rhinitis   . Hepatic failure due to alcoholism    Past Surgical History  Procedure Laterality Date  . Tonsillectomy     Family History  Problem Relation Age of Onset  . Diabetes      GM  . Coronary artery disease    . Hyperlipidemia    . Prostate cancer    . Colon cancer Neg Hx    History  Substance Use Topics  . Smoking status: Never Smoker   . Smokeless tobacco: Not on file  . Alcohol Use: No    Review of Systems  Constitutional: Negative for fever.  Respiratory: Positive for shortness of breath.   Cardiovascular: Negative for chest pain.  Gastrointestinal: Positive for abdominal pain (mild, generalized).  All other systems reviewed and are negative.     Allergies  Review of patient's allergies indicates no known allergies.  Home Medications   Prior to Admission medications   Medication Sig Start Date End Date Taking?  Authorizing Provider  balsalazide (COLAZAL) 750 MG capsule Take 2,250 mg by mouth 3 (three) times daily.      Historical Provider, MD  citalopram (CELEXA) 20 MG tablet Take 20 mg by mouth daily.      Historical Provider, MD  furosemide (LASIX) 40 MG tablet Take 1 tablet (40 mg total) by mouth daily. 01/21/14   Nishant Dhungel, MD  lactulose (CHRONULAC) 10 GM/15ML solution Take 15 mLs (10 g total) by mouth 2 (two) times daily as needed for mild constipation (titrate to at least 2-3 BMs daily). 01/21/14   Nishant Dhungel, MD  oxyCODONE (OXY IR/ROXICODONE) 5 MG immediate release tablet Take 1 tablet (5 mg total) by mouth every 8 (eight) hours. 01/21/14   Nishant Dhungel, MD  spironolactone (ALDACTONE) 100 MG tablet Take 1 tablet (100 mg total) by mouth daily. 01/21/14   Nishant Dhungel, MD   BP 110/65 mmHg  Pulse 110  Temp(Src) 98.2 F (36.8 C) (Oral)  SpO2 89% Physical Exam  Constitutional: He is oriented to person, place, and time. He appears well-developed and well-nourished. No distress.  HENT:  Head: Normocephalic and atraumatic.  Mouth/Throat: Oropharynx is clear and moist.  Eyes: Conjunctivae are normal. Pupils are equal, round, and reactive to light. No scleral icterus.  Neck: Neck supple.  Cardiovascular: Normal rate, regular rhythm, normal heart sounds and intact distal pulses.   No murmur heard. Pulmonary/Chest: Breath sounds normal. No stridor. Tachypnea noted. No respiratory distress. He has no wheezes.  He has no rales.  Abdominal: Soft. He exhibits distension and ascites. There is tenderness (mild). There is no rigidity, no rebound and no guarding.  Musculoskeletal: Normal range of motion. He exhibits no edema.  Neurological: He is alert and oriented to person, place, and time.  Skin: Skin is warm and dry. No rash noted.  Mildly jaundiced  Psychiatric: He has a normal mood and affect. His behavior is normal.  Nursing note and vitals reviewed.   ED Course  Procedures (including  critical care time) Labs Review Labs Reviewed  CBC WITH DIFFERENTIAL - Abnormal; Notable for the following:    WBC 10.8 (*)    RBC 3.35 (*)    Hemoglobin 9.3 (*)    HCT 28.9 (*)    RDW 20.9 (*)    Platelets 68 (*)    Neutrophils Relative % 81 (*)    Neutro Abs 8.7 (*)    Lymphocytes Relative 9 (*)    All other components within normal limits  COMPREHENSIVE METABOLIC PANEL - Abnormal; Notable for the following:    Sodium 127 (*)    Chloride 93 (*)    Calcium 7.9 (*)    Total Protein 5.5 (*)    Albumin 1.9 (*)    AST 64 (*)    Total Bilirubin 4.8 (*)    All other components within normal limits  PROTIME-INR - Abnormal; Notable for the following:    Prothrombin Time 25.8 (*)    INR 2.33 (*)    All other components within normal limits  CULTURE, BLOOD (ROUTINE X 2)  CULTURE, BLOOD (ROUTINE X 2)  CULTURE, EXPECTORATED SPUTUM-ASSESSMENT  GRAM STAIN  LIPASE, BLOOD  AMMONIA  URINALYSIS, ROUTINE W REFLEX MICROSCOPIC  HIV ANTIBODY (ROUTINE TESTING)  LEGIONELLA ANTIGEN, URINE  STREP PNEUMONIAE URINARY ANTIGEN    Imaging Review Dg Chest 2 View  04/30/2014   CLINICAL DATA:  Liver failure. Now with shortness of breath and distended abdomen  EXAM: CHEST  2 VIEW  COMPARISON:  8/28/9  FINDINGS: Normal heart size. There is diffuse airspace opacification throughout the right lung. This is new from previous exam. Left lung is clear. No pleural effusions identified. 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  IMPRESSION: 1. Asymmetric opacification of the right lung, new from previous exam. This may represent multifocal pneumonia or asymmetric edema.   Electronically Signed   By: Kerby Moors M.D.   On: 04/30/2014 11:46  All radiology studies independently viewed by me.      EKG Interpretation None      MDM   Final diagnoses:  Shortness of breath    58 yo male with hx of liver failure secondary to hep c who presents with worsening abdominal distension and  shortness of breath.  Similar symptoms last week when he was hospitalized at [facility].  Now, he is hypoxic and requiring 2L [place] O2.    CXR concerning for possible pneumonia.  Treated for HCAP. Admitted by internal medicine.    Artis Delay, MD 04/30/14 [phone]

## 2014-04-30 NOTE — ED Notes (Signed)
Bed: GR03 Expected date:  Expected time:  Means of arrival:  Comments: ascites

## 2014-04-30 NOTE — Telephone Encounter (Signed)
Alba Spouse [phone]   Alba called and Iban has been taking to [employer], his stomach was extended.

## 2014-04-30 NOTE — Progress Notes (Signed)
ANTIBIOTIC CONSULT NOTE - INITIAL  Pharmacy Consult for Vancomycin & Zosyn Indication: rule out pneumonia, treat for HCAP  No Known Allergies  Patient Measurements: Weight: 168 lb (76.204 kg)  Vital Signs: Temp: 98.2 F (36.8 C) (11/05 1230) Temp Source: Oral (11/05 1230) BP: 104/64 mmHg (11/05 1230) Pulse Rate: 104 (11/05 1230) Intake/Output from previous day:   Intake/Output from this shift:    Labs:  Recent Labs  04/30/14 1112  WBC 10.8*  HGB 9.3*  PLT 68*  CREATININE 0.74   Estimated Creatinine Clearance: 100.6 mL/min (by C-G formula based on Cr of 0.74). No results for input(s): VANCOTROUGH, VANCOPEAK, VANCORANDOM, GENTTROUGH, GENTPEAK, GENTRANDOM, TOBRATROUGH, TOBRAPEAK, TOBRARND, AMIKACINPEAK, AMIKACINTROU, AMIKACIN in the last 72 hours.   Microbiology: No results found for this or any previous visit (from the past 720 hour(s)).  Medical History: Past Medical History  Diagnosis Date  . Hepatitis C   . Anxiety     dx w/ depression 07/2008  . Colitis   . Alcohol abuse, in remission   . Headache(784.0)   . Allergic rhinitis   . Hepatic failure due to alcoholism    Medications:  Scheduled:   Anti-infectives    Start     Dose/Rate Route Frequency Ordered Stop   04/30/14 1400  vancomycin (VANCOCIN) 1,500 mg in sodium chloride 0.9 % 500 mL IVPB     1,500 mg250 mL/hr over 120 Minutes Intravenous  Once 04/30/14 1259     04/30/14 1300  piperacillin-tazobactam (ZOSYN) IVPB 3.375 g     3.375 g100 mL/hr over 30 Minutes Intravenous  Once 04/30/14 1258       Assessment: 4 yoM with hx of Hep C cirrhosis, recent admit to [facility] for ascitis, paracentesis, discharged 11/2. To ED with ascites, SHOB. CXray with R lung opacity; PNA vs edema. Vancomcyin and Zosyn for possible pna. Note patient on liver transplant list. Begin antibiotics in ED.  Goal of Therapy:  Vancomycin trough level 15-20 mcg/ml  Plan:   Zosyn 3.375gm q8h, first dose in ED over 30  minutes  Vancomycin 1500mg  x1, then 1gm 12  Trough if necessary  Minda Ditto PharmD Pager [phone] 04/30/2014, 1:23 PM

## 2014-04-30 NOTE — ED Notes (Addendum)
Per EMS: Pt was seen on Monday at [facility], lasix was decreased due to low NA levels, now opt having increased abd fluids and c/o SOB when getting up and exertion. Denies pain.

## 2014-04-30 NOTE — Progress Notes (Signed)
Admission note:  Arrival Method: By wheelchair from ED Mental Orientation: Alert and oriented x 4  Assessment:  Skin: Small cuts on tops of both feet, no other skin issues noted at this time.  IV: 20g in left AC infusing Pain: denies any pain at this time Safety Measures: Call light within reach, pt oriented to unit, all tubes secured Fall Prevention Safety Plan: reviewed importance of calling nurse or nursing assistant if he needs to get up, verbalized understanding and acknowledged importance of not falling.  Admission Screening: Completed by admission nurse [id number] Orientation: Patient has been oriented to the unit, staff and to the room.  Shelbie Hutching, RN

## 2014-04-30 NOTE — H&P (Addendum)
History and Physical:    Bradley Woods TGY:[id number] DOB: [birth year] DOA: 04/30/2014  Referring physician: Dr. Doy Mince PCP: [name], MD   Chief Complaint: Abdominal distention/dyspnea  History of Present Illness:   Bradley Woods is an 58 y.o. male with a PMH cirrhosis of the liver secondary to hepatitis C, on a liver transplant list at [facility], who presents to the ER today with a chief complaint of worsening abdominal distention accompanied by dyspnea. Patient was recently discharged from [facility] 2 days ago where he was treated for hyponatremia and liver cancer, but I am unable to access his records from [place].  The patient tells me he had a paracentesis while at [facility], but now feels like the distention in his abdomen is worse.  The patient tells me that he "couldn't hardly get up this morning" and was "so dizzy, I couldn't hardly do anything".  Says he had dyspnea and cough as well, and that it feels like he is having "mini panic attacks" because of the dyspnea.  Cough is dry.  No fever or chills. Patient does mention that he had an elevated white blood cell count when he was at [facility], but his WBC is only mildly elevated at 10.8 here.  Chest x-ray does show significant abnormalities concerning for a multifocal pneumonia versus asymmetric edema.  ROS:   Constitutional: No fever, no chills;  Appetite diminished; No weight loss, + weight gain, + fatigue.  HEENT: No blurry vision, no diplopia, no pharyngitis, no dysphagia CV: No chest pain, no palpitations, no PND, no orthopnea, no edema.  Resp: + SOB, + cough, no pleuritic pain. GI: No nausea, no vomiting, no diarrhea, no melena, no hematochezia, no constipation, + abdominal pain.  GU: No dysuria, no hematuria, no frequency, no urgency. MSK: no myalgias, no arthralgias.  Neuro:  No headache, no focal neurological deficits, no history of seizures.  Psych: + depression, + anxiety.  Endo: No heat intolerance, no cold  intolerance, no polyuria, no polydipsia  Skin: No rashes, no skin lesions.  Heme: No easy bruising.  Travel history: No recent travel.   Past Medical History:   Past Medical History  Diagnosis Date  . Hepatitis C   . Anxiety     dx w/ depression 07/2008  . Colitis   . Alcohol abuse, in remission   . Headache(784.0)   . Allergic rhinitis   . Hepatic failure due to alcoholism   . Ulcerative colitis   . Coagulopathy 01/21/2014  . Severe protein-calorie malnutrition 01/21/2014  . Depression with anxiety 04/30/2014  . Ascites 01/21/2014  . ALLERGIC RHINITIS 04/28/2008    Qualifier: Diagnosis of  By: Larose Kells MD, [facility] 04/04/2007    Annotation: cscope 08-2005 (had diarrhea) Qualifier: Diagnosis of  By: Larose Kells MD, [place] HYPERSOMNIA, ASSOCIATED WITH SLEEP APNEA 09/20/2009    Qualifier: Diagnosis of  By: Elsworth Soho MD, [place], HX OF 06/15/2009    Qualifier: Diagnosis of  By: Inda Castle FNP, [place]   . HTN (hypertension) 09/26/2010  . Liver failure 01/15/2014  . Hepatic cirrhosis due to chronic hepatitis C infection 01/15/2014  . Anemia 01/19/2014  . Thrombocytopenia 01/19/2014  . Encephalopathy, hepatic 01/21/2014  . Hyponatremia 04/30/2014  . Hypoalbuminemia 04/30/2014    Past Surgical History:   Past Surgical History  Procedure Laterality Date  . Tonsillectomy      Social History:   History  Social History  . Marital Status: Divorced    Spouse Name: N/A    Number of Children: 1  . Years of Education: N/A   Occupational History  . Not on file.   Social History Main Topics  . Smoking status: Never Smoker   . Smokeless tobacco: Not on file  . Alcohol Use: No     Comment: Quit in 1988.  . Drug Use: No  . Sexual Activity: Not on file   Other Topics Concern  . Not on file   Social History Narrative   Single father   seperated (2008) but good relationship w/ wife   On disability   Daily caffeine use   Pt is on Liver Transplace @  [place]          Family history:   Family History  Problem Relation Age of Onset  . Diabetes      GM  . Coronary artery disease    . Hyperlipidemia    . Prostate cancer    . Colon cancer Neg Hx     Allergies   Review of patient's allergies indicates no known allergies.  Current Medications:   Prior to Admission medications   Medication Sig Start Date End Date Taking? Authorizing Provider  balsalazide (COLAZAL) 750 MG capsule Take 2,250 mg by mouth 3 (three) times daily.      Historical Provider, MD  citalopram (CELEXA) 20 MG tablet Take 20 mg by mouth daily.      Historical Provider, MD  furosemide (LASIX) 40 MG tablet Take 1 tablet (40 mg total) by mouth daily. 01/21/14   Nishant Dhungel, MD  lactulose (CHRONULAC) 10 GM/15ML solution Take 15 mLs (10 g total) by mouth 2 (two) times daily as needed for mild constipation (titrate to at least 2-3 BMs daily). 01/21/14   Nishant Dhungel, MD  oxyCODONE (OXY IR/ROXICODONE) 5 MG immediate release tablet Take 1 tablet (5 mg total) by mouth every 8 (eight) hours. 01/21/14   Nishant Dhungel, MD  spironolactone (ALDACTONE) 100 MG tablet Take 1 tablet (100 mg total) by mouth daily. 01/21/14   Nishant Dhungel, MD    Physical Exam:   Filed Vitals:   04/30/14 1300 04/30/14 1400 04/30/14 1500 04/30/14 1548  BP: 97/65 99/61 96/64  103/89  Pulse: 100 103 102 107  Temp:    98 F (36.7 C)  TempSrc:    Axillary  Resp: 20 21 24 20   Weight:      SpO2: 96% 97% 99% 98%     Physical Exam: Blood pressure 103/89, pulse 107, temperature 98 F (36.7 C), temperature source Axillary, resp. rate 20, weight 76.204 kg (168 lb), SpO2 98 %. Gen: No acute distress. Head: Normocephalic, atraumatic. Eyes: PERRL, EOMI, sclerae nonicteric. Mouth: Oropharynx clear with no posterior pharyngeal exudates. Neck: Supple, no thyromegaly, no lymphadenopathy, no jugular venous distention. Chest: Lungs sounds reveal crackles to the right base, diminished on the  left. CV: Heart sounds are regular. Mildly tachycardic. Abdomen: Soft but significantly distended with a fluid wave and prominent ascites. Extremities: Extremities show 2+ pitting edema. Skin: Warm and dry. Neuro: Alert and oriented times 3; cranial nerves II through XII grossly intact. Psych: Mood and affect mildly depressed.   Data Review:    Labs: Basic Metabolic Panel:  Recent Labs Lab 04/30/14 1112  NA 127*  K 4.2  CL 93*  CO2 24  GLUCOSE 97  BUN 10  CREATININE 0.74  CALCIUM 7.9*   Liver Function Tests:  Recent Labs  Lab 04/30/14 1112  AST 64*  ALT 47  ALKPHOS 105  BILITOT 4.8*  PROT 5.5*  ALBUMIN 1.9*    Recent Labs Lab 04/30/14 1112  LIPASE 42    Recent Labs Lab 04/30/14 1112  AMMONIA 14   CBC:  Recent Labs Lab 04/30/14 1112  WBC 10.8*  NEUTROABS 8.7*  HGB 9.3*  HCT 28.9*  MCV 86.3  PLT 68*    Radiographic Studies: Dg Chest 2 View  04/30/2014   CLINICAL DATA:  Liver failure. Now with shortness of breath and distended abdomen  EXAM: CHEST  2 VIEW  COMPARISON:  8/28/9  FINDINGS: Normal heart size. There is diffuse airspace opacification throughout the right lung. This is new from previous exam. Left lung is clear. No pleural effusions identified. 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  IMPRESSION: 1. Asymmetric opacification of the right lung, new from previous exam. This may represent multifocal pneumonia or asymmetric edema.   Electronically Signed   By: Kerby Moors M.D.   On: 04/30/2014 11:46     Assessment/Plan:   Principal Problem:   HAP (hospital-acquired pneumonia)  Continue empiric Zosyn and vancomycin.  HIV antibody, strep pneumonia/Legionella antigens, sputum culture and blood cultures ordered.  Narrow antibiotics based on clinical stability and culture data.   Active Problems:   Ascites  Will order diagnostic and therapeutic paracentesis. Send ascites sample for cell count/differential,  culture, protein and LDH as well as cytology.  Patient thinks he has been diagnosed with liver cancer but cannot provide any additional details. Await records from [facility] before repeating an extensive evaluation.    Hepatic cirrhosis due to chronic hepatitis C infection / hypoalbuminemia / coagulopathy / Thrombocytopenia  Have requested that the patient sign a consent form to allow [place] to have access to his records at [facility].  Appears to have significant liver failure with significant thrombocytopenia, hypoalbuminemia and coagulopathy.  Low-sodium diet.  Continue spironolactone as blood pressure tolerates. Continue lactulose.    Hyponatremia  Secondary to cirrhosis physiology. Monitor electrolytes.    Normocytic anemia  Likely secondary to anemia of chronic disease.    Depression with anxiety  Continue Celexa.    Severe protein calorie malnutrition  Dietitian consultation requested.    DVT prophylaxis  SCDs only given thrombocytopenia.  Code Status: Full. Family Communication: Wife, Eartha Inch [phone] is emergency contact. Disposition Plan: Home when stable.  Time spent: 70 minutes.  RAMA,CHRISTINA [facility] Pager [phone] Cell: [phone]   If 7PM-7AM, please contact night-coverage [email or web address] Password [email or web address] 04/30/2014, 4:15 PM

## 2014-05-01 ENCOUNTER — Inpatient Hospital Stay (HOSPITAL_COMMUNITY): Payer: Medicare Other

## 2014-05-01 DIAGNOSIS — J189 Pneumonia, unspecified organism: Secondary | ICD-10-CM | POA: Diagnosis present

## 2014-05-01 DIAGNOSIS — K7460 Unspecified cirrhosis of liver: Secondary | ICD-10-CM | POA: Diagnosis present

## 2014-05-01 DIAGNOSIS — K7290 Hepatic failure, unspecified without coma: Secondary | ICD-10-CM | POA: Diagnosis present

## 2014-05-01 DIAGNOSIS — D696 Thrombocytopenia, unspecified: Secondary | ICD-10-CM

## 2014-05-01 LAB — BODY FLUID CELL COUNT WITH DIFFERENTIAL
EOS FL: 1 %
Lymphs, Fluid: 34 %
Monocyte-Macrophage-Serous Fluid: 41 % — ABNORMAL LOW (ref 50–90)
NEUTROPHIL FLUID: 24 % (ref 0–25)
Total Nucleated Cell Count, Fluid: 66 cu mm (ref 0–1000)

## 2014-05-01 LAB — URINALYSIS, ROUTINE W REFLEX MICROSCOPIC
Glucose, UA: NEGATIVE mg/dL
HGB URINE DIPSTICK: NEGATIVE
Ketones, ur: NEGATIVE mg/dL
Leukocytes, UA: NEGATIVE
Nitrite: NEGATIVE
PH: 6 (ref 5.0–8.0)
Protein, ur: NEGATIVE mg/dL
SPECIFIC GRAVITY, URINE: 1.02 (ref 1.005–1.030)
Urobilinogen, UA: 1 mg/dL (ref 0.0–1.0)

## 2014-05-01 LAB — LACTATE DEHYDROGENASE, PLEURAL OR PERITONEAL FLUID: LD FL: 24 U/L — AB (ref 3–23)

## 2014-05-01 LAB — PROTEIN, BODY FLUID: Total protein, fluid: 0.3 g/dL

## 2014-05-01 LAB — HIV ANTIBODY (ROUTINE TESTING W REFLEX): HIV: NONREACTIVE

## 2014-05-01 MED ORDER — HYDROMORPHONE HCL 2 MG PO TABS
1.0000 mg | ORAL_TABLET | ORAL | Status: DC | PRN
  Administered 2014-05-02 (×3): 1 mg via ORAL
  Filled 2014-05-01 (×3): qty 1

## 2014-05-01 NOTE — Telephone Encounter (Signed)
Pt was admitted into the hospital.

## 2014-05-01 NOTE — Procedures (Signed)
US guided diagnostic/therapeutic paracentesis performed yielding 5 liters slightly turbid, yellow fluid. A portion of the fluid was sent to the lab for preordered studies. No immediate complications.

## 2014-05-01 NOTE — Progress Notes (Signed)
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 in 03/22/2014) who was admitted to [facility] 1 week back for decompensated liver cirrhosis with encephalopathy. He underwent therapeutic paracentesis (2 L ascites fluid removed) and IR guided Bland particle embolization of the right liver lobe mass on 10/27. Patient had developed hematoma over the right groin following the procedure. He was discharged from there on 11/3 with plan on repeat ablation possibly in 6 weeks prior to being prepared for liver transplant. Patient was stable on the day of discharge but since past 2 days started having worsening abdominal distention with cough and dyspnea..chest x-ray on admission concerning for multifocal pneumonia versus asymmetric edema. Patient admitted with worsening ascites and possible healthcare associated pneumonia.  Assessment/Plan: Decompensated hep C cirrhosis Patient had associated hyponatremia, thrombocytopenia and coagulopathy. Recently hospitalized to [facility] for therapeutic paracenteses. He now has a right liver lobe mass and underwent IR embolization 1 week back. Patient is being evaluated there for transplant. -His LFTs, INR and sodium level are days and do not appear to have changed from previous labs. We'll continue to monitor. No signs of bleeding. -Continue Lasix and Aldactone. Continue lactulose titrating to at least 2-3 bowel movements daily. No signs of encephalopathy. -ordered for diagnostic and therapeutic paracentesis.no signs of SBP on clinical exam  Healthcare associated pneumonia Placed on empiric vancomycin and Zosyn. Dyspnea improved as per patient. Check urine for strep and Legionella antigen. HIV  antibody and blood cultures sent on admission. Supportive care with Tylenol and antitussives.  Hyponatremia Secondary to cirrhosis.monitor for now.  Anemia of chronic disease Hemoglobin currently stable.  Anxiety and depression Continue Celexa  Protein calorie malnutrition, severe Nutritionist consulted  DVT prophylaxis: SCD, high INR And low  platelets  Code Status: full code Family Communication: none at bedside Disposition Plan: home once improved   Consultants:  none  Procedures:  for paracentesis  Antibiotics:  IV vancomycin and Zosyn since 11/5  HPI/Subjective: Patient seen and examined. He reports his dyspnea to be better.  Objective: Filed Vitals:   05/01/14 1032  BP: 102/57  Pulse: 104  Temp: 98.7 F (37.1 C)  Resp: 18    Intake/Output Summary (Last 24 hours) at 05/01/14 1247 Last data filed at 05/01/14 0600  Gross per 24 hour  Intake    540 ml  Output      0 ml  Net    540 ml   Filed Weights   04/30/14 1230  Weight: 76.204 kg (168 lb)    Exam:   General:  Middle aged thin built male in no acute distress  HEENT: Pallor present, icteric, moist oral mucosa,  Chest: Clear to auscultation bilaterally, no added sounds  CVS: Normal S1 and S2, no murmurs  Abdomen: Distended with ascites, nontender, bowel sounds present  Extremities: Warm, trace edema  CNS: Alert and oriented, no tremors    Data Reviewed: Basic Metabolic Panel:  Recent Labs Lab 04/30/14 1112  NA 127*  K 4.2  CL 93*  CO2 24  GLUCOSE 97  BUN 10  CREATININE 0.74  CALCIUM 7.9*   Liver Function Tests:  Recent Labs Lab 04/30/14 1112  AST 64*  ALT 47  ALKPHOS 105  BILITOT 4.8*  PROT 5.5*  ALBUMIN  1.9*    Recent Labs Lab 04/30/14 1112  LIPASE 42    Recent Labs Lab 04/30/14 1112  AMMONIA 14   CBC:  Recent Labs Lab 04/30/14 1112  WBC 10.8*  NEUTROABS 8.7*  HGB 9.3*  HCT 28.9*  MCV 86.3  PLT 68*   Cardiac Enzymes: No results for  input(s): CKTOTAL, CKMB, CKMBINDEX, TROPONINI in the last 168 hours. BNP (last 3 results) No results for input(s): PROBNP in the last 8760 hours. CBG: No results for input(s): GLUCAP in the last 168 hours.  Recent Results (from the past 240 hour(s))  Culture, blood (routine x 2)     Status: None (Preliminary result)   Collection Time: 04/30/14 12:56 PM  Result Value Ref Range Status   Specimen Description BLOOD LEFT ARM  Final   Special Requests BOTTLES DRAWN AEROBIC AND ANAEROBIC 5 CC EACH  Final   Culture  Setup Time   Final    04/30/2014 17:08 Performed at [facility]    Culture   Final           BLOOD CULTURE RECEIVED NO GROWTH TO DATE CULTURE WILL BE HELD FOR 5 DAYS BEFORE ISSUING A FINAL NEGATIVE REPORT Performed at [facility]    Report Status PENDING  Incomplete  Culture, blood (routine x 2)     Status: None (Preliminary result)   Collection Time: 04/30/14 12:57 PM  Result Value Ref Range Status   Specimen Description BLOOD RIGHT HAND  Final   Special Requests BOTTLES DRAWN AEROBIC AND ANAEROBIC 5 CC  Final   Culture  Setup Time   Final    04/30/2014 17:08 Performed at [facility]    Culture   Final           BLOOD CULTURE RECEIVED NO GROWTH TO DATE CULTURE WILL BE HELD FOR 5 DAYS BEFORE ISSUING A FINAL NEGATIVE REPORT Performed at [facility]    Report Status PENDING  Incomplete     Studies: Dg Chest 2 View  04/30/2014   CLINICAL DATA:  Liver failure. Now with shortness of breath and distended abdomen  EXAM: CHEST  2 VIEW  COMPARISON:  8/28/9  FINDINGS: Normal heart size. There is diffuse airspace opacification throughout the right lung. This is new from previous exam. Left lung is clear. No pleural effusions identified. 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  IMPRESSION: 1. Asymmetric opacification of the right lung, new from previous exam. This may represent multifocal pneumonia or asymmetric edema.    Electronically Signed   By: Kerby Moors M.D.   On: 04/30/2014 11:46    Scheduled Meds: . balsalazide  2,250 mg Oral TID  . citalopram  20 mg Oral Daily  . furosemide  40 mg Oral Daily  . piperacillin-tazobactam (ZOSYN)  IV  3.375 g Intravenous 3 times per day  . spironolactone  100 mg Oral Daily  . vancomycin  1,000 mg Intravenous Q12H   Continuous Infusions:     Time spent: 25 minutes    Genora Arp  [facility] Pager [phone] 7PM-7AM, please contact night-coverage at [email or web address], password [facility] 05/01/2014, 12:47 PM  LOS: 1 day

## 2014-05-01 NOTE — Progress Notes (Signed)
INITIAL NUTRITION ASSESSMENT  DOCUMENTATION CODES Per approved criteria  -Not Applicable   INTERVENTION: 1.  General healthful diet; encourage intake of foods and beverages as able.  RD to follow and assess for nutritional adequacy.  2.  Supplements; pt declines at this time.   NUTRITION DIAGNOSIS: Inadequate oral intake related to poor appetite as evidenced by ascites.   Monitor:  1.  Food/Beverage; pt meeting >/=90% estimated needs with tolerance. 2.  Wt/wt change; monitor trends  Reason for Assessment: consult  58 y.o. male  Admitting Dx: HAP (hospital-acquired pneumonia)  ASSESSMENT: Pt admitted with abdominal distention, dyspnea.  Pt with significant PMH cirrhosis of the liver secondary to Hep C and possible liver cancer. Pt recently had paracentesis however continues with abdominal distention. Pt also with cough and found to have HAP.   RD met with pt who endorses following a low sodium diet at home.  Pt states he eats well at home with the help of his wife.  He typically eats eggs, low-sodium soups, yogurt, and feels he manages his nutrition well.  Pt states that his weights are difficult to follow due to ascites, however he feels he has not lost muscle or fat mass and actual weight is stable.  Patient states he is followed by a PCP and specialist at [facility]  Nutrition Focused Physical Exam: Subcutaneous Fat:  Orbital Region: WNL Upper Arm Region: WNL Thoracic and Lumbar Region: WNL, may be masked by fluid status  Muscle:  Temple Region: WNL Clavicle Bone Region: mild wasting, may be masked by fluid status Clavicle and Acromion Bone Region: mild wasting, may be masked by fluid status Scapular Bone Region: WNL Dorsal Hand: WNL Patellar Region: WNL Anterior Thigh Region: WNL Posterior Calf Region: WNL  Edema: ascites  Weight status is concerning for significant change, however given patient report of adequate intake at home without change in muscle or fat mass,  patient does not meet criteria for malnutrition at this time.  RD discussed availability of supplements, however pt declines at this time.   Pt currently eating during visit.  RD to follow.   Height: Ht Readings from Last 1 Encounters:  04/30/14 5' 9" (1.753 m)    Weight: Wt Readings from Last 1 Encounters:  04/30/14 168 lb (76.204 kg)    Ideal Body Weight: 70 kg  % Ideal Body Weight: 108%  Wt Readings from Last 10 Encounters:  04/30/14 168 lb (76.204 kg)  01/21/14 176 lb 12.9 oz (80.2 kg)  10/06/10 210 lb (95.255 kg)  09/26/10 211 lb 3.2 oz (95.8 kg)  09/28/09 202 lb 3.2 oz (91.717 kg)  09/20/09 206 lb 2.1 oz (93.501 kg)  08/06/09 199 lb 9.6 oz (90.538 kg)  06/30/09 201 lb 12.8 oz (91.536 kg)  06/15/09 202 lb 12.8 oz (91.989 kg)  01/26/09 206 lb 6.4 oz (93.622 kg)    Usual Body Weight: 200 lbs  % Usual Body Weight: 84%  BMI:  Body mass index is 24.8 kg/(m^2).  Estimated Nutritional Needs: Kcal: 2000-2200 Protein: 80-90g Fluid: ~2.0 L/day or per MD discretion  Skin: intakct  Diet Order: Diet 2 gram sodium  EDUCATION NEEDS: -Education needs addressed   Intake/Output Summary (Last 24 hours) at 05/01/14 1150 Last data filed at 05/01/14 0600  Gross per 24 hour  Intake    540 ml  Output      0 ml  Net    540 ml    Last BM: 11/5   Labs:   Recent Labs Lab  04/30/14 1112  NA 127*  K 4.2  CL 93*  CO2 24  BUN 10  CREATININE 0.74  CALCIUM 7.9*  GLUCOSE 97    CBG (last 3)  No results for input(s): GLUCAP in the last 72 hours.  Scheduled Meds: . balsalazide  2,250 mg Oral TID  . citalopram  20 mg Oral Daily  . furosemide  40 mg Oral Daily  . piperacillin-tazobactam (ZOSYN)  IV  3.375 g Intravenous 3 times per day  . spironolactone  100 mg Oral Daily  . vancomycin  1,000 mg Intravenous Q12H    Continuous Infusions:   Past Medical History  Diagnosis Date  . Hepatitis C   . Anxiety     dx w/ depression 07/2008  . Colitis   . Alcohol  abuse, in remission   . Headache(784.0)   . Allergic rhinitis   . Hepatic failure due to alcoholism   . Ulcerative colitis   . Coagulopathy 01/21/2014  . Severe protein-calorie malnutrition 01/21/2014  . Depression with anxiety 04/30/2014  . Ascites 01/21/2014  . ALLERGIC RHINITIS 04/28/2008    Qualifier: Diagnosis of  By: Larose Kells MD, [place] 04/04/2007    Annotation: cscope 08-2005 (had diarrhea) Qualifier: Diagnosis of  By: Larose Kells MD, [place] HYPERSOMNIA, ASSOCIATED WITH SLEEP APNEA 09/20/2009    Qualifier: Diagnosis of  By: Elsworth Soho MD, [place], HX OF 06/15/2009    Qualifier: Diagnosis of  By: Inda Castle FNP, [place]   . HTN (hypertension) 09/26/2010  . Liver failure 01/15/2014  . Hepatic cirrhosis due to chronic hepatitis C infection 01/15/2014  . Anemia 01/19/2014  . Thrombocytopenia 01/19/2014  . Encephalopathy, hepatic 01/21/2014  . Hyponatremia 04/30/2014  . Hypoalbuminemia 04/30/2014    Past Surgical History  Procedure Laterality Date  . Tonsillectomy      Brynda Greathouse, MS RD LDN Clinical Inpatient Dietitian Weekend/After hours pager: [phone]

## 2014-05-02 LAB — COMPREHENSIVE METABOLIC PANEL
ALBUMIN: 1.5 g/dL — AB (ref 3.5–5.2)
ALK PHOS: 109 U/L (ref 39–117)
ALT: 37 U/L (ref 0–53)
ALT: 38 U/L (ref 0–53)
ANION GAP: 7 (ref 5–15)
AST: 66 U/L — AB (ref 0–37)
AST: 60 U/L — AB (ref 0–37)
Albumin: 1.6 g/dL — ABNORMAL LOW (ref 3.5–5.2)
Alkaline Phosphatase: 103 U/L (ref 39–117)
Anion gap: 8 (ref 5–15)
BILIRUBIN TOTAL: 4.1 mg/dL — AB (ref 0.3–1.2)
BILIRUBIN TOTAL: 3.8 mg/dL — AB (ref 0.3–1.2)
BUN: 9 mg/dL (ref 6–23)
BUN: 9 mg/dL (ref 6–23)
CHLORIDE: 98 meq/L (ref 96–112)
CO2: 25 meq/L (ref 19–32)
CO2: 27 mEq/L (ref 19–32)
Calcium: 7.3 mg/dL — ABNORMAL LOW (ref 8.4–10.5)
Calcium: 7.3 mg/dL — ABNORMAL LOW (ref 8.4–10.5)
Chloride: 94 mEq/L — ABNORMAL LOW (ref 96–112)
Creatinine, Ser: 0.82 mg/dL (ref 0.50–1.35)
Creatinine, Ser: 0.88 mg/dL (ref 0.50–1.35)
GFR calc Af Amer: >90 mL/min (ref >90)
GFR calc non Af Amer: >90 mL/min (ref >90)
GFR calc non Af Amer: >90 mL/min (ref >90)
Glucose, Bld: 90 mg/dL (ref 70–99)
Glucose, Bld: 154 mg/dL — ABNORMAL HIGH (ref 70–99)
POTASSIUM: 4.6 meq/L (ref 3.7–5.3)
Potassium: 4.3 mEq/L (ref 3.7–5.3)
SODIUM: 131 meq/L — AB (ref 137–147)
Sodium: 128 mEq/L — ABNORMAL LOW (ref 137–147)
TOTAL PROTEIN: 5.1 g/dL — AB (ref 6.0–8.3)
Total Protein: 4.8 g/dL — ABNORMAL LOW (ref 6.0–8.3)

## 2014-05-02 LAB — EXPECTORATED SPUTUM ASSESSMENT W REFEX TO RESP CULTURE: SPECIAL REQUESTS: NORMAL

## 2014-05-02 LAB — CBC
HCT: 25.5 % — ABNORMAL LOW (ref 39.0–52.0)
HEMOGLOBIN: 8 g/dL — AB (ref 13.0–17.0)
MCH: 27.4 pg (ref 26.0–34.0)
MCHC: 31.4 g/dL (ref 30.0–36.0)
MCV: 87.3 fL (ref 78.0–100.0)
PLATELETS: 57 10*3/uL — AB (ref 150–400)
RBC: 2.92 MIL/uL — AB (ref 4.22–5.81)
RDW: 21.1 % — ABNORMAL HIGH (ref 11.5–15.5)
WBC: 9.3 10*3/uL (ref 4.0–10.5)

## 2014-05-02 LAB — STREP PNEUMONIAE URINARY ANTIGEN: Strep Pneumo Urinary Antigen: NEGATIVE

## 2014-05-02 LAB — PROTIME-INR
INR: 2.61 — AB (ref 0.00–1.49)
Prothrombin Time: 28.2 seconds — ABNORMAL HIGH (ref 11.6–15.2)

## 2014-05-02 MED ORDER — MIRTAZAPINE 7.5 MG PO TABS
7.5000 mg | ORAL_TABLET | Freq: Every day | ORAL | Status: DC
Start: 2014-05-02 — End: 2014-05-04
  Administered 2014-05-02 – 2014-05-03 (×2): 7.5 mg via ORAL
  Filled 2014-05-02 (×3): qty 1

## 2014-05-02 MED ORDER — HYDROMORPHONE HCL 2 MG PO TABS
2.0000 mg | ORAL_TABLET | ORAL | Status: DC | PRN
  Administered 2014-05-02 – 2014-05-04 (×8): 2 mg via ORAL
  Filled 2014-05-02 (×8): qty 1

## 2014-05-02 MED ORDER — LACTULOSE 10 GM/15ML PO SOLN
20.0000 g | Freq: Three times a day (TID) | ORAL | Status: DC | PRN
  Filled 2014-05-02: qty 30

## 2014-05-02 NOTE — Progress Notes (Signed)
[facility] PROGRESS NOTE  Bradley Woods SLH:[id number] DOB: 1956/03/18 DOA: 04/30/2014 PCP: [name], MD  Brief narrative 58 year old male with decompensated hep C cirrhosis on transplant list at [facility], recently diagnosed 3 cm lesion over right lobe of the liver consistent with [facility] (as per CT scan at [place] in 03/22/2014) who was admitted to [facility] 1 week back for decompensated liver cirrhosis with encephalopathy. He underwent therapeutic paracentesis (2 L ascites fluid removed) and IR guided Bland particle embolization of the right liver lobe mass on 10/27. Patient had developed hematoma over the right groin following the procedure. He was discharged from there on 11/3 with plan on repeat ablation possibly in 6 weeks prior to being prepared for liver transplant. Patient was stable on the day of discharge but since past 2 days started having worsening abdominal distention with cough and dyspnea..chest x-ray on admission concerning for multifocal pneumonia versus asymmetric edema. Patient admitted with worsening ascites and possible healthcare associated pneumonia.  Assessment/Plan: Decompensated hep C cirrhosis Patient had associated hyponatremia, thrombocytopenia and coagulopathy. Recently hospitalized to [facility] for therapeutic paracenteses. He now has a right liver lobe mass and underwent IR embolization 1 week back. Patient is being evaluated there for transplant. -patient underwent therapeutic paracentesis with 5 L of clear ascites fluid removed and symptomatically improved. Negative for SBP.( cell count and cultures both negative) -His LFTs, INR and sodium level are abnormal and do not appear to have changed from previous labs. continue to monitor. No signs of bleeding. Sodium level has improved in a.m. labs -Continue Lasix and Aldactone. Patient showing signs of mild encephalopathy with increased somnolence.will increase lactulose to titrate to at least 3 bowel movements  daily.    Healthcare associated pneumonia -on empiric vancomycin and Zosyn. Dyspnea slowly improved as per patient. Blood cultures negative. Supportive care with Tylenol and antitussives. -transition to oral Levaquin upon discharge  Hyponatremia Secondary to cirrhosis.monitor for now.  Anemia of chronic disease Hemoglobin currently stable.  Anxiety and depression Continue Celexa  Protein calorie malnutrition, severe Nutritionist consulted  DVT prophylaxis: SCD, high INR And low platelets  Code Status: full code Family Communication: wife at bedside Disposition Plan: home likely tomorrow   Consultants:  IR  Procedures:  Therapeutic paracentesis on 11/6  Antibiotics:  IV Zosyn  HPI/Subjective: Patient seen and examined. Had 5 L ultrasound-guided paracentesis done yesterday. Negative for SBP. Feels better except for still having difficulty with some shortness of breath  Objective: Filed Vitals:   05/02/14 0552  BP: 112/60  Pulse: 82  Temp: 98.1 F (36.7 C)  Resp: 20    Intake/Output Summary (Last 24 hours) at 05/02/14 1417 Last data filed at 05/01/14 1758  Gross per 24 hour  Intake      0 ml  Output    525 ml  Net   -525 ml   Filed Weights   04/30/14 1230  Weight: 76.204 kg (168 lb)    Exam:   General:  Middle aged male in no acute distress, appears sleepy during conversation  HEENT:pallor present, indicated, moist oral mucosa  Chest: Clear to auscultation bilaterally  CVS: Normal S1 and S2  Abdomen: Mildly distended, nontender, bowel sounds present  Images: Trace edema  CNS: Alert and oriented but tends to fall asleep during conversation, No tremors   Data Reviewed: Basic Metabolic Panel:  Recent Labs Lab 04/30/14 1112 05/02/14 0518  NA 127* 131*  K 4.2 4.6  CL 93* 98  CO2 24 25  GLUCOSE 97  90  BUN 10 9  CREATININE 0.74 0.82  CALCIUM 7.9* 7.3*   Liver Function Tests:  Recent Labs Lab 04/30/14 1112 05/02/14 0518  AST  64* 66*  ALT 47 37  ALKPHOS 105 103  BILITOT 4.8* 4.1*  PROT 5.5* 4.8*  ALBUMIN 1.9* 1.6*    Recent Labs Lab 04/30/14 1112  LIPASE 42    Recent Labs Lab 04/30/14 1112  AMMONIA 14   CBC:  Recent Labs Lab 04/30/14 1112 05/02/14 0518  WBC 10.8* 9.3  NEUTROABS 8.7*  --   HGB 9.3* 8.0*  HCT 28.9* 25.5*  MCV 86.3 87.3  PLT 68* 57*   Cardiac Enzymes: No results for input(s): CKTOTAL, CKMB, CKMBINDEX, TROPONINI in the last 168 hours. BNP (last 3 results) No results for input(s): PROBNP in the last 8760 hours. CBG: No results for input(s): GLUCAP in the last 168 hours.  Recent Results (from the past 240 hour(s))  Culture, blood (routine x 2)     Status: None (Preliminary result)   Collection Time: 04/30/14 12:56 PM  Result Value Ref Range Status   Specimen Description BLOOD LEFT ARM  Final   Special Requests BOTTLES DRAWN AEROBIC AND ANAEROBIC 5 CC EACH  Final   Culture  Setup Time   Final    04/30/2014 17:08 Performed at [facility]    Culture   Final           BLOOD CULTURE RECEIVED NO GROWTH TO DATE CULTURE WILL BE HELD FOR 5 DAYS BEFORE ISSUING A FINAL NEGATIVE REPORT Performed at [facility]    Report Status PENDING  Incomplete  Culture, blood (routine x 2)     Status: None (Preliminary result)   Collection Time: 04/30/14 12:57 PM  Result Value Ref Range Status   Specimen Description BLOOD RIGHT HAND  Final   Special Requests BOTTLES DRAWN AEROBIC AND ANAEROBIC 5 CC  Final   Culture  Setup Time   Final    04/30/2014 17:08 Performed at [facility]    Culture   Final           BLOOD CULTURE RECEIVED NO GROWTH TO DATE CULTURE WILL BE HELD FOR 5 DAYS BEFORE ISSUING A FINAL NEGATIVE REPORT Performed at [facility]    Report Status PENDING  Incomplete  Body fluid culture     Status: None (Preliminary result)   Collection Time: 05/01/14  2:24 PM  Result Value Ref Range Status   Specimen Description PERITONEAL CAVITY   Final   Special Requests Normal  Final   Gram Stain   Final    NO WBC SEEN NO ORGANISMS SEEN Performed at [facility]    Culture PENDING  Incomplete   Report Status PENDING  Incomplete  Culture, sputum-assessment     Status: None   Collection Time: 05/02/14 10:43 AM  Result Value Ref Range Status   Specimen Description SPUTUM  Final   Special Requests Normal  Final   Sputum evaluation   Final    THIS SPECIMEN IS ACCEPTABLE. RESPIRATORY CULTURE REPORT TO FOLLOW.   Report Status 05/02/2014 FINAL  Final     Studies: US Paracentesis  05/01/2014   INDICATION: Cirrhosis, hepatitis-C, recurrent ascites. Request is made for diagnostic and therapeutic paracentesis up to 5 liters.  EXAM: ULTRASOUND-GUIDED DIAGNOSTIC AND THERAPEUTIC PARACENTESIS  COMPARISON:  PRIOR PARACENTESIS ON 01/19/2014  MEDICATIONS: None.  COMPLICATIONS: None immediate  TECHNIQUE: Informed written consent was obtained from the patient after a discussion of  the risks, benefits and alternatives to treatment. A timeout was performed prior to the initiation of the procedure.  Initial ultrasound scanning demonstrates a large amount of ascites within the right lower abdominal quadrant. The right lower abdomen was prepped and draped in the usual sterile fashion. 1% lidocaine was used for local anesthesia. Under direct ultrasound guidance, a 19 gauge, 7-cm, Yueh catheter was introduced. An ultrasound image was saved for documentation purposed. The paracentesis was performed. The catheter was removed and a dressing was applied. The patient tolerated the procedure well without immediate post procedural complication.  FINDINGS: A total of approximately 5 liters liters of slightly turbid, yellow fluid was removed. Samples were sent to the laboratory as requested by the clinical team.  IMPRESSION: Successful ultrasound-guided diagnostic and therapeutic paracentesis yielding 5 liters of peritoneal fluid.  Read by: Rowe Robert, PA-C    Electronically Signed   By: Aletta Edouard M.D.   On: 05/01/2014 14:34    Scheduled Meds: . balsalazide  2,250 mg Oral TID  . citalopram  20 mg Oral Daily  . furosemide  40 mg Oral Daily  . piperacillin-tazobactam (ZOSYN)  IV  3.375 g Intravenous 3 times per day  . spironolactone  100 mg Oral Daily  . vancomycin  1,000 mg Intravenous Q12H   Continuous Infusions:    Time spent: 25 minutes    Mayan Kloepfer, [place]  [facility] Pager [phone]. If 7PM-7AM, please contact night-coverage at [email or web address], password [facility] 05/02/2014, 2:17 PM  LOS: 2 days

## 2014-05-03 LAB — CBC
HEMATOCRIT: 29.5 % — AB (ref 39.0–52.0)
HEMOGLOBIN: 9.6 g/dL — AB (ref 13.0–17.0)
MCH: 28.2 pg (ref 26.0–34.0)
MCHC: 32.5 g/dL (ref 30.0–36.0)
MCV: 86.8 fL (ref 78.0–100.0)
Platelets: 68 10*3/uL — ABNORMAL LOW (ref 150–400)
RBC: 3.4 MIL/uL — ABNORMAL LOW (ref 4.22–5.81)
RDW: 20.6 % — AB (ref 11.5–15.5)
WBC: 15 10*3/uL — AB (ref 4.0–10.5)

## 2014-05-03 LAB — BASIC METABOLIC PANEL
Anion gap: 9 (ref 5–15)
BUN: 8 mg/dL (ref 6–23)
CHLORIDE: 90 meq/L — AB (ref 96–112)
CO2: 24 mEq/L (ref 19–32)
Calcium: 7.4 mg/dL — ABNORMAL LOW (ref 8.4–10.5)
Creatinine, Ser: 0.71 mg/dL (ref 0.50–1.35)
GFR calc Af Amer: >90 mL/min (ref >90)
GFR calc non Af Amer: >90 mL/min (ref >90)
GLUCOSE: 137 mg/dL — AB (ref 70–99)
POTASSIUM: 4.2 meq/L (ref 3.7–5.3)
Sodium: 123 mEq/L — ABNORMAL LOW (ref 137–147)

## 2014-05-03 MED ORDER — LACTULOSE 10 GM/15ML PO SOLN
30.0000 g | Freq: Three times a day (TID) | ORAL | Status: DC
  Administered 2014-05-03 – 2014-05-04 (×4): 30 g via ORAL
  Filled 2014-05-03 (×6): qty 45

## 2014-05-03 MED ORDER — SPIRONOLACTONE 50 MG PO TABS
50.0000 mg | ORAL_TABLET | Freq: Every day | ORAL | Status: DC
  Administered 2014-05-04: 50 mg via ORAL
  Filled 2014-05-03: qty 1

## 2014-05-03 MED ORDER — LACTULOSE 10 GM/15ML PO SOLN: 30.0000 g | Freq: Three times a day (TID) | ORAL | Status: DC | PRN

## 2014-05-03 MED ORDER — LEVOFLOXACIN 750 MG PO TABS
750.0000 mg | ORAL_TABLET | Freq: Every day | ORAL | Status: DC
  Administered 2014-05-03 – 2014-05-04 (×2): 750 mg via ORAL
  Filled 2014-05-03 (×2): qty 1

## 2014-05-03 MED ORDER — FUROSEMIDE 20 MG PO TABS: 20.0000 mg | ORAL_TABLET | Freq: Every day | ORAL | Status: DC

## 2014-05-03 NOTE — Progress Notes (Signed)
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 He underwent therapeutic paracentesis (2 L ascites fluid removed) and IR guided Bland particle embolization of the right liver lobe mass on 10/27. Patient had developed hematoma over the right groin following the procedure. He was discharged from there on 11/3 with plan on repeat ablation possibly in 6 weeks prior to being prepared for liver transplant. Patient was stable on the day of discharge but since past 2 days started having worsening abdominal distention with cough and dyspnea..chest x-ray on admission concerning for multifocal pneumonia versus asymmetric edema. Patient admitted with worsening ascites and possible healthcare associated pneumonia.  Assessment/Plan: Decompensated hep C cirrhosis patient presented with significant ascites and associated hyponatremia, thrombocytopenia and coagulopathy . Recently hospitalized to [facility] for therapeutic paracenteses. He now has a right liver lobe mass and underwent IR embolization on 10/27. Patient is being evaluated there for transplant. -therapeutic paracentesis with 5 L of clear ascites fluid removed on 11/6 and symptomatically improved. Negative for SBP. -transaminitis, coagulopathy and thrombocytopenia is chronic from his decompensated liver disease and unchanged. -Patient was taken off Lasix and Aldactone when he was hospitalized at [facility] recently for severe hyponatremia with sodium less than 120. Since his sodium level improved I put him black on his Lasix and Aldactone but his sodium level has dropped down  this morning to 123 and patient feels tremulous.. I would hold his Lasix and reduce his Aldactone dose 50 mg once a day and monitor.. -patient has not had bowel movement since yesterday. Advanced lactulose to 30 mg 3 times a day scheduled titrating to at least 3 bowel movements daily.    Healthcare associated pneumonia -on empiric vancomycin and Zosyn. Will transition to oral Levaquin.Dyspnea improved. Blood cultures negative. Supportive care with Tylenol and antitussives.   Hyponatremia Secondary to cirrhosis and now worsened. Will hold Lasix and reduce Aldactone dose.  Anemia of chronic disease Hemoglobin currently stable.  Anxiety and depression Continue Remeron. (Patient was on Celexa previously and switch to Remeron recently)  Protein calorie malnutrition, severe Nutritionist consulted  DVT prophylaxis: SCD, high INR And low platelets  Code Status: full code Family Communication: wife at bedside Disposition Plan: home likely tomorrow if improved   Consultants:  IR  Procedures:  Therapeutic paracentesis on 11/6  Antibiotics:  IV Zosyn  HPI/Subjective: Patient seen and examined. reports being shaky. He denies any abdominal pain and dyspnea has improved. Has not had bowel movement since yesterday.  Objective: Filed Vitals:   05/03/14 0716  BP: 111/76  Pulse: 98  Temp: 97.9 F (36.6 C)  Resp: 20    Intake/Output Summary (Last 24 hours) at 05/03/14 1048 Last data filed at 05/03/14 0825  Gross per 24 hour  Intake    480 ml  Output   1750 ml  Net  -1270 ml   Filed Weights   04/30/14 1230  Weight: 76.204 kg (168 lb)    Exam:   General:  Middle aged male in no acute distress, appears fatigued but alert and awake  HEENT:pallor present,icteric,  moist oral mucosa  Chest: Clear to auscultation bilaterally  CVS: Normal S1 and S2  Abdomen:  distended, nontender, bowel sounds present  Images: Trace edema  CNS: Alert and oriented , mildly  tremulous  Data Reviewed: Basic Metabolic Panel:  Recent Labs Lab 04/30/14 1112 05/02/14 0518 05/02/14 1516 05/03/14 0947  NA 127* 131* 128* 123*  K 4.2 4.6 4.3 4.2  CL 93* 98 94* 90*  CO2 24 25 27 24   GLUCOSE 97 90 154* 137*  BUN 10 9 9 8   CREATININE 0.74 0.82 0.88 0.71  CALCIUM 7.9* 7.3* 7.3* 7.4*   Liver Function Tests:  Recent Labs Lab 04/30/14 1112 05/02/14 0518 05/02/14 1516  AST 64* 66* 60*  ALT 47 37 38  ALKPHOS 105 103 109  BILITOT 4.8* 4.1* 3.8*  PROT 5.5* 4.8* 5.1*  ALBUMIN 1.9* 1.6* 1.5*    Recent Labs Lab 04/30/14 1112  LIPASE 42    Recent Labs Lab 04/30/14 1112  AMMONIA 14   CBC:  Recent Labs Lab 04/30/14 1112 05/02/14 0518 05/03/14 0544  WBC 10.8* 9.3 15.0*  NEUTROABS 8.7*  --   --   HGB 9.3* 8.0* 9.6*  HCT 28.9* 25.5* 29.5*  MCV 86.3 87.3 86.8  PLT 68* 57* 68*   Cardiac Enzymes: No results for input(s): CKTOTAL, CKMB, CKMBINDEX, TROPONINI in the last 168 hours. BNP (last 3 results) No results for input(s): PROBNP in the last 8760 hours. CBG: No results for input(s): GLUCAP in the last 168 hours.  Recent Results (from the past 240 hour(s))  Culture, blood (routine x 2)     Status: None (Preliminary result)   Collection Time: 04/30/14 12:56 PM  Result Value Ref Range Status   Specimen Description BLOOD LEFT ARM  Final   Special Requests BOTTLES DRAWN AEROBIC AND ANAEROBIC 5 CC EACH  Final   Culture  Setup Time   Final    04/30/2014 17:08 Performed at [facility]    Culture   Final           BLOOD CULTURE RECEIVED NO GROWTH TO DATE CULTURE WILL BE HELD FOR 5 DAYS BEFORE ISSUING A FINAL NEGATIVE REPORT Performed at [facility]    Report Status PENDING  Incomplete  Culture, blood (routine x 2)     Status: None (Preliminary result)   Collection Time: 04/30/14 12:57 PM  Result Value Ref Range Status   Specimen Description BLOOD RIGHT HAND  Final   Special Requests BOTTLES DRAWN AEROBIC AND ANAEROBIC 5 CC   Final   Culture  Setup Time   Final    04/30/2014 17:08 Performed at [facility]    Culture   Final           BLOOD CULTURE RECEIVED NO GROWTH TO DATE CULTURE WILL BE HELD FOR 5 DAYS BEFORE ISSUING A FINAL NEGATIVE REPORT Performed at [facility]    Report Status PENDING  Incomplete  Body fluid culture     Status: None (Preliminary result)   Collection Time: 05/01/14  2:24 PM  Result Value Ref Range Status   Specimen Description PERITONEAL CAVITY  Final   Special Requests Normal  Final   Gram Stain   Final    NO WBC SEEN NO ORGANISMS SEEN Performed at [facility]    Culture PENDING  Incomplete   Report Status PENDING  Incomplete  Culture, sputum-assessment     Status: None   Collection Time: 05/02/14 10:43 AM  Result Value Ref Range Status   Specimen Description SPUTUM  Final   Special Requests Normal  Final  Sputum evaluation   Final    THIS SPECIMEN IS ACCEPTABLE. RESPIRATORY CULTURE REPORT TO FOLLOW.   Report Status 05/02/2014 FINAL  Final     Studies: US Paracentesis  05/01/2014   INDICATION: Cirrhosis, hepatitis-C, recurrent ascites. Request is made for diagnostic and therapeutic paracentesis up to 5 liters.  EXAM: ULTRASOUND-GUIDED DIAGNOSTIC AND THERAPEUTIC PARACENTESIS  COMPARISON:  PRIOR PARACENTESIS ON 01/19/2014  MEDICATIONS: None.  COMPLICATIONS: None immediate  TECHNIQUE: Informed written consent was obtained from the patient after a discussion of the risks, benefits and alternatives to treatment. A timeout was performed prior to the initiation of the procedure.  Initial ultrasound scanning demonstrates a large amount of ascites within the right lower abdominal quadrant. The right lower abdomen was prepped and draped in the usual sterile fashion. 1% lidocaine was used for local anesthesia. Under direct ultrasound guidance, a 19 gauge, 7-cm, Yueh catheter was introduced. An ultrasound image was saved for documentation purposed. The  paracentesis was performed. The catheter was removed and a dressing was applied. The patient tolerated the procedure well without immediate post procedural complication.  FINDINGS: A total of approximately 5 liters liters of slightly turbid, yellow fluid was removed. Samples were sent to the laboratory as requested by the clinical team.  IMPRESSION: Successful ultrasound-guided diagnostic and therapeutic paracentesis yielding 5 liters of peritoneal fluid.  Read by: Rowe Robert, PA-C   Electronically Signed   By: Aletta Edouard M.D.   On: 05/01/2014 14:34    Scheduled Meds: . lactulose  30 g Oral TID  . mirtazapine  7.5 mg Oral QHS  . piperacillin-tazobactam (ZOSYN)  IV  3.375 g Intravenous 3 times per day  . [START ON 05/04/2014] spironolactone  50 mg Oral Daily  . vancomycin  1,000 mg Intravenous Q12H   Continuous Infusions:    Time spent: 25 minutes    Ivanell Deshotel, [place]  [facility] Pager [phone]. If 7PM-7AM, please contact night-coverage at [email or web address], password [facility] 05/03/2014, 10:48 AM  LOS: 3 days

## 2014-05-04 ENCOUNTER — Inpatient Hospital Stay (HOSPITAL_COMMUNITY): Payer: Medicare Other

## 2014-05-04 ENCOUNTER — Telehealth: Payer: Self-pay | Admitting: Internal Medicine

## 2014-05-04 DIAGNOSIS — C220 Liver cell carcinoma: Secondary | ICD-10-CM | POA: Diagnosis present

## 2014-05-04 DIAGNOSIS — K7682 Hepatic encephalopathy: Secondary | ICD-10-CM | POA: Diagnosis present

## 2014-05-04 DIAGNOSIS — K7290 Hepatic failure, unspecified without coma: Secondary | ICD-10-CM | POA: Diagnosis present

## 2014-05-04 LAB — BASIC METABOLIC PANEL
ANION GAP: 11 (ref 5–15)
BUN: 7 mg/dL (ref 6–23)
CALCIUM: 7.8 mg/dL — AB (ref 8.4–10.5)
CO2: 26 mEq/L (ref 19–32)
Chloride: 87 mEq/L — ABNORMAL LOW (ref 96–112)
Creatinine, Ser: 0.67 mg/dL (ref 0.50–1.35)
GFR calc Af Amer: >90 mL/min (ref >90)
GFR calc non Af Amer: >90 mL/min (ref >90)
Glucose, Bld: 112 mg/dL — ABNORMAL HIGH (ref 70–99)
Potassium: 4.3 mEq/L (ref 3.7–5.3)
SODIUM: 124 meq/L — AB (ref 137–147)

## 2014-05-04 LAB — CULTURE, RESPIRATORY W GRAM STAIN

## 2014-05-04 LAB — CULTURE, RESPIRATORY

## 2014-05-04 MED ORDER — HYDROMORPHONE HCL 2 MG PO TABS: 2.0000 mg | ORAL_TABLET | ORAL | Status: DC | PRN

## 2014-05-04 MED ORDER — LACTULOSE 10 GM/15ML PO SOLN: 20.0000 g | Freq: Three times a day (TID) | ORAL | Status: DC

## 2014-05-04 MED ORDER — SPIRONOLACTONE 100 MG PO TABS: 50.0000 mg | ORAL_TABLET | Freq: Every day | ORAL | Status: DC

## 2014-05-04 MED ORDER — FUROSEMIDE 40 MG PO TABS: 20.0000 mg | ORAL_TABLET | Freq: Every day | ORAL | Status: DC

## 2014-05-04 NOTE — Discharge Instructions (Signed)
Ascites °Ascites is a gathering of fluid in the belly (abdomen). This is most often caused by liver disease. It may also be caused by a number of other less common problems. It causes a ballooning out (distension) of the abdomen. °CAUSES  °Scarring of the liver (cirrhosis) is the most common cause of ascites. Other causes include: °· Infection or inflammation in the abdomen. °· Cancer in the abdomen. °· Heart failure. °· Certain forms of kidney failure (nephritic syndrome). °· Inflammation of the pancreas. °· Clots in the veins of the liver. °SYMPTOMS  °In the early stages of ascites, you may not have any symptoms. The main symptom of ascites is a sense of abdominal bloating. This is due to the presence of fluid. This may also cause an increase in abdominal or waist size. People with this condition can develop swelling in the legs, and men can develop a swollen scrotum. When there is a lot of fluid, it may be hard to breath. Stretching of the abdomen by fluid can be painful. °DIAGNOSIS  °Certain features of your medical history, such as a history of liver disease and of an enlarging abdomen, can suggest the presence of ascites. The diagnosis of ascites can be made on physical exam by your caregiver. An abdominal ultrasound examination can confirm that ascites is present, and estimate the amount of fluid. °Once ascites is confirmed, it is important to determine its cause. Again, a history of one of the conditions listed in "CAUSES" provides a strong clue. A physical exam is important, and blood and X-ray tests may be needed. During a procedure called paracentesis, a sample of fluid is removed from the abdomen. This can determine certain key features about the fluid, such as whether or not infection or cancer is present. Your caregiver will determine if a paracentesis is necessary. They will describe the procedure to you. °PREVENTION  °Ascites is a complication of other conditions. Therefore to prevent ascites, you  must seek treatment for any significant health conditions you have. Once ascites is present, careful attention to fluid and salt intake may help prevent it from getting worse. If you have ascites, you should not drink alcohol. °PROGNOSIS  °The prognosis of ascites depends on the underlying disease. If the disease is reversible, such as with certain infections or with heart failure, then ascites may improve or disappear. When ascites is caused by cirrhosis, then it indicates that the liver disease has worsened, and further evaluation and treatment of the liver disease is needed. If your ascites is caused by cancer, then the success or failure of the cancer treatment will determine whether your ascites will improve or worsen. °RISKS AND COMPLICATIONS  °Ascites is likely to worsen if it is not properly diagnosed and treated. A large amount of ascites can cause pain and difficulty breathing. The main complication, besides worsening, is infection (called spontaneous bacterial peritonitis). This requires prompt treatment. °TREATMENT  °The treatment of ascites depends on its cause. When liver disease is your cause, medical management using water pills (diuretics) and decreasing salt intake is often effective. Ascites due to peritoneal inflammation or malignancy (cancer) alone does not respond to salt restriction and diuretics. Hospitalization is sometimes required. °If the treatment of ascites cannot be managed with medications, a number of other treatments are available. Your caregivers will help you decide which will work best for you. Some of these are: °· Removal of fluid from the abdomen (paracentesis). °· Fluid from the abdomen is passed into a vein (peritoneovenous shunting). °·   Liver transplantation. °· Transjugular intrahepatic portosystemic stent shunt. °HOME CARE INSTRUCTIONS  °It is important to monitor body weight and the intake and output of fluids. Weigh yourself at the same time every day. Record your  weights. Fluid restriction may be necessary. It is also important to know your salt intake. The more salt you take in, the more fluid you will retain. Ninety percent of people with ascites respond to this approach. °· Follow any directions for medicines carefully. °· Follow up with your caregiver, as directed. °· Report any changes in your health, especially any new or worsening symptoms. °· If your ascites is from liver disease, avoid alcohol and other substances toxic to the liver. °SEEK MEDICAL CARE IF:  °· Your weight increases more than a few pounds in a few days. °· Your abdominal or waist size increases. °· You develop swelling in your legs. °· You had swelling and it worsens. °SEEK IMMEDIATE MEDICAL CARE IF:  °· You develop a fever. °· You develop new abdominal pain. °· You develop difficulty breathing. °· You develop confusion. °· You have bleeding from the mouth, stomach, or rectum. °MAKE SURE YOU:  °· Understand these instructions. °· Will watch your condition. °· Will get help right away if you are not doing well or get worse. °Document Released: 06/12/2005 Document Revised: 09/04/2011 Document Reviewed: 01/11/2007 °[employer]® Patient Information ©2015 [employer]. This information is not intended to replace advice given to you by your health care provider. Make sure you discuss any questions you have with your health care provider. ° °

## 2014-05-04 NOTE — Procedures (Signed)
US guided therapeutic paracentesis performed yielding 2.8 liters slightly turbid, yellow fluid. No immediate complications.

## 2014-05-04 NOTE — Plan of Care (Signed)
Problem: Phase I Progression Outcomes Goal: Pain controlled with appropriate interventions Outcome: Completed/Met Date Met:  05/04/14     

## 2014-05-04 NOTE — Discharge Summary (Signed)
Physician Discharge Summary  Bradley Woods UYQ:[id number] DOB: Jan 12, 1956 DOA: 04/30/2014  PCP: [name], MD  Admit date: 04/30/2014 Discharge date: 05/04/2014  Time spent: 35 minutes  Recommendations for Outpatient Follow-up:  1. Follow-up with PCP on 11/12. Please check repeat sodium level 2. Follow-up with hepatologist at [facility] as scheduled  Discharge Diagnoses:  Principal Problem: Healthcare associated pneumonia  Active Problems:   Hyponatremia   Decompensation of cirrhosis of liver   Hepatic cirrhosis due to chronic hepatitis C infection   Thrombocytopenia   Ascites   Coagulopathy   Severe protein-calorie malnutrition   Hypoalbuminemia   Normocytic anemia   Depression with anxiety   Hepatic encephalopathy   Hepatocellular carcinoma   Discharge Condition: fair  Diet recommendation: regular with fluid restriction to 1200 cc  Filed Weights   04/30/14 1230  Weight: 76.204 kg (168 lb)    History of present illness:  58 year old male with decompensated hep C cirrhosis on transplant list at [facility], recently diagnosed 3 cm lesion over right lobe of the liver consistent with [facility] (as per CT scan at [facility] in 03/22/2014) who was admitted to [facility]  During the last week of October for decompensated liver cirrhosis with encephalopathy. He underwent therapeutic paracentesis (2 L ascites fluid removed) and IR guided Bland particle embolization of the right liver lobe mass on 10/27. Patient had developed hematoma over the right groin following the procedure. He was discharged from there on 11/3 with plan on repeat ablation possibly in 6 weeks prior to being prepared for liver transplant. Patient was stable on the day of discharge but since 2 days prior to admission started having worsening abdominal distention with cough and dyspnea..chest x-ray on admission concerning for multifocal pneumonia versus asymmetric edema. Patient admitted with worsening ascites and  possible healthcare associated pneumonia.     Hospital Course:  Decompensated hep C cirrhosis patient presented with significant ascites and associated hyponatremia, thrombocytopenia and coagulopathy . Recently hospitalized to [facility] for therapeutic paracenteses. He now has a right liver lobe mass and underwent IR embolization on 10/27. Patient is being evaluated there for transplant. -therapeutic paracentesis with 5 L of clear ascites fluid removed on 11/6 and symptomatically improved. Negative for SBP. -transaminitis, coagulopathy and thrombocytopenia is chronic from his decompensated liver disease and unchanged. -continues to have chronic hyponatremia and Lasix and Aldactone dose adjusted. (See below) -patient having mild hepatic encephalopathy with infrequent bowel movements. I have increased his lactulose to 20 mg 3 times a day with titration to at least 2-3 loose bowel movements daily. Patient appears much improved today -patient underwent another therapeutic paracentesis today with 2.8 L of clear ascites fluid drained and feels much better clinically. -patient on as needed Dilaudid for abdominal pain and have given him a short prescription.. -Patient is stable to be discharged home with outpatient follow-up with his PCP and hepatologist at [facility]. I have discussed about his care plan with his PCP today. He will coordinate with [facility] ED for pt to have therapeutic paracentesis, possibly every once to twice weekly for now.    Healthcare associated pneumonia Was started on empiric vancomycin and Zosyn and transitioned to oral Levaquin. He has completed 5 days of antibiotic on the day of discharge.blood culture and sputum culture were negative. he does not have any respiratory symptoms at present  Hyponatremia Secondary to cirrhosis and now worsened. Patient's Lasix and Aldactone were held  and dose reduced to 20 mg and 50 mg respectively. Sodium level improved up  to 130 during this  hospitalization, and as soon as Lasix and Aldactone dose was increased a drop down to 123. The medications were held again and sodium level is 124 today. I will discharge him on current home dose of 20 and 50 mg of lasix and aldactone . Also instructed on fluid restriction to 1200 mL daily. Sodium needs to be monitored during outpatient follow-up.  Anemia of chronic disease Hemoglobin currently stable.  Anxiety and depression Continue Remeron. (Patient was on Celexa previously and switch to Remeron recently)  Protein calorie malnutrition, severe Patient encouraged on by mouth intake  DVT prophylaxis: SCD, high INR And low platelets  Code Status: full code  Family Communication: wife at bedside  Disposition Plan: home    Consultants:  IR  Procedures:  Therapeutic paracentesis on 11/6  Antibiotics:  IV Zosyn 11/5-11/7   oral levaquin 117-11/9   Discharge Exam: Filed Vitals:   05/04/14 1332  BP: 130/82  Pulse: 100  Temp: 98.2 F (36.8 C)  Resp: 18     General: Middle aged male in no acute distress, appears fatigued  HEENT:pallor present,icteric, moist oral mucosa  Chest: Clear to auscultation bilaterally  CVS: Normal S1 and S2  Abdomen: abdomen distended (improved after paracentesis today), nontender, bowel sounds present  extremities: Trace edema  CNS: more alert and oriented today, , no tremors  Discharge Instructions You were cared for by a hospitalist during your hospital stay. If you have any questions about your discharge medications or the care you received while you were in the hospital after you are discharged, you can call the unit and asked to speak with the hospitalist on call if the hospitalist that took care of you is not available. Once you are discharged, your primary care physician will handle any further medical issues. Please note that NO REFILLS for any discharge medications will be authorized once you are discharged, as it is  imperative that you return to your primary care physician (or establish a relationship with a primary care physician if you do not have one) for your aftercare needs so that they can reassess your need for medications and monitor your lab values.   Current Discharge Medication List    CONTINUE these medications which have CHANGED   Details  furosemide (LASIX) 40 MG tablet Take 0.5 tablets (20 mg total) by mouth daily. Qty: 30 tablet, Refills: 0    HYDROmorphone (DILAUDID) 2 MG tablet Take 1 tablet (2 mg total) by mouth every 4 (four) hours as needed for severe pain. Qty: 30 tablet, Refills: 0    lactulose (CHRONULAC) 10 GM/15ML solution Take 30 mLs (20 g total) by mouth 3 (three) times daily. Please titrate to at least 2-3 lose stools daily Qty: 240 mL, Refills: 0    spironolactone (ALDACTONE) 100 MG tablet Take 0.5 tablets (50 mg total) by mouth daily. Qty: 30 tablet, Refills: 0      CONTINUE these medications which have NOT CHANGED   Details  mirtazapine (REMERON) 7.5 MG tablet Take 7.5 mg by mouth.       No Known Allergies Follow-up Information    Follow up with [name], MD On 05/07/2014.   Specialty:  Internal Medicine   Contact information:   [place] [address] [phone]       Follow up with Charlean Sanfilippo, MD.   Specialty:  Internal Medicine   Why:  as scheduled   Contact information:   [address]  [place] [address] [phone]        The results of significant diagnostics from this hospitalization (including imaging, microbiology, ancillary and laboratory) are listed below for reference.    Significant Diagnostic Studies: Dg Chest 2 View  04/30/2014   CLINICAL DATA:  Liver failure. Now with shortness of breath and distended abdomen  EXAM: CHEST  2 VIEW  COMPARISON:  8/28/9  FINDINGS: Normal heart size. There is diffuse airspace opacification throughout the right lung. This is new from previous exam. Left lung is  clear. No pleural effusions identified. 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  IMPRESSION: 1. Asymmetric opacification of the right lung, new from previous exam. This may represent multifocal pneumonia or asymmetric edema.   Electronically Signed   By: Kerby Moors M.D.   On: 04/30/2014 11:46   US Paracentesis  05/01/2014   INDICATION: Cirrhosis, hepatitis-C, recurrent ascites. Request is made for diagnostic and therapeutic paracentesis up to 5 liters.  EXAM: ULTRASOUND-GUIDED DIAGNOSTIC AND THERAPEUTIC PARACENTESIS  COMPARISON:  PRIOR PARACENTESIS ON 01/19/2014  MEDICATIONS: None.  COMPLICATIONS: None immediate  TECHNIQUE: Informed written consent was obtained from the patient after a discussion of the risks, benefits and alternatives to treatment. A timeout was performed prior to the initiation of the procedure.  Initial ultrasound scanning demonstrates a large amount of ascites within the right lower abdominal quadrant. The right lower abdomen was prepped and draped in the usual sterile fashion. 1% lidocaine was used for local anesthesia. Under direct ultrasound guidance, a 19 gauge, 7-cm, Yueh catheter was introduced. An ultrasound image was saved for documentation purposed. The paracentesis was performed. The catheter was removed and a dressing was applied. The patient tolerated the procedure well without immediate post procedural complication.  FINDINGS: A total of approximately 5 liters liters of slightly turbid, yellow fluid was removed. Samples were sent to the laboratory as requested by the clinical team.  IMPRESSION: Successful ultrasound-guided diagnostic and therapeutic paracentesis yielding 5 liters of peritoneal fluid.  Read by: Rowe Robert, PA-C   Electronically Signed   By: Aletta Edouard M.D.   On: 05/01/2014 14:34    Microbiology: Recent Results (from the past 240 hour(s))  Culture, blood (routine x 2)     Status: None (Preliminary result)   Collection Time:  04/30/14 12:56 PM  Result Value Ref Range Status   Specimen Description BLOOD LEFT ARM  Final   Special Requests BOTTLES DRAWN AEROBIC AND ANAEROBIC 5 CC EACH  Final   Culture  Setup Time   Final    04/30/2014 17:08 Performed at [facility]    Culture   Final           BLOOD CULTURE RECEIVED NO GROWTH TO DATE CULTURE WILL BE HELD FOR 5 DAYS BEFORE ISSUING A FINAL NEGATIVE REPORT Performed at [facility]    Report Status PENDING  Incomplete  Culture, blood (routine x 2)     Status: None (Preliminary result)   Collection Time: 04/30/14 12:57 PM  Result Value Ref Range Status   Specimen Description BLOOD RIGHT HAND  Final   Special Requests BOTTLES DRAWN AEROBIC AND ANAEROBIC 5 CC  Final   Culture  Setup Time   Final    04/30/2014 17:08 Performed at [facility]    Culture   Final           BLOOD CULTURE RECEIVED NO GROWTH TO DATE CULTURE WILL BE HELD FOR 5 DAYS BEFORE ISSUING A FINAL NEGATIVE REPORT Performed  at [facility]    Report Status PENDING  Incomplete  Body fluid culture     Status: None (Preliminary result)   Collection Time: 05/01/14  2:24 PM  Result Value Ref Range Status   Specimen Description PERITONEAL CAVITY  Final   Special Requests Normal  Final   Gram Stain   Final    NO WBC SEEN NO ORGANISMS SEEN Performed at [facility]    Culture   Final    NO GROWTH 2 DAYS Performed at [facility]    Report Status PENDING  Incomplete  Culture, sputum-assessment     Status: None   Collection Time: 05/02/14 10:43 AM  Result Value Ref Range Status   Specimen Description SPUTUM  Final   Special Requests Normal  Final   Sputum evaluation   Final    THIS SPECIMEN IS ACCEPTABLE. RESPIRATORY CULTURE REPORT TO FOLLOW.   Report Status 05/02/2014 FINAL  Final  Culture, respiratory (NON-Expectorated)     Status: None   Collection Time: 05/02/14 10:43 AM  Result Value Ref Range Status   Specimen Description SPUTUM  Final    Special Requests NONE  Final   Gram Stain   Final    RARE WBC PRESENT, PREDOMINANTLY PMN FEW SQUAMOUS EPITHELIAL CELLS PRESENT FEW YEAST WITH PSEUDOHYPHAE Performed at [facility]    Culture   Final    MODERATE CANDIDA ALBICANS Performed at [facility]    Report Status 05/04/2014 FINAL  Final     Labs: Basic Metabolic Panel:  Recent Labs Lab 04/30/14 1112 05/02/14 0518 05/02/14 1516 05/03/14 0947 05/04/14 0449  NA 127* 131* 128* 123* 124*  K 4.2 4.6 4.3 4.2 4.3  CL 93* 98 94* 90* 87*  CO2 24 25 27 24 26   GLUCOSE 97 90 154* 137* 112*  BUN 10 9 9 8 7   CREATININE 0.74 0.82 0.88 0.71 0.67  CALCIUM 7.9* 7.3* 7.3* 7.4* 7.8*   Liver Function Tests:  Recent Labs Lab 04/30/14 1112 05/02/14 0518 05/02/14 1516  AST 64* 66* 60*  ALT 47 37 38  ALKPHOS 105 103 109  BILITOT 4.8* 4.1* 3.8*  PROT 5.5* 4.8* 5.1*  ALBUMIN 1.9* 1.6* 1.5*    Recent Labs Lab 04/30/14 1112  LIPASE 42    Recent Labs Lab 04/30/14 1112  AMMONIA 14   CBC:  Recent Labs Lab 04/30/14 1112 05/02/14 0518 05/03/14 0544  WBC 10.8* 9.3 15.0*  NEUTROABS 8.7*  --   --   HGB 9.3* 8.0* 9.6*  HCT 28.9* 25.5* 29.5*  MCV 86.3 87.3 86.8  PLT 68* 57* 68*   Cardiac Enzymes: No results for input(s): CKTOTAL, CKMB, CKMBINDEX, TROPONINI in the last 168 hours. BNP: BNP (last 3 results) No results for input(s): PROBNP in the last 8760 hours. CBG: No results for input(s): GLUCAP in the last 168 hours.     SignedLouellen Molder  [facility] 05/04/2014, 1:46 PM

## 2014-05-04 NOTE — Telephone Encounter (Signed)
error:[id number] ° °

## 2014-05-04 NOTE — Progress Notes (Signed)
Patient Discharge:  Disposition: Pt calm, cooperative, and stated he was ready to go home.  Education: Discharge instructions and education reviewed with pt and pt's wife. All questions answered.   IV: IV DC'd  Telemetry: N/A   Follow-up appointments: Follow up appointments reviewed with pt.   Prescriptions: Prescriptions in pt possession.   Transportation: Pt escorted by nursing tech by wheelchair downstairs to home with wife  Belongings: Belongings in pt possession

## 2014-05-05 ENCOUNTER — Ambulatory Visit: Payer: Medicare Other | Admitting: Internal Medicine

## 2014-05-05 LAB — BODY FLUID CULTURE
CULTURE: NO GROWTH
Gram Stain: NONE SEEN
Special Requests: NORMAL

## 2014-05-05 LAB — LEGIONELLA ANTIGEN, URINE

## 2014-05-06 LAB — CULTURE, BLOOD (ROUTINE X 2)
CULTURE: NO GROWTH
Culture: NO GROWTH

## 2014-05-07 ENCOUNTER — Inpatient Hospital Stay (HOSPITAL_COMMUNITY): Payer: Medicare Other

## 2014-05-07 ENCOUNTER — Encounter (HOSPITAL_COMMUNITY): Payer: Self-pay | Admitting: Family Medicine

## 2014-05-07 ENCOUNTER — Inpatient Hospital Stay (HOSPITAL_COMMUNITY)
Admission: EM | Admit: 2014-05-07 | Discharge: 2014-05-13 | DRG: 435 | Disposition: A | Discharge status: Home / Self Care | Payer: Medicare Other | Attending: Internal Medicine | Admitting: Internal Medicine

## 2014-05-07 ENCOUNTER — Emergency Department (HOSPITAL_COMMUNITY): Payer: Medicare Other

## 2014-05-07 ENCOUNTER — Ambulatory Visit: Payer: Medicare Other | Admitting: Internal Medicine

## 2014-05-07 DIAGNOSIS — D638 Anemia in other chronic diseases classified elsewhere: Secondary | ICD-10-CM | POA: Diagnosis present

## 2014-05-07 DIAGNOSIS — K7460 Unspecified cirrhosis of liver: Secondary | ICD-10-CM | POA: Diagnosis present

## 2014-05-07 DIAGNOSIS — R627 Adult failure to thrive: Secondary | ICD-10-CM | POA: Diagnosis present

## 2014-05-07 DIAGNOSIS — K5190 Ulcerative colitis, unspecified, without complications: Secondary | ICD-10-CM | POA: Diagnosis present

## 2014-05-07 DIAGNOSIS — Z7682 Awaiting organ transplant status: Secondary | ICD-10-CM | POA: Diagnosis not present

## 2014-05-07 DIAGNOSIS — E871 Hypo-osmolality and hyponatremia: Secondary | ICD-10-CM | POA: Diagnosis present

## 2014-05-07 DIAGNOSIS — M7989 Other specified soft tissue disorders: Secondary | ICD-10-CM | POA: Diagnosis present

## 2014-05-07 DIAGNOSIS — R188 Other ascites: Secondary | ICD-10-CM | POA: Diagnosis present

## 2014-05-07 DIAGNOSIS — E43 Unspecified severe protein-calorie malnutrition: Secondary | ICD-10-CM | POA: Diagnosis present

## 2014-05-07 DIAGNOSIS — Z79899 Other long term (current) drug therapy: Secondary | ICD-10-CM

## 2014-05-07 DIAGNOSIS — D759 Disease of blood and blood-forming organs, unspecified: Secondary | ICD-10-CM | POA: Diagnosis present

## 2014-05-07 DIAGNOSIS — Z8619 Personal history of other infectious and parasitic diseases: Secondary | ICD-10-CM | POA: Diagnosis not present

## 2014-05-07 DIAGNOSIS — D689 Coagulation defect, unspecified: Secondary | ICD-10-CM | POA: Diagnosis present

## 2014-05-07 DIAGNOSIS — D72829 Elevated white blood cell count, unspecified: Secondary | ICD-10-CM | POA: Diagnosis present

## 2014-05-07 DIAGNOSIS — D696 Thrombocytopenia, unspecified: Secondary | ICD-10-CM | POA: Diagnosis present

## 2014-05-07 DIAGNOSIS — R140 Abdominal distension (gaseous): Secondary | ICD-10-CM

## 2014-05-07 DIAGNOSIS — K7031 Alcoholic cirrhosis of liver with ascites: Secondary | ICD-10-CM | POA: Diagnosis present

## 2014-05-07 DIAGNOSIS — F1020 Alcohol dependence, uncomplicated: Secondary | ICD-10-CM | POA: Diagnosis present

## 2014-05-07 DIAGNOSIS — D62 Acute posthemorrhagic anemia: Secondary | ICD-10-CM | POA: Diagnosis present

## 2014-05-07 DIAGNOSIS — C220 Liver cell carcinoma: Secondary | ICD-10-CM | POA: Diagnosis present

## 2014-05-07 DIAGNOSIS — K922 Gastrointestinal hemorrhage, unspecified: Secondary | ICD-10-CM | POA: Diagnosis present

## 2014-05-07 DIAGNOSIS — I10 Essential (primary) hypertension: Secondary | ICD-10-CM | POA: Diagnosis present

## 2014-05-07 DIAGNOSIS — G4730 Sleep apnea, unspecified: Secondary | ICD-10-CM | POA: Diagnosis present

## 2014-05-07 DIAGNOSIS — R531 Weakness: Secondary | ICD-10-CM | POA: Diagnosis present

## 2014-05-07 DIAGNOSIS — R0602 Shortness of breath: Secondary | ICD-10-CM

## 2014-05-07 DIAGNOSIS — E876 Hypokalemia: Secondary | ICD-10-CM | POA: Diagnosis present

## 2014-05-07 DIAGNOSIS — F418 Other specified anxiety disorders: Secondary | ICD-10-CM | POA: Diagnosis present

## 2014-05-07 DIAGNOSIS — K7290 Hepatic failure, unspecified without coma: Secondary | ICD-10-CM | POA: Diagnosis present

## 2014-05-07 DIAGNOSIS — B182 Chronic viral hepatitis C: Secondary | ICD-10-CM | POA: Diagnosis present

## 2014-05-07 LAB — COMPREHENSIVE METABOLIC PANEL
ALK PHOS: 112 U/L (ref 39–117)
ALT: 32 U/L (ref 0–53)
AST: 64 U/L — AB (ref 0–37)
Albumin: 1.9 g/dL — ABNORMAL LOW (ref 3.5–5.2)
Anion gap: 10 (ref 5–15)
BILIRUBIN TOTAL: 5.3 mg/dL — AB (ref 0.3–1.2)
BUN: 12 mg/dL (ref 6–23)
CHLORIDE: 89 meq/L — AB (ref 96–112)
CO2: 26 mEq/L (ref 19–32)
Calcium: 7.9 mg/dL — ABNORMAL LOW (ref 8.4–10.5)
Creatinine, Ser: 0.74 mg/dL (ref 0.50–1.35)
GFR calc Af Amer: >90 mL/min (ref >90)
GFR calc non Af Amer: >90 mL/min (ref >90)
Glucose, Bld: 91 mg/dL (ref 70–99)
POTASSIUM: 3.3 meq/L — AB (ref 3.7–5.3)
Sodium: 125 mEq/L — ABNORMAL LOW (ref 137–147)
Total Protein: 6.1 g/dL (ref 6.0–8.3)

## 2014-05-07 LAB — CBC WITH DIFFERENTIAL/PLATELET
Basophils Absolute: 0 10*3/uL (ref 0.0–0.1)
Basophils Relative: 0 % (ref 0–1)
EOS PCT: 4 % (ref 0–5)
Eosinophils Absolute: 0.6 10*3/uL (ref 0.0–0.7)
HEMATOCRIT: 30.2 % — AB (ref 39.0–52.0)
Hemoglobin: 9.8 g/dL — ABNORMAL LOW (ref 13.0–17.0)
LYMPHS PCT: 13 % (ref 12–46)
Lymphs Abs: 2 10*3/uL (ref 0.7–4.0)
MCH: 28.1 pg (ref 26.0–34.0)
MCHC: 32.5 g/dL (ref 30.0–36.0)
MCV: 86.5 fL (ref 78.0–100.0)
MONOS PCT: 11 % (ref 3–12)
Monocytes Absolute: 1.7 10*3/uL — ABNORMAL HIGH (ref 0.1–1.0)
NEUTROS ABS: 11 10*3/uL — AB (ref 1.7–7.7)
Neutrophils Relative %: 72 % (ref 43–77)
PLATELETS: 84 10*3/uL — AB (ref 150–400)
RBC: 3.49 MIL/uL — AB (ref 4.22–5.81)
RDW: 21.4 % — AB (ref 11.5–15.5)
WBC: 15.3 10*3/uL — AB (ref 4.0–10.5)

## 2014-05-07 LAB — MAGNESIUM: Magnesium: 1.9 mg/dL (ref 1.5–2.5)

## 2014-05-07 LAB — LIPASE, BLOOD: Lipase: 105 U/L — ABNORMAL HIGH (ref 11–59)

## 2014-05-07 LAB — PROTIME-INR
INR: 2.35 — AB (ref 0.00–1.49)
Prothrombin Time: 25.9 seconds — ABNORMAL HIGH (ref 11.6–15.2)

## 2014-05-07 LAB — APTT: APTT: 43 s — AB (ref 24–37)

## 2014-05-07 LAB — GLUCOSE, CAPILLARY: Glucose-Capillary: 70 mg/dL (ref 70–99)

## 2014-05-07 LAB — I-STAT CG4 LACTIC ACID, ED: Lactic Acid, Venous: 2.5 mmol/L — ABNORMAL HIGH (ref 0.5–2.2)

## 2014-05-07 LAB — PHOSPHORUS: PHOSPHORUS: 2.2 mg/dL — AB (ref 2.3–4.6)

## 2014-05-07 MED ORDER — ONDANSETRON HCL 4 MG PO TABS: 4.0000 mg | ORAL_TABLET | Freq: Four times a day (QID) | ORAL | Status: DC | PRN

## 2014-05-07 MED ORDER — LACTULOSE 10 GM/15ML PO SOLN
20.0000 g | Freq: Three times a day (TID) | ORAL | Status: DC
  Administered 2014-05-07 – 2014-05-12 (×16): 20 g via ORAL
  Filled 2014-05-07 (×20): qty 30

## 2014-05-07 MED ORDER — SODIUM CHLORIDE 0.9 % IV SOLN: INTRAVENOUS | Status: DC

## 2014-05-07 MED ORDER — SPIRONOLACTONE 50 MG PO TABS
50.0000 mg | ORAL_TABLET | Freq: Every day | ORAL | Status: DC
  Administered 2014-05-07 – 2014-05-08 (×2): 50 mg via ORAL
  Filled 2014-05-07 (×2): qty 1

## 2014-05-07 MED ORDER — CETYLPYRIDINIUM CHLORIDE 0.05 % MT LIQD
7.0000 mL | Freq: Two times a day (BID) | OROMUCOSAL | Status: DC
  Administered 2014-05-07 – 2014-05-13 (×8): 7 mL via OROMUCOSAL

## 2014-05-07 MED ORDER — ONDANSETRON HCL 4 MG/2ML IJ SOLN: 4.0000 mg | Freq: Four times a day (QID) | INTRAMUSCULAR | Status: DC | PRN

## 2014-05-07 MED ORDER — SODIUM CHLORIDE 0.9 % IJ SOLN: 3.0000 mL | INTRAMUSCULAR | Status: DC | PRN

## 2014-05-07 MED ORDER — HYDROMORPHONE HCL 2 MG PO TABS
2.0000 mg | ORAL_TABLET | ORAL | Status: DC | PRN
  Administered 2014-05-08 – 2014-05-09 (×5): 2 mg via ORAL
  Filled 2014-05-07 (×5): qty 1

## 2014-05-07 MED ORDER — SODIUM CHLORIDE 0.9 % IJ SOLN
3.0000 mL | Freq: Two times a day (BID) | INTRAMUSCULAR | Status: DC
  Administered 2014-05-08 – 2014-05-11 (×7): 3 mL via INTRAVENOUS

## 2014-05-07 MED ORDER — MIRTAZAPINE 7.5 MG PO TABS
7.5000 mg | ORAL_TABLET | Freq: Every day | ORAL | Status: DC
Start: 2014-05-07 — End: 2014-05-13
  Administered 2014-05-07 – 2014-05-11 (×4): 7.5 mg via ORAL
  Filled 2014-05-07 (×8): qty 1

## 2014-05-07 MED ORDER — SODIUM CHLORIDE 0.9 % IJ SOLN
3.0000 mL | Freq: Two times a day (BID) | INTRAMUSCULAR | Status: DC
  Administered 2014-05-07 – 2014-05-12 (×8): 3 mL via INTRAVENOUS

## 2014-05-07 MED ORDER — SODIUM CHLORIDE 0.9 % IV SOLN: 250.0000 mL | INTRAVENOUS | Status: DC | PRN

## 2014-05-07 MED ORDER — FUROSEMIDE 20 MG PO TABS
20.0000 mg | ORAL_TABLET | Freq: Every day | ORAL | Status: DC
Start: 2014-05-07 — End: 2014-05-08
  Administered 2014-05-07 – 2014-05-08 (×2): 20 mg via ORAL
  Filled 2014-05-07 (×2): qty 1

## 2014-05-07 NOTE — Procedures (Signed)
Successful US guided paracentesis from RLQ.  Yielded 3.8L of clear yellow fluid.  No immediate complications.  Pt tolerated well.   Specimen was not sent for labs.  Ascencion Dike PA-C 05/07/2014 10:57 AM

## 2014-05-07 NOTE — ED Provider Notes (Signed)
CSN: [id number]     Arrival date & time 05/07/14  0425 History   First MD Initiated Contact with Patient 05/07/14 [phone]     Chief Complaint  Patient presents with  . Shortness of Breath    Increased since last visit of 2 weeks ago.   . Weakness    Generalized weakness.      (Consider location/radiation/quality/duration/timing/severity/associated sxs/prior Treatment) HPI  Bradley Woods is a 58 y.o. male with past medical history of hepatitis C, alcohol abuse, cirrhosis, hypertension presenting with weakness and shortness of breath. Patient was recently admitted for pneumonia and discharged 2 weeks ago. He completed an antibiotic course while in the hospital. He states his shortness of breath has persisted and gotten worse since discharge.  He is currently on the [facility] list at [facility].  He admits to worsening abdominal distension as well.  He has no chest pain or shortness of breath.  Patient states he is too weak to perform ADLs.  He has no further complaints.  10 Systems reviewed and are negative for acute change except as noted in the HPI.     Past Medical History  Diagnosis Date  . Hepatitis C   . Anxiety     dx w/ depression 07/2008  . Colitis   . Alcohol abuse, in remission   . Headache(784.0)   . Allergic rhinitis   . Hepatic failure due to alcoholism   . Ulcerative colitis   . Coagulopathy 01/21/2014  . Severe protein-calorie malnutrition 01/21/2014  . Depression with anxiety 04/30/2014  . Ascites 01/21/2014  . ALLERGIC RHINITIS 04/28/2008    Qualifier: Diagnosis of  By: Larose Kells MD, [place] 04/04/2007    Annotation: cscope 08-2005 (had diarrhea) Qualifier: Diagnosis of  By: Larose Kells MD, [place] HYPERSOMNIA, ASSOCIATED WITH SLEEP APNEA 09/20/2009    Qualifier: Diagnosis of  By: Elsworth Soho MD, [place], HX OF 06/15/2009    Qualifier: Diagnosis of  By: Inda Castle FNP, [place]   . HTN (hypertension) 09/26/2010  . Liver failure  01/15/2014  . Hepatic cirrhosis due to chronic hepatitis C infection 01/15/2014  . Anemia 01/19/2014  . Thrombocytopenia 01/19/2014  . Encephalopathy, hepatic 01/21/2014  . Hyponatremia 04/30/2014  . Hypoalbuminemia 04/30/2014   Past Surgical History  Procedure Laterality Date  . Tonsillectomy    . Paracentesis     Family History  Problem Relation Age of Onset  . Diabetes      GM  . Coronary artery disease    . Hyperlipidemia    . Prostate cancer    . Colon cancer Neg Hx    History  Substance Use Topics  . Smoking status: Never Smoker   . Smokeless tobacco: Not on file  . Alcohol Use: No     Comment: Quit in 1988.    Review of Systems    Allergies  Mango flavor  Home Medications   Prior to Admission medications   Medication Sig Start Date End Date Taking? Authorizing Provider  furosemide (LASIX) 40 MG tablet Take 0.5 tablets (20 mg total) by mouth daily. 05/04/14  Yes Nishant Dhungel, MD  HYDROmorphone (DILAUDID) 2 MG tablet Take 1 tablet (2 mg total) by mouth every 4 (four) hours as needed for severe pain. 05/04/14  Yes Nishant Dhungel, MD  lactulose (CHRONULAC) 10 GM/15ML solution Take 30 mLs (20 g total) by mouth 3 (three) times daily. Please titrate to at least 2-3  lose stools daily 05/04/14  Yes Nishant Dhungel, MD  mirtazapine (REMERON) 7.5 MG tablet Take 7.5 mg by mouth at bedtime.    Yes Historical Provider, MD  spironolactone (ALDACTONE) 100 MG tablet Take 0.5 tablets (50 mg total) by mouth daily. 05/04/14  Yes Nishant Dhungel, MD   BP 114/75 mmHg  Pulse 100  Temp(Src) 97.5 F (36.4 C) (Oral)  Resp 24  Ht 5\' 9"  (1.753 m)  Wt 159 lb (72.122 kg)  BMI 23.47 kg/m2  SpO2 98% Physical Exam  Constitutional: He is oriented to person, place, and time. Vital signs are normal. He appears well-developed and well-nourished.  Non-toxic appearance. He does not appear ill. No distress.  HENT:  Head: Normocephalic and atraumatic.  Nose: Nose normal.  Mouth/Throat: Oropharynx  is clear and moist. No oropharyngeal exudate.  Oral jaundice SEEN.  Eyes: Conjunctivae and EOM are normal. Pupils are equal, round, and reactive to light. Scleral icterus is present.  Neck: Normal range of motion. Neck supple. No tracheal deviation, no edema, no erythema and normal range of motion present. No thyroid mass and no thyromegaly present.  Cardiovascular: Regular rhythm, S1 normal, S2 normal, normal heart sounds, intact distal pulses and normal pulses.  Exam reveals no gallop and no friction rub.   No murmur heard. Pulses:      Radial pulses are 2+ on the right side, and 2+ on the left side.       Dorsalis pedis pulses are 2+ on the right side, and 2+ on the left side.  tachycardic  Pulmonary/Chest: Effort normal and breath sounds normal. No respiratory distress. He has no wheezes. He has no rhonchi. He has no rales.  Abdominal: Soft. Normal appearance and bowel sounds are normal. He exhibits distension. He exhibits no ascites and no mass. There is no hepatosplenomegaly. There is no tenderness. There is no rebound, no guarding and no CVA tenderness.  ascites  Musculoskeletal: Normal range of motion. He exhibits no edema or tenderness.  Lymphadenopathy:    He has no cervical adenopathy.  Neurological: He is alert and oriented to person, place, and time. He has normal strength. No cranial nerve deficit or sensory deficit. He exhibits normal muscle tone. GCS eye subscore is 4. GCS verbal subscore is 5. GCS motor subscore is 6.  Skin: Skin is warm, dry and intact. No petechiae and no rash noted. He is not diaphoretic. No erythema. No pallor.  Psychiatric: He has a normal mood and affect. His behavior is normal. Judgment normal.  Nursing note and vitals reviewed.   ED Course  Procedures (including critical care time) Labs Review Labs Reviewed  CBC WITH DIFFERENTIAL - Abnormal; Notable for the following:    WBC 15.3 (*)    RBC 3.49 (*)    Hemoglobin 9.8 (*)    HCT 30.2 (*)    RDW  21.4 (*)    Platelets 84 (*)    Neutro Abs 11.0 (*)    Monocytes Absolute 1.7 (*)    All other components within normal limits  COMPREHENSIVE METABOLIC PANEL - Abnormal; Notable for the following:    Sodium 125 (*)    Potassium 3.3 (*)    Chloride 89 (*)    Calcium 7.9 (*)    Albumin 1.9 (*)    AST 64 (*)    Total Bilirubin 5.3 (*)    All other components within normal limits  LIPASE, BLOOD - Abnormal; Notable for the following:    Lipase 105 (*)  All other components within normal limits  PROTIME-INR - Abnormal; Notable for the following:    Prothrombin Time 25.9 (*)    INR 2.35 (*)    All other components within normal limits  I-STAT CG4 LACTIC ACID, ED - Abnormal; Notable for the following:    Lactic Acid, Venous 2.50 (*)    All other components within normal limits    Imaging Review Dg Chest 2 View  05/07/2014   CLINICAL DATA:  Cough, shortness of breath, ascites. History of liver failure.  EXAM: CHEST  2 VIEW  COMPARISON:  PA and lateral chest 04/30/2014.  FINDINGS: Airspace disease throughout the right chest seen on the prior study has markedly improved. The left lung appears clear. Heart size is normal. No pneumothorax or pleural effusion. Marked degenerative change right shoulder is noted.  IMPRESSION: Marked improvement in airspace disease in the right chest consistent with resolving pneumonia. No new abnormality.   Electronically Signed   By: Inge Rise M.D.   On: 05/07/2014 05:31     EKG Interpretation   Date/Time:  Thursday May 07 2014 04:41:58 EST Ventricular Rate:  113 PR Interval:  132 QRS Duration: 71 QT Interval:  346 QTC Calculation: 474 R Axis:   15 Text Interpretation:  Sinus tachycardia Borderline low voltage, extremity  leads Confirmed by Glynn Octave [phone]) on 05/07/2014 4:59:34 AM      MDM   Final diagnoses:  SOB (shortness of breath)  Weakness generalized    Patient since emergency department for weakness, shortness  of breath, worsening abdominal distention in the setting of hep C cirrhosis and alcoholic cirrhosis. He is in the [facility] transplant list.laboratory studies did not reveal significant changes from baseline, however he does have abnormalities in his INR, total bilirubin. Of note patient's creatinine is normal. White blood cell count elevated at 15.3. Infection cannot be entirely excluded, I anticipate obtaining studies with therapeutic tap. Patient will be admitted to try hospitalist, telemetry unit.    Everlene Balls, MD 05/07/14 [phone]

## 2014-05-07 NOTE — Progress Notes (Signed)
Pt had a small, very light red colored loose stool in toilet. Will cont to monitor.

## 2014-05-07 NOTE — H&P (Signed)
[facility] History and Physical  Monti Jilek IHK:[id number] DOB: [birth year] DOA: 05/07/2014  Referring physician: ER physician PCP: [name], MD   Chief Complaint: abdominal distention and leg swelling  HPI:  58 year old male with past medical history ofdecompensated hep C cirrhosis on transplant list at [facility], recently diagnosed 3 cm lesion over right lobe of the liver consistent with [facility] (as per CT scan at [place] in 03/22/2014), recent hospitalization and discharge 05/04/2014 for decompensated liver cirrhosis, underwent paracentesis prior to discharge with 2.8 L fluid removed who presented back to [facility] ED 05/07/2014 with worsening swelling and abdominal distention. Patient reported he started to feel weak one day prior to this admission with difficulty ambulation. He has no complaints of abdominal pain or nausea or vomiting. No complaints of chest pain or shortness of breath. No fevers or chills. No reports of diarrhea or constipation. No reports of blood in the stool or urine. In ED, blood pressure is 114/75, heart rate 100, T max 98.6 F and oxygen saturation 98% on room air. Blood work revealed white blood cell count of 15.2, hemoglobin 9.8, platelets 84, sodium 125, potassium 3.3 and INR of 2.35. Chest x-ray showed marked improvement in airspace disease in the right chest consistent with resolving pneumonia and no new abnormality.  Assessment & Plan    Hospital Course:  Abdominal distention / abdominal ascites / Decompensated hep C cirrhosis - most recent paracentesis done 05/04/2014 with 2.8 L fluid removed. - order placed for paracentesis on this admission. - May resume home medications which include Lasix 20 mg daily, lactulose 3 times daily, spironolactone 50 mg daily.  Hyponatremia - Secondary to progressive liver cirrhosis - since previous admission, Lasix and spironolactone were reduced because of ongoing hyponatremia. - continue to monitor BMP. - Fluid  restriction to 1.2 L maximum daily.  Hypokalemia - Secondary to diuretics - Continue to supplement.  Leukocytosis - no evidence of infection. No fevers. Defer treatment with antibiotics for now.  Anemia of chronic disease - likely secondary to bone marrow suppression from liver cirrhosis - Hemoglobin stable  Thrombocytopenia - Secondary to bone marrow suppression from liver cirrhosis and possibly new finding of hepatocellular carcinoma - Continue to monitor platelet count. He use SCDs for DVT prophylaxis. - No signs of bleeding.  Anxiety and depression - Continue Remeron.  Protein calorie malnutrition, severe - Patient encouraged on by mouth intake   DVT prophylaxis:  - SCD's bilaterally due to thrombocytopenia  Radiological Exams on Admission: Dg Chest 2 View 05/07/2014 IMPRESSION: Marked improvement in airspace disease in the right chest consistent with resolving pneumonia. No new abnormality.     Code Status: Full Family Communication: Plan of care discussed with the patient  Disposition Plan: Admit for further evaluation  Leisa Lenz, MD  [facility] Pager [phone]  Review of Systems:  Constitutional: Negative for fever, chills and malaise/fatigue. Negative for diaphoresis.  HENT: Negative for hearing loss, ear pain, nosebleeds, congestion, sore throat, neck pain, tinnitus and ear discharge.   Eyes: Negative for blurred vision, double vision, photophobia, pain, discharge and redness.  Respiratory: Negative for cough, hemoptysis, sputum production, shortness of breath, wheezing and stridor.   Cardiovascular: Negative for chest pain, palpitations, orthopnea, claudication.  Gastrointestinal: Negative for nausea, vomiting and abdominal pain. Negative for heartburn, constipation, blood in stool and melena.  Genitourinary: Negative for dysuria, urgency, frequency, hematuria and flank pain.  Musculoskeletal: Negative for myalgias, back pain, joint pain and falls.   Skin: Negative for itching and rash.  Neurological: Negative for dizziness and weakness. Negative for tingling, tremors, sensory change, speech change, focal weakness, loss of consciousness and headaches.  Endo/Heme/Allergies: Negative for environmental allergies and polydipsia. Does not bruise/bleed easily.  Psychiatric/Behavioral: Negative for suicidal ideas. The patient is not nervous/anxious.      Past Medical History  Diagnosis Date  . Hepatitis C   . Anxiety     dx w/ depression 07/2008  . Colitis   . Alcohol abuse, in remission   . Headache(784.0)   . Allergic rhinitis   . Hepatic failure due to alcoholism   . Ulcerative colitis   . Coagulopathy 01/21/2014  . Severe protein-calorie malnutrition 01/21/2014  . Depression with anxiety 04/30/2014  . Ascites 01/21/2014  . ALLERGIC RHINITIS 04/28/2008    Qualifier: Diagnosis of  By: Larose Kells MD, [place] 04/04/2007    Annotation: cscope 08-2005 (had diarrhea) Qualifier: Diagnosis of  By: Larose Kells MD, [place] HYPERSOMNIA, ASSOCIATED WITH SLEEP APNEA 09/20/2009    Qualifier: Diagnosis of  By: Elsworth Soho MD, [place], HX OF 06/15/2009    Qualifier: Diagnosis of  By: Inda Castle FNP, [place]   . HTN (hypertension) 09/26/2010  . Liver failure 01/15/2014  . Hepatic cirrhosis due to chronic hepatitis C infection 01/15/2014  . Anemia 01/19/2014  . Thrombocytopenia 01/19/2014  . Encephalopathy, hepatic 01/21/2014  . Hyponatremia 04/30/2014  . Hypoalbuminemia 04/30/2014   Past Surgical History  Procedure Laterality Date  . Tonsillectomy    . Paracentesis     Social History:  reports that he has never smoked. He does not have any smokeless tobacco history on file. He reports that he does not drink alcohol or use illicit drugs.  Allergies  Allergen Reactions  . Mango Flavor Rash    Severe rash    Family History:  Family History  Problem Relation Age of Onset  . Diabetes      GM  . Coronary artery disease     . Hyperlipidemia    . Prostate cancer    . Colon cancer Neg Hx      Prior to Admission medications   Medication Sig Start Date End Date Taking? Authorizing Provider  furosemide (LASIX) 40 MG tablet Take 0.5 tablets (20 mg total) by mouth daily. 05/04/14  Yes Nishant Dhungel, MD  HYDROmorphone (DILAUDID) 2 MG tablet Take 1 tablet (2 mg total) by mouth every 4 (four) hours as needed for severe pain. 05/04/14  Yes Nishant Dhungel, MD  lactulose (CHRONULAC) 10 GM/15ML solution Take 30 mLs (20 g total) by mouth 3 (three) times daily. Please titrate to at least 2-3 lose stools daily 05/04/14  Yes Nishant Dhungel, MD  mirtazapine (REMERON) 7.5 MG tablet Take 7.5 mg by mouth at bedtime.    Yes Historical Provider, MD  spironolactone (ALDACTONE) 100 MG tablet Take 0.5 tablets (50 mg total) by mouth daily. 05/04/14  Yes Nishant Dhungel, MD   Physical Exam: Filed Vitals:   05/07/14 0420 05/07/14 0432 05/07/14 0502 05/07/14 0620  BP:  120/79  114/75  Pulse:  113  100  Temp:  97.7 F (36.5 C) 98.6 F (37 C) 97.5 F (36.4 C)  TempSrc:  Oral Rectal Oral  Resp:  24  24  Height:  5\' 9"  (1.753 m)    Weight:  72.122 kg (159 lb)    SpO2: 98% 98%  98%    Physical Exam  Constitutional: Appears well-developed and well-nourished. No distress.  HENT: Normocephalic. No tonsillar erythema or exudates Eyes: icteric sclera, skin jaundiced   Neck: Normal ROM. Neck supple. No JVD. No tracheal deviation. No thyromegaly.  CVS: RRR, S1/S2 +, no murmurs, no gallops, no carotid bruit.  Pulmonary: Effort and breath sounds normal, no stridor, rhonchi, wheezes, rales.  Abdominal: distended with fluid wave (+), no rebound or guarding.  Musculoskeletal: Normal range of motion. +2-3 LE pitting edema but no tenderness.  Lymphadenopathy: No lymphadenopathy noted, cervical, inguinal. Neuro: Alert. Normal reflexes, muscle tone coordination.  Skin: Skin is warm and dry. Jaundiced  Psychiatric: Normal mood and affect.  Behavior, judgment, thought content normal.   Labs on Admission:  Basic Metabolic Panel:  Recent Labs Lab 05/02/14 0518 05/02/14 1516 05/03/14 0947 05/04/14 0449 05/07/14 0454  NA 131* 128* 123* 124* 125*  K 4.6 4.3 4.2 4.3 3.3*  CL 98 94* 90* 87* 89*  CO2 25 27 24 26 26   GLUCOSE 90 154* 137* 112* 91  BUN 9 9 8 7 12   CREATININE 0.82 0.88 0.71 0.67 0.74  CALCIUM 7.3* 7.3* 7.4* 7.8* 7.9*   Liver Function Tests:  Recent Labs Lab 04/30/14 1112 05/02/14 0518 05/02/14 1516 05/07/14 0454  AST 64* 66* 60* 64*  ALT 47 37 38 32  ALKPHOS 105 103 109 112  BILITOT 4.8* 4.1* 3.8* 5.3*  PROT 5.5* 4.8* 5.1* 6.1  ALBUMIN 1.9* 1.6* 1.5* 1.9*    Recent Labs Lab 04/30/14 1112 05/07/14 0454  LIPASE 42 105*    Recent Labs Lab 04/30/14 1112  AMMONIA 14   CBC:  Recent Labs Lab 04/30/14 1112 05/02/14 0518 05/03/14 0544 05/07/14 0454  WBC 10.8* 9.3 15.0* 15.3*  NEUTROABS 8.7*  --   --  11.0*  HGB 9.3* 8.0* 9.6* 9.8*  HCT 28.9* 25.5* 29.5* 30.2*  MCV 86.3 87.3 86.8 86.5  PLT 68* 57* 68* 84*   Cardiac Enzymes: No results for input(s): CKTOTAL, CKMB, CKMBINDEX, TROPONINI in the last 168 hours. BNP: Invalid input(s): POCBNP CBG: No results for input(s): GLUCAP in the last 168 hours.  If 7PM-7AM, please contact night-coverage [email or web address] Password [email or web address] 05/07/2014, 7:09 AM

## 2014-05-07 NOTE — Care Management Note (Signed)
    Page 1 of 1   05/07/2014     4:26:32 PM CARE MANAGEMENT NOTE 05/07/2014  Patient:  Bradley Woods, Bradley Woods   Account Number:  [id number]  Date Initiated:  05/07/2014  Documentation initiated by:  Dessa Phi  Subjective/Objective Assessment:   30 Guys W/ABD [place] TRANSPLANT WAIT LIST @ [facility],HEP C.READMIT-11/5-11/9/15     Action/Plan:   FROM HOME.   Anticipated DC Date:  05/11/2014   Anticipated DC Plan:  [place]  CM consult      Choice offered to / List presented to:             Status of service:  In process, will continue to follow Medicare Important Message given?   (If response is "NO", the following Medicare IM given date fields will be blank) Date Medicare IM given:   Medicare IM given by:   Date Additional Medicare IM given:   Additional Medicare IM given by:    Discharge Disposition:    Per UR Regulation:  Reviewed for med. necessity/level of care/duration of stay  If discussed at Long Length of Stay Meetings, dates discussed:    Comments:  05/07/14 Shayma Pfefferle RN,BSN NCM [phone] NO ANTICIPATED D/C NEEDS.

## 2014-05-07 NOTE — ED Notes (Signed)
Bed: YV85 Expected date:  Expected time:  Means of arrival:  Comments: EMS 58yo M; SHOB. Ascites / hx of liver failure

## 2014-05-07 NOTE — ED Notes (Signed)
Gave report to Janetta Hora RN

## 2014-05-07 NOTE — ED Notes (Signed)
Provided patient a cup of ice water. Informed of waiting for an assigned bed.

## 2014-05-07 NOTE — ED Notes (Signed)
Provided patient water with permission from provider.

## 2014-05-07 NOTE — ED Notes (Signed)
Pt in radiology 

## 2014-05-07 NOTE — ED Notes (Addendum)
Per EMS, patient is complaining of increased shortness of breath and generalized weakness. Patient was at [facility] 2 weeks with being diagnosed of pneumonia. He has an appointment to see his PCP in AM. Also, has abd distention related to liver failure. On last visit, he had to have paracentesis. Patient is on the list at [facility] for a liver transplant. For this transport, patient was able to ambulate to the stretcher for EMS.

## 2014-05-08 DIAGNOSIS — R140 Abdominal distension (gaseous): Secondary | ICD-10-CM

## 2014-05-08 DIAGNOSIS — K7290 Hepatic failure, unspecified without coma: Secondary | ICD-10-CM

## 2014-05-08 DIAGNOSIS — R188 Other ascites: Secondary | ICD-10-CM

## 2014-05-08 LAB — CBC
HCT: 27.5 % — ABNORMAL LOW (ref 39.0–52.0)
HEMOGLOBIN: 8.8 g/dL — AB (ref 13.0–17.0)
MCH: 28.5 pg (ref 26.0–34.0)
MCHC: 32 g/dL (ref 30.0–36.0)
MCV: 89 fL (ref 78.0–100.0)
Platelets: 62 10*3/uL — ABNORMAL LOW (ref 150–400)
RBC: 3.09 MIL/uL — AB (ref 4.22–5.81)
RDW: 21.2 % — ABNORMAL HIGH (ref 11.5–15.5)
WBC: 11.9 10*3/uL — AB (ref 4.0–10.5)

## 2014-05-08 LAB — COMPREHENSIVE METABOLIC PANEL
ALBUMIN: 1.6 g/dL — AB (ref 3.5–5.2)
ALK PHOS: 101 U/L (ref 39–117)
ALT: 28 U/L (ref 0–53)
ANION GAP: 9 (ref 5–15)
AST: 59 U/L — ABNORMAL HIGH (ref 0–37)
BUN: 12 mg/dL (ref 6–23)
CALCIUM: 7.5 mg/dL — AB (ref 8.4–10.5)
CO2: 25 mEq/L (ref 19–32)
Chloride: 90 mEq/L — ABNORMAL LOW (ref 96–112)
Creatinine, Ser: 0.77 mg/dL (ref 0.50–1.35)
GFR calc Af Amer: >90 mL/min (ref >90)
GFR calc non Af Amer: >90 mL/min (ref >90)
Glucose, Bld: 105 mg/dL — ABNORMAL HIGH (ref 70–99)
POTASSIUM: 4 meq/L (ref 3.7–5.3)
Sodium: 124 mEq/L — ABNORMAL LOW (ref 137–147)
TOTAL PROTEIN: 5.3 g/dL — AB (ref 6.0–8.3)
Total Bilirubin: 4.8 mg/dL — ABNORMAL HIGH (ref 0.3–1.2)

## 2014-05-08 LAB — PROTIME-INR
INR: 2.59 — ABNORMAL HIGH (ref 0.00–1.49)
PROTHROMBIN TIME: 28 s — AB (ref 11.6–15.2)

## 2014-05-08 LAB — MRSA PCR SCREENING: MRSA BY PCR: NEGATIVE

## 2014-05-08 LAB — HEMOGLOBIN AND HEMATOCRIT, BLOOD
HCT: 30.2 % — ABNORMAL LOW (ref 39.0–52.0)
Hemoglobin: 9.8 g/dL — ABNORMAL LOW (ref 13.0–17.0)

## 2014-05-08 LAB — GLUCOSE, CAPILLARY: Glucose-Capillary: 81 mg/dL (ref 70–99)

## 2014-05-08 MED ORDER — SODIUM CHLORIDE 0.9 % IV SOLN
Freq: Once | INTRAVENOUS | Status: AC
  Administered 2014-05-08: 17:00:00 via INTRAVENOUS

## 2014-05-08 MED ORDER — PEG-KCL-NACL-NASULF-NA ASC-C 100 G PO SOLR
0.5000 | Freq: Once | ORAL | Status: AC
  Administered 2014-05-08: 100 g via ORAL
  Filled 2014-05-08 (×2): qty 1

## 2014-05-08 MED ORDER — SODIUM CHLORIDE 0.9 % IV SOLN
INTRAVENOUS | Status: DC
  Administered 2014-05-08: 18:00:00 via INTRAVENOUS
  Administered 2014-05-09: 500 mL via INTRAVENOUS

## 2014-05-08 MED ORDER — CEFTRIAXONE SODIUM IN DEXTROSE 40 MG/ML IV SOLN
2.0000 g | INTRAVENOUS | Status: DC
  Administered 2014-05-08 – 2014-05-10 (×3): 2 g via INTRAVENOUS
  Filled 2014-05-08 (×5): qty 50

## 2014-05-08 MED ORDER — VITAMIN K1 10 MG/ML IJ SOLN
10.0000 mg | Freq: Once | INTRAVENOUS | Status: AC
  Administered 2014-05-08: 10 mg via INTRAVENOUS
  Filled 2014-05-08 (×2): qty 1

## 2014-05-08 MED ORDER — PEG-KCL-NACL-NASULF-NA ASC-C 100 G PO SOLR
0.5000 | Freq: Once | ORAL | Status: DC
  Filled 2014-05-08: qty 1

## 2014-05-08 MED ORDER — DEXTROSE 5 % IV SOLN: 1.0000 g | INTRAVENOUS | Status: DC

## 2014-05-08 MED ORDER — PANTOPRAZOLE SODIUM 40 MG IV SOLR
40.0000 mg | Freq: Two times a day (BID) | INTRAVENOUS | Status: DC
  Administered 2014-05-08 – 2014-05-12 (×9): 40 mg via INTRAVENOUS
  Filled 2014-05-08 (×10): qty 40

## 2014-05-08 MED ORDER — PEG-KCL-NACL-NASULF-NA ASC-C 100 G PO SOLR: 1.0000 | Freq: Once | ORAL | Status: DC

## 2014-05-08 NOTE — Progress Notes (Signed)
[facility] PROGRESS NOTE  Bradley Woods XNA:[id number] DOB: 1955/10/21 DOA: 05/07/2014 PCP: [name], MD  Assessment/Plan: 1-GI bleed: hematochezia.  INR at 2.5 will transfuse 2 units FFP.  Cycle HB transfusion as needed.  Hold diuretics.  GI consulted.  Start Ceftriaxone for SBP prophylaxis.  Start IV protonix.   2-Abdominal Pain: secondary to ascites. Improved after paracentesis. Paracentesis 11-13 yielding 3.8 L.  Start Ceftriaxone SBP prophylaxis in setting GI bleed.   3-Cirrhosis, secondary to hepatitis C, hepatocellular carcinoma;  Vitamin K.  Hold diuretics.  On transplant list at [facility].  Continue with lactulose.   4-Anemia; acute blood loss ; in setting GI bleed. Cycle Hb.  Transfusion as needed.   5-Chronic Hyponatremia; in setting liver failure. Monitor.    Code Status: Full code.  Family Communication: Care discussed with wife.  Disposition Plan: Transfer to step down.    Consultants:  GI  Procedures:  none  Antibiotics:  Ceftriaxone; SBP prophylaxis.   HPI/Subjective: Patient report 2 Bloody Bm today.  Relates abdominal pain better after having paracentesis./  He is feeling tired , sluggish.   Objective: Filed Vitals:   05/08/14 0448  BP: 111/62  Pulse: 107  Temp: 98.2 F (36.8 C)  Resp: 20    Intake/Output Summary (Last 24 hours) at 05/08/14 1109 Last data filed at 05/08/14 0800  Gross per 24 hour  Intake    960 ml  Output      0 ml  Net    960 ml   Filed Weights   05/07/14 0432 05/08/14 0448  Weight: 72.122 kg (159 lb) 68.992 kg (152 lb 1.6 oz)    Exam:   General:  Alert in no distress.   Cardiovascular: S 1, S 2 RRR  Respiratory: CTA  Abdomen: Bs present, distended, no rigidity no guarding.   Musculoskeletal: no edema  Data Reviewed: Basic Metabolic Panel:  Recent Labs Lab 05/02/14 1516 05/03/14 0947 05/04/14 0449 05/07/14 0454 05/07/14 0545 05/08/14 0416  NA 128* 123* 124* 125*  --  124*  K  4.3 4.2 4.3 3.3*  --  4.0  CL 94* 90* 87* 89*  --  90*  CO2 27 24 26 26   --  25  GLUCOSE 154* 137* 112* 91  --  105*  BUN 9 8 7 12   --  12  CREATININE 0.88 0.71 0.67 0.74  --  0.77  CALCIUM 7.3* 7.4* 7.8* 7.9*  --  7.5*  MG  --   --   --   --  1.9  --   PHOS  --   --   --   --  2.2*  --    Liver Function Tests:  Recent Labs Lab 05/02/14 0518 05/02/14 1516 05/07/14 0454 05/08/14 0416  AST 66* 60* 64* 59*  ALT 37 38 32 28  ALKPHOS 103 109 112 101  BILITOT 4.1* 3.8* 5.3* 4.8*  PROT 4.8* 5.1* 6.1 5.3*  ALBUMIN 1.6* 1.5* 1.9* 1.6*    Recent Labs Lab 05/07/14 0454  LIPASE 105*   No results for input(s): AMMONIA in the last 168 hours. CBC:  Recent Labs Lab 05/02/14 0518 05/03/14 0544 05/07/14 0454 05/08/14 0416  WBC 9.3 15.0* 15.3* 11.9*  NEUTROABS  --   --  11.0*  --   HGB 8.0* 9.6* 9.8* 8.8*  HCT 25.5* 29.5* 30.2* 27.5*  MCV 87.3 86.8 86.5 89.0  PLT 57* 68* 84* 62*   Cardiac Enzymes: No results for input(s): CKTOTAL, CKMB, CKMBINDEX, TROPONINI in  the last 168 hours. BNP (last 3 results) No results for input(s): PROBNP in the last 8760 hours. CBG:  Recent Labs Lab 05/07/14 0755 05/08/14 0734  GLUCAP 70 81    Recent Results (from the past 240 hour(s))  Culture, blood (routine x 2)     Status: None   Collection Time: 04/30/14 12:56 PM  Result Value Ref Range Status   Specimen Description BLOOD LEFT ARM  Final   Special Requests BOTTLES DRAWN AEROBIC AND ANAEROBIC 5 CC EACH  Final   Culture  Setup Time   Final    04/30/2014 17:08 Performed at [facility]    Culture   Final    NO GROWTH 5 DAYS Performed at [facility]    Report Status 05/06/2014 FINAL  Final  Culture, blood (routine x 2)     Status: None   Collection Time: 04/30/14 12:57 PM  Result Value Ref Range Status   Specimen Description BLOOD RIGHT HAND  Final   Special Requests BOTTLES DRAWN AEROBIC AND ANAEROBIC 5 CC  Final   Culture  Setup Time   Final    04/30/2014  17:08 Performed at [facility]   Final    NO GROWTH 5 DAYS Performed at [facility]    Report Status 05/06/2014 FINAL  Final  Body fluid culture     Status: None   Collection Time: 05/01/14  2:24 PM  Result Value Ref Range Status   Specimen Description PERITONEAL CAVITY  Final   Special Requests Normal  Final   Gram Stain   Final    NO WBC SEEN NO ORGANISMS SEEN Performed at [facility]    Culture   Final    NO GROWTH 3 DAYS Performed at [facility]    Report Status 05/05/2014 FINAL  Final  Culture, sputum-assessment     Status: None   Collection Time: 05/02/14 10:43 AM  Result Value Ref Range Status   Specimen Description SPUTUM  Final   Special Requests Normal  Final   Sputum evaluation   Final    THIS SPECIMEN IS ACCEPTABLE. RESPIRATORY CULTURE REPORT TO FOLLOW.   Report Status 05/02/2014 FINAL  Final  Culture, respiratory (NON-Expectorated)     Status: None   Collection Time: 05/02/14 10:43 AM  Result Value Ref Range Status   Specimen Description SPUTUM  Final   Special Requests NONE  Final   Gram Stain   Final    RARE WBC PRESENT, PREDOMINANTLY PMN FEW SQUAMOUS EPITHELIAL CELLS PRESENT FEW YEAST WITH PSEUDOHYPHAE Performed at [facility]    Culture   Final    MODERATE CANDIDA ALBICANS Performed at [facility]    Report Status 05/04/2014 FINAL  Final     Studies: Dg Chest 2 View  05/07/2014   CLINICAL DATA:  Cough, shortness of breath, ascites. History of liver failure.  EXAM: CHEST  2 VIEW  COMPARISON:  PA and lateral chest 04/30/2014.  FINDINGS: Airspace disease throughout the right chest seen on the prior study has markedly improved. The left lung appears clear. Heart size is normal. No pneumothorax or pleural effusion. Marked degenerative change right shoulder is noted.  IMPRESSION: Marked improvement in airspace disease in the right chest consistent with resolving pneumonia. No new  abnormality.   Electronically Signed   By: Inge Rise M.D.   On: 05/07/2014 05:31   US Paracentesis  05/07/2014   CLINICAL DATA:  Cirrhosis, hepatitis-C, recurrent  ascites. Request for therapeutic paracentesis.  EXAM: ULTRASOUND GUIDED PARACENTESIS  COMPARISON:  Previous paracentesis  PROCEDURE: An ultrasound guided paracentesis was thoroughly discussed with the patient and questions answered. The benefits, risks, alternatives and complications were also discussed. The patient understands and wishes to proceed with the procedure. Written consent was obtained.  Ultrasound was performed to localize and mark an adequate pocket of fluid in the right lower quadrant of the abdomen. The area was then prepped and draped in the normal sterile fashion. 1% Lidocaine was used for local anesthesia. Under ultrasound guidance a 6 French Safe-T-Centesis catheter was introduced. Paracentesis was performed. The catheter was removed and a dressing applied.  COMPLICATIONS: None  FINDINGS: A total of approximately 3.8 L of clear yellow fluid was removed. A fluid sample was not sent for laboratory analysis.  IMPRESSION: Successful ultrasound guided paracentesis yielding 3.8 L of ascites.  Read by: Ascencion Dike PA-C   Electronically Signed   By: Corrie Mckusick D.O.   On: 05/07/2014 10:58    Scheduled Meds: . antiseptic oral rinse  7 mL Mouth Rinse BID  . furosemide  20 mg Oral Daily  . lactulose  20 g Oral TID  . mirtazapine  7.5 mg Oral QHS  . sodium chloride  3 mL Intravenous Q12H  . sodium chloride  3 mL Intravenous Q12H  . spironolactone  50 mg Oral Daily   Continuous Infusions: . sodium chloride      Principal Problem:   Abdominal distension Active Problems:   HTN (hypertension)   Thrombocytopenia   Ascites   Coagulopathy   Severe protein-calorie malnutrition   Hyponatremia   Depression with anxiety   Decompensation of cirrhosis of liver   Hepatocellular carcinoma   SOB (shortness of breath)    Weakness generalized   Leukocytosis   Anemia of chronic disease    Time spent: 35 minutes.     Niel Hummer A  [facility] Pager [phone]. If 7PM-7AM, please contact night-coverage at [email or web address], password [facility] 05/08/2014, 11:09 AM  LOS: 1 day

## 2014-05-08 NOTE — Consult Note (Addendum)
Referring Provider: No ref. provider found Primary Care Physician:  [name], MD Primary Gastroenterologist:  ? Dr. Deatra Ina (last seen 2009), but now follows at [facility]  Reason for Consultation:  Rectal bleeding  HPI: Bradley Woods is a 58 y.o. 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  Recent hospitalization and discharge from [facility] on 05/04/2014 for decompensated liver cirrhosis.  Underwent paracentesis on 11/6 with 5 Liters removed (negative for SBP at this time) and 11/9 with 2.8 Liters removed.  He presented back to [facility] ED 05/07/2014 with worsening swelling and abdominal distention along with SOB. Patient reported he started to feel weak one day prior to this admission with difficult ambulation.  In ED, blood pressure was 114/75, heart rate 100, T max 98.6 F and oxygen saturation 98% on room air. Blood work revealed white blood cell count of 15.2, hemoglobin 9.8, platelets 84, sodium 125, potassium 3.3 and INR of 2.35. Chest x-ray showed marked improvement in airspace disease in the right chest consistent with resolving pneumonia and no new abnormality.  Repeat paracentesis was performed again on 11/12 with 3.8 Liters removed.  Patient had bland particle embolization of segment VII HCC at the beginning of November, which was complicated by a right groin hematoma and required approximately 8 day hospital stay there.  Was also having issues with hyponatremia with sodium as low as 119.  Was followed by Dr. Damita Lack at that time and was discharged on lasix 20 mg and spironolactone 50 mg.  His wife says that he's been undergoing all pre-transplant testing.   GI is being consulted for rectal bleeding.  Says that he's not had any rectal bleeding issues until this morning.  Had what he calls a moderate amount of  red blood in the toilet; did not show anyone.  Had another episode later this morning with less amount of blood.  He always has loose stools because of the lactulose that he takes.  He does apparently have a history of colitis.  Last colonoscopy by Dr. Deatra Ina in 07/2005, which revealed the same; biopsies showed chronic active colitis.  Not on any medication at home for this for the past couple of years but was previously on Colazol and Canasa suppositories (per his wife's report, he just took it as needed in the past).  CT scan that was performed in October at [facility] suggested right colon thickening that may represent portal colopathy.  He was apparently scheduled for a colonoscopy at [facility] within the past week or so but missed it due to being hospitalized here.  Patient's wife says that she talked to the transplant coordinator this morning and she suggested doing colonoscopy up here, possibly while he is hospitalized.  Last EGD at [facility] on 03/30/2014 by Dr. Thana Farr showed only congestive gastropathy and medium amount of food residue in stomach (no varices).   Past Medical History  Diagnosis Date  . Hepatitis C   . Anxiety     dx w/ depression 07/2008  . Colitis   . Alcohol abuse, in remission   . Headache(784.0)   . Allergic rhinitis   . Hepatic failure due to alcoholism   . Ulcerative colitis   . Coagulopathy 01/21/2014  . Severe protein-calorie malnutrition 01/21/2014  . Depression with anxiety 04/30/2014  . Ascites 01/21/2014  . ALLERGIC  RHINITIS 04/28/2008    Qualifier: Diagnosis of  By: Larose Kells MD, [place] 04/04/2007    Annotation: cscope 08-2005 (had diarrhea) Qualifier: Diagnosis of  By: Larose Kells MD, [place] HYPERSOMNIA, ASSOCIATED WITH SLEEP APNEA 09/20/2009    Qualifier: Diagnosis of  By: Elsworth Soho MD, [place], HX OF 06/15/2009    Qualifier: Diagnosis of  By: Inda Castle FNP, [place]   . HTN (hypertension) 09/26/2010  . Liver failure 01/15/2014  .  Hepatic cirrhosis due to chronic hepatitis C infection 01/15/2014  . Anemia 01/19/2014  . Thrombocytopenia 01/19/2014  . Encephalopathy, hepatic 01/21/2014  . Hyponatremia 04/30/2014  . Hypoalbuminemia 04/30/2014    Past Surgical History  Procedure Laterality Date  . Tonsillectomy    . Paracentesis      Prior to Admission medications   Medication Sig Start Date End Date Taking? Authorizing Provider  furosemide (LASIX) 40 MG tablet Take 0.5 tablets (20 mg total) by mouth daily. 05/04/14  Yes Nishant Dhungel, MD  HYDROmorphone (DILAUDID) 2 MG tablet Take 1 tablet (2 mg total) by mouth every 4 (four) hours as needed for severe pain. 05/04/14  Yes Nishant Dhungel, MD  lactulose (CHRONULAC) 10 GM/15ML solution Take 30 mLs (20 g total) by mouth 3 (three) times daily. Please titrate to at least 2-3 lose stools daily 05/04/14  Yes Nishant Dhungel, MD  mirtazapine (REMERON) 7.5 MG tablet Take 7.5 mg by mouth at bedtime.    Yes Historical Provider, MD  spironolactone (ALDACTONE) 100 MG tablet Take 0.5 tablets (50 mg total) by mouth daily. 05/04/14  Yes Nishant Dhungel, MD    Current Facility-Administered Medications  Medication Dose Route Frequency Provider Last Rate Last Dose  . 0.9 %  sodium chloride infusion   Intravenous Continuous Robbie Lis, MD      . 0.9 %  sodium chloride infusion  250 mL Intravenous PRN Robbie Lis, MD      . 0.9 %  sodium chloride infusion   Intravenous Once Belkys A Regalado, MD      . antiseptic oral rinse (CPC / CETYLPYRIDINIUM CHLORIDE 0.05%) solution 7 mL  7 mL Mouth Rinse BID Robbie Lis, MD   7 mL at 05/07/14 2236  . HYDROmorphone (DILAUDID) tablet 2 mg  2 mg Oral Q4H PRN Robbie Lis, MD   2 mg at 05/08/14 2671  . lactulose (CHRONULAC) 10 GM/15ML solution 20 g  20 g Oral TID Robbie Lis, MD   20 g at 05/08/14 0909  . mirtazapine (REMERON) tablet 7.5 mg  7.5 mg Oral QHS Robbie Lis, MD   7.5 mg at 05/07/14 2236  . ondansetron (ZOFRAN) tablet 4 mg  4 mg Oral  Q6H PRN Robbie Lis, MD       Or  . ondansetron [facility]) injection 4 mg  4 mg Intravenous Q6H PRN Robbie Lis, MD      . pantoprazole (PROTONIX) injection 40 mg  40 mg Intravenous Q12H Belkys A Regalado, MD   40 mg at 05/08/14 1226  . sodium chloride 0.9 % injection 3 mL  3 mL Intravenous Q12H Robbie Lis, MD   3 mL at 05/08/14 0915  . sodium chloride 0.9 % injection 3 mL  3 mL Intravenous Q12H Robbie Lis, MD   3 mL at 05/08/14 0911  . sodium chloride 0.9 % injection 3 mL  3 mL Intravenous PRN Robbie Lis,  MD        Allergies as of 05/07/2014 - Review Complete 05/07/2014  Allergen Reaction Noted  . Mango flavor Rash 05/07/2014    Family History  Problem Relation Age of Onset  . Diabetes      GM  . Coronary artery disease    . Hyperlipidemia    . Prostate cancer    . Colon cancer Neg Hx     History   Social History  . Marital Status: Divorced    Spouse Name: N/A    Number of Children: 1  . Years of Education: N/A   Occupational History  . Not on file.   Social History Main Topics  . Smoking status: Never Smoker   . Smokeless tobacco: Not on file  . Alcohol Use: No     Comment: Quit in 1988.  . Drug Use: No  . Sexual Activity: Not on file   Other Topics Concern  . Not on file   Social History Narrative   Single father   seperated (2008) but good relationship w/ wife   On disability   Daily caffeine use   Pt is on Liver Transplace @ [facility]: Ten point ROS is O/W negative except as mentioned in HPI.  Physical Exam: Vital signs in last 24 hours: Temp:  [97.5 F (36.4 C)-98.2 F (36.8 C)] 97.5 F (36.4 C) (11/13 1134) Pulse Rate:  [102-109] 109 (11/13 1134) Resp:  [20-24] 20 (11/13 1134) BP: (104-117)/(60-70) 104/62 mmHg (11/13 1134) SpO2:  [97 %-99 %] 97 % (11/13 1134) Weight:  [152 lb 1.6 oz (68.992 kg)] 152 lb 1.6 oz (68.992 kg) (11/13 0448) Last BM Date: 05/08/14 General:  Alert, chronically  ill-appearing, in NAD Head:  Normocephalic and atraumatic. Eyes:  Scleral icterus present. Ears:  Normal auditory acuity. Mouth:  No deformity or lesions.   Lungs:  Clear throughout to auscultation.  No wheezes, crackles, or rhonchi.  Heart:  Tachy.  No murmurs noted. Abdomen:  Soft, mild distended.  BS present.  Mild diffuse TTP without R/R/G.   Msk:  Symmetrical without gross deformities. Pulses:  Normal pulses noted. Extremities:  Edema noted in B/L LE's Neurologic:  Alert and oriented x4;  grossly normal neurologically. Skin:  Intact without significant lesions or rashes.  Intake/Output from previous day: 11/12 0701 - 11/13 0700 In: 793 [P.O.:840; I.V.:3] Out: -  Intake/Output this shift: Total I/O In: 120 [P.O.:120] Out: -   Lab Results:  Recent Labs  05/07/14 0454 05/08/14 0416  WBC 15.3* 11.9*  HGB 9.8* 8.8*  HCT 30.2* 27.5*  PLT 84* 62*   BMET  Recent Labs  05/07/14 0454 05/08/14 0416  NA 125* 124*  K 3.3* 4.0  CL 89* 90*  CO2 26 25  GLUCOSE 91 105*  BUN 12 12  CREATININE 0.74 0.77  CALCIUM 7.9* 7.5*   LFT  Recent Labs  05/08/14 0416  PROT 5.3*  ALBUMIN 1.6*  AST 59*  ALT 28  ALKPHOS 101  BILITOT 4.8*   PT/INR  Recent Labs  05/07/14 0454 05/08/14 1131  LABPROT 25.9* 28.0*  INR 2.35* 2.59*   Studies/Results: Dg Chest 2 View  05/07/2014   CLINICAL DATA:  Cough, shortness of breath, ascites. History of liver failure.  EXAM: CHEST  2 VIEW  COMPARISON:  PA and lateral chest 04/30/2014.  FINDINGS: Airspace disease throughout the right chest seen on the prior study has markedly improved. The  left lung appears clear. Heart size is normal. No pneumothorax or pleural effusion. Marked degenerative change right shoulder is noted.  IMPRESSION: Marked improvement in airspace disease in the right chest consistent with resolving pneumonia. No new abnormality.   Electronically Signed   By: Inge Rise M.D.   On: 05/07/2014 05:31   US  Paracentesis  05/07/2014   CLINICAL DATA:  Cirrhosis, hepatitis-C, recurrent ascites. Request for therapeutic paracentesis.  EXAM: ULTRASOUND GUIDED PARACENTESIS  COMPARISON:  Previous paracentesis  PROCEDURE: An ultrasound guided paracentesis was thoroughly discussed with the patient and questions answered. The benefits, risks, alternatives and complications were also discussed. The patient understands and wishes to proceed with the procedure. Written consent was obtained.  Ultrasound was performed to localize and mark an adequate pocket of fluid in the right lower quadrant of the abdomen. The area was then prepped and draped in the normal sterile fashion. 1% Lidocaine was used for local anesthesia. Under ultrasound guidance a 6 French Safe-T-Centesis catheter was introduced. Paracentesis was performed. The catheter was removed and a dressing applied.  COMPLICATIONS: None  FINDINGS: A total of approximately 3.8 L of clear yellow fluid was removed. A fluid sample was not sent for laboratory analysis.  IMPRESSION: Successful ultrasound guided paracentesis yielding 3.8 L of ascites.  Read by: Ascencion Dike PA-C   Electronically Signed   By: Corrie Mckusick D.O.   On: 05/07/2014 10:58    IMPRESSION:  -Decompensated cirrhosis secondary to ETOH and Hep C:  Under the care of Dr. Damita Lack with transplant at [facility].  This is complicated by new [place] that was recently embolized.  Also ascites, hyponatremia, HSE, thrombocytopenia, coagulopathy, and lower extremity edema. -Rectal bleeding:  History of colitis for which he was previously taking Colazol and Canasa suppositories, but has not taken them for a couple of years.  Bleeding may be related to colitis or hemorrhoids, diverticular disease, etc.  PLAN: -Patient's wife is asking for inpatient colonoscopy since he needs this done for his pre-transplant evaluation (she says that the transplant coordinator recommended this today as well).  He was scheduled for one within the  past week or so at [facility] but has been hospitalized here.  Patient's wife feels that it may be easier for [facility] to do the prep while he is here as well with the help of the nursing staff due to difficulty with ambulation and mobilization recently. -Any major decisions regarding his liver care should be left to Dr. Damita Lack and the transplant team at [facility]. -? Possibility of transfer to [facility] if patient fails to improve enough to be discharged with outpatient follow-up at [facility] without being re-hospitalized within 48 hours.   ZEHR, JESSICA D.  05/08/2014, 12:44 PM  Pager number [phone]     ________________________________________________________________________  Velora Heckler GI MD note:  I personally examined the patient, reviewed the data and agree with the assessment and plan described above. Very nice man, gets his care at [facility] for the past 8-10 years.  Undergoing evaluation for liver transplant.  Wife in contact with transplant coordinators.  He currently has MELD of 21, that is without the 'bonus points' for hepatoma.  Currently in hospital with really FTT, ascites, acute on chronic liver decompensation.  Issue we've been asked to see him for is minor hematochezia. He has h/o colitis Deatra Ina colonoscopy 2007) and that is probably the source now.  He needs colonoscopy to confirm, rule out other sources, also needs it as pre-transplant evaluation.  Reasonable to do this while  in [place]. Will plan on that for tomorrow AM, given INR elevation (2.6) will given Vit K and FFP transfusion but still it will likely be diagnostic only.     Owens Loffler, MD [facility] Pager [phone]

## 2014-05-08 NOTE — Progress Notes (Signed)
PT Cancellation Note  Patient Details Name: Netanel Yannuzzi MRN: [id number] DOB: [birth year]   Cancelled Treatment:    Reason Eval/Treat Not Completed: Other--pt transferred to ICU. Will hold PT eval today and check back another day.    Weston Anna, MPT Pager: [phone]

## 2014-05-09 ENCOUNTER — Encounter (HOSPITAL_COMMUNITY)
Admission: EM | Disposition: A | Discharge status: Home / Self Care | Payer: Self-pay | Source: Home / Self Care | Attending: Internal Medicine

## 2014-05-09 ENCOUNTER — Encounter (HOSPITAL_COMMUNITY): Payer: Self-pay

## 2014-05-09 DIAGNOSIS — K922 Gastrointestinal hemorrhage, unspecified: Secondary | ICD-10-CM

## 2014-05-09 HISTORY — PX: COLONOSCOPY: SHX5424

## 2014-05-09 LAB — COMPREHENSIVE METABOLIC PANEL
ALBUMIN: 2 g/dL — AB (ref 3.5–5.2)
ALK PHOS: 98 U/L (ref 39–117)
ALT: 32 U/L (ref 0–53)
ANION GAP: 14 (ref 5–15)
AST: 69 U/L — AB (ref 0–37)
BUN: 11 mg/dL (ref 6–23)
CO2: 23 mEq/L (ref 19–32)
Calcium: 8 mg/dL — ABNORMAL LOW (ref 8.4–10.5)
Chloride: 91 mEq/L — ABNORMAL LOW (ref 96–112)
Creatinine, Ser: 0.78 mg/dL (ref 0.50–1.35)
GFR calc Af Amer: >90 mL/min (ref >90)
GFR calc non Af Amer: >90 mL/min (ref >90)
Glucose, Bld: 92 mg/dL (ref 70–99)
Potassium: 4.3 mEq/L (ref 3.7–5.3)
Sodium: 128 mEq/L — ABNORMAL LOW (ref 137–147)
Total Bilirubin: 6.8 mg/dL — ABNORMAL HIGH (ref 0.3–1.2)
Total Protein: 5.8 g/dL — ABNORMAL LOW (ref 6.0–8.3)

## 2014-05-09 LAB — CBC
HEMATOCRIT: 29.4 % — AB (ref 39.0–52.0)
HEMOGLOBIN: 9.4 g/dL — AB (ref 13.0–17.0)
MCH: 28.3 pg (ref 26.0–34.0)
MCHC: 32 g/dL (ref 30.0–36.0)
MCV: 88.6 fL (ref 78.0–100.0)
Platelets: 60 10*3/uL — ABNORMAL LOW (ref 150–400)
RBC: 3.32 MIL/uL — AB (ref 4.22–5.81)
RDW: 20.7 % — AB (ref 11.5–15.5)
WBC: 14.1 10*3/uL — AB (ref 4.0–10.5)

## 2014-05-09 LAB — BASIC METABOLIC PANEL
Anion gap: 16 — ABNORMAL HIGH (ref 5–15)
BUN: 11 mg/dL (ref 6–23)
CALCIUM: 7.8 mg/dL — AB (ref 8.4–10.5)
CO2: 21 mEq/L (ref 19–32)
Chloride: 89 mEq/L — ABNORMAL LOW (ref 96–112)
Creatinine, Ser: 0.86 mg/dL (ref 0.50–1.35)
GFR calc Af Amer: >90 mL/min (ref >90)
GLUCOSE: 192 mg/dL — AB (ref 70–99)
Potassium: 4.2 mEq/L (ref 3.7–5.3)
Sodium: 126 mEq/L — ABNORMAL LOW (ref 137–147)

## 2014-05-09 LAB — PROTIME-INR
INR: 2.05 — ABNORMAL HIGH (ref 0.00–1.49)
Prothrombin Time: 23.3 seconds — ABNORMAL HIGH (ref 11.6–15.2)

## 2014-05-09 LAB — GLUCOSE, CAPILLARY: Glucose-Capillary: 90 mg/dL (ref 70–99)

## 2014-05-09 SURGERY — COLONOSCOPY
Anesthesia: Moderate Sedation

## 2014-05-09 MED ORDER — DIPHENHYDRAMINE HCL 50 MG/ML IJ SOLN
INTRAMUSCULAR | Status: AC
  Filled 2014-05-09: qty 1

## 2014-05-09 MED ORDER — HYDROMORPHONE HCL 2 MG PO TABS
1.0000 mg | ORAL_TABLET | ORAL | Status: DC | PRN
  Administered 2014-05-09 – 2014-05-13 (×16): 2 mg via ORAL
  Filled 2014-05-09 (×18): qty 1

## 2014-05-09 MED ORDER — MIDAZOLAM HCL 10 MG/2ML IJ SOLN
INTRAMUSCULAR | Status: AC
  Filled 2014-05-09: qty 2

## 2014-05-09 MED ORDER — FUROSEMIDE 20 MG PO TABS
20.0000 mg | ORAL_TABLET | Freq: Every day | ORAL | Status: DC
  Administered 2014-05-09 – 2014-05-13 (×5): 20 mg via ORAL
  Filled 2014-05-09 (×5): qty 1

## 2014-05-09 MED ORDER — FENTANYL CITRATE 0.05 MG/ML IJ SOLN
INTRAMUSCULAR | Status: DC | PRN
  Administered 2014-05-09 (×2): 25 ug via INTRAVENOUS

## 2014-05-09 MED ORDER — SPIRONOLACTONE 50 MG PO TABS
50.0000 mg | ORAL_TABLET | Freq: Every day | ORAL | Status: DC
  Administered 2014-05-09 – 2014-05-13 (×5): 50 mg via ORAL
  Filled 2014-05-09: qty 1
  Filled 2014-05-09: qty 2
  Filled 2014-05-09 (×3): qty 1

## 2014-05-09 MED ORDER — MIDAZOLAM HCL 5 MG/5ML IJ SOLN
INTRAMUSCULAR | Status: DC | PRN
  Administered 2014-05-09 (×2): 2 mg via INTRAVENOUS

## 2014-05-09 MED ORDER — PHYTONADIONE 5 MG PO TABS
5.0000 mg | ORAL_TABLET | Freq: Once | ORAL | Status: AC
  Administered 2014-05-09: 5 mg via ORAL
  Filled 2014-05-09: qty 1

## 2014-05-09 MED ORDER — FENTANYL CITRATE 0.05 MG/ML IJ SOLN
INTRAMUSCULAR | Status: AC
  Filled 2014-05-09: qty 2

## 2014-05-09 NOTE — H&P (View-Only) (Signed)
Referring Provider: No ref. provider found Primary Care Physician:  [name], MD Primary Gastroenterologist:  ? Dr. Deatra Ina (last seen 2009), but now follows at [facility]  Reason for Consultation:  Rectal bleeding  HPI: Bradley [name] is a 58 y.o. 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  Recent hospitalization and discharge from [facility] hospital on 05/04/2014 for decompensated liver cirrhosis.  Underwent paracentesis on 11/6 with 5 Liters removed (negative for SBP at this time) and 11/9 with 2.8 Liters removed.  He presented back to [facility] ED 05/07/2014 with worsening swelling and abdominal distention along with SOB. Patient reported he started to feel weak one day prior to this admission with difficult ambulation.  In ED, blood pressure was 114/75, heart rate 100, T max 98.6 F and oxygen saturation 98% on room air. Blood work revealed white blood cell count of 15.2, hemoglobin 9.8, platelets 84, sodium 125, potassium 3.3 and INR of 2.35. Chest x-ray showed marked improvement in airspace disease in the right chest consistent with resolving pneumonia and no new abnormality.  Repeat paracentesis was performed again on 11/12 with 3.8 Liters removed.  Patient had bland particle embolization of segment VII HCC at the beginning of November, which was complicated by a right groin hematoma and required approximately 8 day hospital stay there.  Was also having issues with hyponatremia with sodium as low as 119.  Was followed by Dr. Damita Lack at that time and was discharged on lasix 20 mg and spironolactone 50 mg.  His wife says that he's been undergoing all pre-transplant testing.   GI is being consulted for rectal bleeding.  Says that he's not had any rectal bleeding issues until this morning.  Had what he calls a moderate amount of  red blood in the toilet; did not show anyone.  Had another episode later this morning with less amount of blood.  He always has loose stools because of the lactulose that he takes.  He does apparently have a history of colitis.  Last colonoscopy by Dr. Deatra Ina in 07/2005, which revealed the same; biopsies showed chronic active colitis.  Not on any medication at home for this for the past couple of years but was previously on Colazol and Canasa suppositories (per his wife's report, he just took it as needed in the past).  CT scan that was performed in October at [facility] suggested right colon thickening that may represent portal colopathy.  He was apparently scheduled for a colonoscopy at [facility] within the past week or so but missed it due to being hospitalized here.  Patient's wife says that she talked to the transplant coordinator this morning and she suggested doing colonoscopy up here, possibly while he is hospitalized.  Last EGD at [facility] on 03/30/2014 by Dr. Thana Farr showed only congestive gastropathy and medium amount of food residue in stomach (no varices).   Past Medical History  Diagnosis Date  . Hepatitis C   . Anxiety     dx w/ depression 07/2008  . Colitis   . Alcohol abuse, in remission   . Headache(784.0)   . Allergic rhinitis   . Hepatic failure due to alcoholism   . Ulcerative colitis   . Coagulopathy 01/21/2014  . Severe protein-calorie malnutrition 01/21/2014  . Depression with anxiety 04/30/2014  . Ascites 01/21/2014  . ALLERGIC  RHINITIS 04/28/2008    Qualifier: Diagnosis of  By: Larose Kells MD, [place] 04/04/2007    Annotation: cscope 08-2005 (had diarrhea) Qualifier: Diagnosis of  By: Larose Kells MD, [place] HYPERSOMNIA, ASSOCIATED WITH SLEEP APNEA 09/20/2009    Qualifier: Diagnosis of  By: Elsworth Soho MD, [place], HX OF 06/15/2009    Qualifier: Diagnosis of  By: Inda Castle FNP, [place]   . HTN (hypertension) 09/26/2010  . Liver failure 01/15/2014  .  Hepatic cirrhosis due to chronic hepatitis C infection 01/15/2014  . Anemia 01/19/2014  . Thrombocytopenia 01/19/2014  . Encephalopathy, hepatic 01/21/2014  . Hyponatremia 04/30/2014  . Hypoalbuminemia 04/30/2014    Past Surgical History  Procedure Laterality Date  . Tonsillectomy    . Paracentesis      Prior to Admission medications   Medication Sig Start Date End Date Taking? Authorizing Provider  furosemide (LASIX) 40 MG tablet Take 0.5 tablets (20 mg total) by mouth daily. 05/04/14  Yes Nishant Dhungel, MD  HYDROmorphone (DILAUDID) 2 MG tablet Take 1 tablet (2 mg total) by mouth every 4 (four) hours as needed for severe pain. 05/04/14  Yes Nishant Dhungel, MD  lactulose (CHRONULAC) 10 GM/15ML solution Take 30 mLs (20 g total) by mouth 3 (three) times daily. Please titrate to at least 2-3 lose stools daily 05/04/14  Yes Nishant Dhungel, MD  mirtazapine (REMERON) 7.5 MG tablet Take 7.5 mg by mouth at bedtime.    Yes Historical Provider, MD  spironolactone (ALDACTONE) 100 MG tablet Take 0.5 tablets (50 mg total) by mouth daily. 05/04/14  Yes Nishant Dhungel, MD    Current Facility-Administered Medications  Medication Dose Route Frequency Provider Last Rate Last Dose  . 0.9 %  sodium chloride infusion   Intravenous Continuous Robbie Lis, MD      . 0.9 %  sodium chloride infusion  250 mL Intravenous PRN Robbie Lis, MD      . 0.9 %  sodium chloride infusion   Intravenous Once Belkys A Regalado, MD      . antiseptic oral rinse (CPC / CETYLPYRIDINIUM CHLORIDE 0.05%) solution 7 mL  7 mL Mouth Rinse BID Robbie Lis, MD   7 mL at 05/07/14 2236  . HYDROmorphone (DILAUDID) tablet 2 mg  2 mg Oral Q4H PRN Robbie Lis, MD   2 mg at 05/08/14 2458  . lactulose (CHRONULAC) 10 GM/15ML solution 20 g  20 g Oral TID Robbie Lis, MD   20 g at 05/08/14 0909  . mirtazapine (REMERON) tablet 7.5 mg  7.5 mg Oral QHS Robbie Lis, MD   7.5 mg at 05/07/14 2236  . ondansetron (ZOFRAN) tablet 4 mg  4 mg Oral  Q6H PRN Robbie Lis, MD       Or  . ondansetron [facility]) injection 4 mg  4 mg Intravenous Q6H PRN Robbie Lis, MD      . pantoprazole (PROTONIX) injection 40 mg  40 mg Intravenous Q12H Belkys A Regalado, MD   40 mg at 05/08/14 1226  . sodium chloride 0.9 % injection 3 mL  3 mL Intravenous Q12H Robbie Lis, MD   3 mL at 05/08/14 0915  . sodium chloride 0.9 % injection 3 mL  3 mL Intravenous Q12H Robbie Lis, MD   3 mL at 05/08/14 0911  . sodium chloride 0.9 % injection 3 mL  3 mL Intravenous PRN Robbie Lis,  MD        Allergies as of 05/07/2014 - Review Complete 05/07/2014  Allergen Reaction Noted  . Mango flavor Rash 05/07/2014    Family History  Problem Relation Age of Onset  . Diabetes      GM  . Coronary artery disease    . Hyperlipidemia    . Prostate cancer    . Colon cancer Neg Hx     History   Social History  . Marital Status: Divorced    Spouse Name: N/A    Number of Children: 1  . Years of Education: N/A   Occupational History  . Not on file.   Social History Main Topics  . Smoking status: Never Smoker   . Smokeless tobacco: Not on file  . Alcohol Use: No     Comment: Quit in 1988.  . Drug Use: No  . Sexual Activity: Not on file   Other Topics Concern  . Not on file   Social History Narrative   Single father   seperated (2008) but good relationship w/ wife   On disability   Daily caffeine use   Pt is on Liver Transplace @ [facility]: Ten point ROS is O/W negative except as mentioned in HPI.  Physical Exam: Vital signs in last 24 hours: Temp:  [97.5 F (36.4 C)-98.2 F (36.8 C)] 97.5 F (36.4 C) (11/13 1134) Pulse Rate:  [102-109] 109 (11/13 1134) Resp:  [20-24] 20 (11/13 1134) BP: (104-117)/(60-70) 104/62 mmHg (11/13 1134) SpO2:  [97 %-99 %] 97 % (11/13 1134) Weight:  [152 lb 1.6 oz (68.992 kg)] 152 lb 1.6 oz (68.992 kg) (11/13 0448) Last BM Date: 05/08/14 General:  Alert, chronically  ill-appearing, in NAD Head:  Normocephalic and atraumatic. Eyes:  Scleral icterus present. Ears:  Normal auditory acuity. Mouth:  No deformity or lesions.   Lungs:  Clear throughout to auscultation.  No wheezes, crackles, or rhonchi.  Heart:  Tachy.  No murmurs noted. Abdomen:  Soft, mild distended.  BS present.  Mild diffuse TTP without R/R/G.   Msk:  Symmetrical without gross deformities. Pulses:  Normal pulses noted. Extremities:  Edema noted in B/L LE's Neurologic:  Alert and oriented x4;  grossly normal neurologically. Skin:  Intact without significant lesions or rashes.  Intake/Output from previous day: 11/12 0701 - 11/13 0700 In: 790 [P.O.:840; I.V.:3] Out: -  Intake/Output this shift: Total I/O In: 120 [P.O.:120] Out: -   Lab Results:  Recent Labs  05/07/14 0454 05/08/14 0416  WBC 15.3* 11.9*  HGB 9.8* 8.8*  HCT 30.2* 27.5*  PLT 84* 62*   BMET  Recent Labs  05/07/14 0454 05/08/14 0416  NA 125* 124*  K 3.3* 4.0  CL 89* 90*  CO2 26 25  GLUCOSE 91 105*  BUN 12 12  CREATININE 0.74 0.77  CALCIUM 7.9* 7.5*   LFT  Recent Labs  05/08/14 0416  PROT 5.3*  ALBUMIN 1.6*  AST 59*  ALT 28  ALKPHOS 101  BILITOT 4.8*   PT/INR  Recent Labs  05/07/14 0454 05/08/14 1131  LABPROT 25.9* 28.0*  INR 2.35* 2.59*   Studies/Results: Dg Chest 2 View  05/07/2014   CLINICAL DATA:  Cough, shortness of breath, ascites. History of liver failure.  EXAM: CHEST  2 VIEW  COMPARISON:  PA and lateral chest 04/30/2014.  FINDINGS: Airspace disease throughout the right chest seen on the prior study has markedly improved. The  left lung appears clear. Heart size is normal. No pneumothorax or pleural effusion. Marked degenerative change right shoulder is noted.  IMPRESSION: Marked improvement in airspace disease in the right chest consistent with resolving pneumonia. No new abnormality.   Electronically Signed   By: Inge Rise M.D.   On: 05/07/2014 05:31   US  Paracentesis  05/07/2014   CLINICAL DATA:  Cirrhosis, hepatitis-C, recurrent ascites. Request for therapeutic paracentesis.  EXAM: ULTRASOUND GUIDED PARACENTESIS  COMPARISON:  Previous paracentesis  PROCEDURE: An ultrasound guided paracentesis was thoroughly discussed with the patient and questions answered. The benefits, risks, alternatives and complications were also discussed. The patient understands and wishes to proceed with the procedure. Written consent was obtained.  Ultrasound was performed to localize and mark an adequate pocket of fluid in the right lower quadrant of the abdomen. The area was then prepped and draped in the normal sterile fashion. 1% Lidocaine was used for local anesthesia. Under ultrasound guidance a 6 French Safe-T-Centesis catheter was introduced. Paracentesis was performed. The catheter was removed and a dressing applied.  COMPLICATIONS: None  FINDINGS: A total of approximately 3.8 L of clear yellow fluid was removed. A fluid sample was not sent for laboratory analysis.  IMPRESSION: Successful ultrasound guided paracentesis yielding 3.8 L of ascites.  Read by: Ascencion Dike PA-C   Electronically Signed   By: Corrie Mckusick D.O.   On: 05/07/2014 10:58    IMPRESSION:  -Decompensated cirrhosis secondary to ETOH and Hep C:  Under the care of Dr. Damita Lack with transplant at [facility].  This is complicated by new [place] that was recently embolized.  Also ascites, hyponatremia, HSE, thrombocytopenia, coagulopathy, and lower extremity edema. -Rectal bleeding:  History of colitis for which he was previously taking Colazol and Canasa suppositories, but has not taken them for a couple of years.  Bleeding may be related to colitis or hemorrhoids, diverticular disease, etc.  PLAN: -Patient's wife is asking for inpatient colonoscopy since he needs this done for his pre-transplant evaluation (she says that the transplant coordinator recommended this today as well).  He was scheduled for one within the  past week or so at [facility] but has been hospitalized here.  Patient's wife feels that it may be easier for Bradley [name] to do the prep while he is here as well with the help of the nursing staff due to difficulty with ambulation and mobilization recently. -Any major decisions regarding his liver care should be left to Dr. Damita Lack and the transplant team at [facility]. -? Possibility of transfer to [facility] if patient fails to improve enough to be discharged with outpatient follow-up at [facility] without being re-hospitalized within 48 hours.   ZEHR, JESSICA D.  05/08/2014, 12:44 PM  Pager number [phone]     ________________________________________________________________________  Velora Heckler GI MD note:  I personally examined the patient, reviewed the data and agree with the assessment and plan described above. Very nice man, gets his care at [place] for the past 8-10 years.  Undergoing evaluation for liver transplant.  Wife in contact with transplant coordinators.  He currently has MELD of 21, that is without the 'bonus points' for hepatoma.  Currently in hospital with really FTT, ascites, acute on chronic liver decompensation.  Issue we've been asked to see him for is minor hematochezia. He has h/o colitis Deatra Ina colonoscopy 2007) and that is probably the source now.  He needs colonoscopy to confirm, rule out other sources, also needs it as pre-transplant evaluation.  Reasonable to do this while  in [place]. Will plan on that for tomorrow AM, given INR elevation (2.6) will given Vit K and FFP transfusion but still it will likely be diagnostic only.     Owens Loffler, MD [facility] Pager [phone]

## 2014-05-09 NOTE — Op Note (Signed)
[facility] [place], [id number]   COLONOSCOPY PROCEDURE REPORT  PATIENT: [name], Bradley  MR#: [id number] BIRTHDATE: 1955/07/30 , 74  yrs. old GENDER: male ENDOSCOPIST: Carol Ada, MD REFERRED BY: PROCEDURE DATE:  2014/05/24 PROCEDURE:   Colonoscopy, diagnostic ASA CLASS:   Class III INDICATIONS: Hematochezia MEDICATIONS: Fentanyl 50 mcg IV and Versed 4 mg IV  DESCRIPTION OF PROCEDURE:   After the risks and benefits and of the procedure were explained, informed consent was obtained.  revealed no abnormalities of the rectum.    The Pentax Ped Colon [id number] endoscope was introduced through the anus and advanced to the cecum, which was identified by both the appendix and ileocecal valve .  The quality of the prep was good. .  The instrument was then slowly withdrawn as the colon was fully examined.     FINDINGS: There was no evidence of inflammation in the colon. Nothing to suggest a history of colitis.  No biopsies were obtained.  The mucosa was edematous which is secondary to his cirrhotic state.  No evidence of any masses or polyps. Retroflexed views revealed no abnormalities.     The scope was then withdrawn from the patient and the procedure completed.  WITHDRAWAL TIME:  COMPLICATIONS: There were no immediate complications.  ENDOSCOPIC IMPRESSION: 1) Essentially normal colonoscopy.  No evidence of colitis/proctitis.  RECOMMENDATIONS: 1) Continue transplatation work up at [facility]. 2) Continue supportive care.  REPEAT EXAM:  cc:  _______________________________ eSignedCarol Ada, MD 05/24/2014 8:47 AM   CPT CODES: ICD CODES:  The ICD and CPT codes recommended by this software are interpretations from the data that the clinical staff has captured with the software.  The verification of the translation of this report to the ICD and CPT codes and modifiers is the sole responsibility of the health care institution and  practicing physician where this report was generated.  [place]. will not be held responsible for the validity of the ICD and CPT codes included on this report.  AMA assumes no liability for data contained or not contained herein. CPT is a Designer, television/film set of the [employer].

## 2014-05-09 NOTE — Progress Notes (Signed)
[facility] PROGRESS NOTE  Bradley [name]:[id number] DOB: [birth year] DOA: 05/07/2014 PCP: [name], MD  Assessment/Plan: 58 year old with PMH significant for Cirrhosis secondary to hepatitis C, [place], who was admitted with abdominal pain, distension, developed in hospital hematochezia. He underwent paracentesis 11-13 yielding 3.8 l. He underwent colonoscopy 11-14 which was normal.    1-GI bleed: hematochezia.  Received 2 units FFP. Vitamin k 10 mg 11-13. Hb stable. Colonoscopy was normal.  GI consulted.  on Ceftriaxone for SBP prophylaxis.  On IV protonix.   2-Abdominal Pain: secondary to ascites. Improved after paracentesis. Paracentesis 11-13 yielding 3.8 L.  Continue Ceftriaxone SBP prophylaxis in setting GI bleed.  Resume diuretics.   3-Cirrhosis, secondary to hepatitis C, hepatocellular carcinoma;  Repeat Vitamin K dose today.  resume diuretics.  On transplant list at [facility].  Continue with lactulose.   4-Anemia; acute blood loss ; in setting GI bleed. Cycle Hb.  Transfusion as needed.  Hb stable.   5-Chronic Hyponatremia; in setting liver failure. Monitor.  6-Thrombocytopenia; in setting liver failure   Code Status: Full code.  Family Communication: Care discussed with wife.  Disposition Plan: Transfer to step down.    Consultants:  GI  Procedures:  none  Antibiotics:  Ceftriaxone; SBP prophylaxis.   HPI/Subjective: Feeling ok, still with abdominal pain.  He is sleepy, came from colonoscopy. He answer questions appropriately.    Objective: Filed Vitals:   05/09/14 1121  BP: 126/65  Pulse:   Temp:   Resp: 15    Intake/Output Summary (Last 24 hours) at 05/09/14 1213 Last data filed at 05/09/14 1137  Gross per 24 hour  Intake 1530.5 ml  Output      0 ml  Net 1530.5 ml   Filed Weights   05/07/14 0432 05/08/14 0448 05/09/14 0500  Weight: 72.122 kg (159 lb) 68.992 kg (152 lb 1.6 oz) 73 kg (160 lb 15 oz)    Exam:   General:   Alert in no distress.   Cardiovascular: S 1, S 2 RRR  Respiratory: CTA  Abdomen: Bs present, distended, no rigidity no guarding.   Musculoskeletal: no edema  Data Reviewed: Basic Metabolic Panel:  Recent Labs Lab 05/03/14 0947 05/04/14 0449 05/07/14 0454 05/07/14 0545 05/08/14 0416 05/09/14 0731  NA 123* 124* 125*  --  124* 128*  K 4.2 4.3 3.3*  --  4.0 4.3  CL 90* 87* 89*  --  90* 91*  CO2 _0 --  25 23  GLUCOSE 137* 112* 91  --  105* 92  BUN _1 --  12 11  CREATININE 0.71 0.67 0.74  --  0.77 0.78  CALCIUM 7.4* 7.8* 7.9*  --  7.5* 8.0*  MG  --   --   --  1.9  --   --   PHOS  --   --   --  2.2*  --   --    Liver Function Tests:  Recent Labs Lab 05/02/14 1516 05/07/14 0454 05/08/14 0416 05/09/14 0731  AST 60* 64* 59* 69*  ALT 38 32 28 32  ALKPHOS 109 112 101 98  BILITOT 3.8* 5.3* 4.8* 6.8*  PROT 5.1* 6.1 5.3* 5.8*  ALBUMIN 1.5* 1.9* 1.6* 2.0*    Recent Labs Lab 05/07/14 0454  LIPASE 105*   No results for input(s): AMMONIA in the last 168 hours. CBC:  Recent Labs Lab 05/03/14 0544 05/07/14 0454 05/08/14 0416 05/08/14 1851 05/09/14 0731  WBC 15.0* 15.3* 11.9*  --  14.1*  NEUTROABS  --  11.0*  --   --   --   HGB 9.6* 9.8* 8.8* 9.8* 9.4*  HCT 29.5* 30.2* 27.5* 30.2* 29.4*  MCV 86.8 86.5 89.0  --  88.6  PLT 68* 84* 62*  --  60*   Cardiac Enzymes: No results for input(s): CKTOTAL, CKMB, CKMBINDEX, TROPONINI in the last 168 hours. BNP (last 3 results) No results for input(s): PROBNP in the last 8760 hours. CBG:  Recent Labs Lab 05/07/14 0755 05/08/14 0734  GLUCAP 70 81    Recent Results (from the past 240 hour(s))  Culture, blood (routine x 2)     Status: None   Collection Time: 04/30/14 12:56 PM  Result Value Ref Range Status   Specimen Description BLOOD LEFT ARM  Final   Special Requests BOTTLES DRAWN AEROBIC AND ANAEROBIC 5 CC EACH  Final   Culture  Setup Time   Final    04/30/2014 17:08 Performed at [facility]    Culture   Final    NO GROWTH 5 DAYS Performed at [facility]    Report Status 05/06/2014 FINAL  Final  Culture, blood (routine x 2)     Status: None   Collection Time: 04/30/14 12:57 PM  Result Value Ref Range Status   Specimen Description BLOOD RIGHT HAND  Final   Special Requests BOTTLES DRAWN AEROBIC AND ANAEROBIC 5 CC  Final   Culture  Setup Time   Final    04/30/2014 17:08 Performed at [facility]   Final    NO GROWTH 5 DAYS Performed at [facility]    Report Status 05/06/2014 FINAL  Final  Body fluid culture     Status: None   Collection Time: 05/01/14  2:24 PM  Result Value Ref Range Status   Specimen Description PERITONEAL CAVITY  Final   Special Requests Normal  Final   Gram Stain   Final    NO WBC SEEN NO ORGANISMS SEEN Performed at [facility]    Culture   Final    NO GROWTH 3 DAYS Performed at [facility]    Report Status 05/05/2014 FINAL  Final  Culture, sputum-assessment     Status: None   Collection Time: 05/02/14 10:43 AM  Result Value Ref Range Status   Specimen Description SPUTUM  Final   Special Requests Normal  Final   Sputum evaluation   Final    THIS SPECIMEN IS ACCEPTABLE. RESPIRATORY CULTURE REPORT TO FOLLOW.   Report Status 05/02/2014 FINAL  Final  Culture, respiratory (NON-Expectorated)     Status: None   Collection Time: 05/02/14 10:43 AM  Result Value Ref Range Status   Specimen Description SPUTUM  Final   Special Requests NONE  Final   Gram Stain   Final    RARE WBC PRESENT, PREDOMINANTLY PMN FEW SQUAMOUS EPITHELIAL CELLS PRESENT FEW YEAST WITH PSEUDOHYPHAE Performed at [facility]    Culture   Final    MODERATE CANDIDA ALBICANS Performed at [facility]    Report Status 05/04/2014 FINAL  Final  MRSA PCR Screening     Status: None   Collection Time: 05/08/14  3:20 PM  Result Value Ref Range Status   MRSA by PCR NEGATIVE NEGATIVE Final     Comment:        The GeneXpert MRSA Assay (FDA approved for NASAL specimens only), is one component of a comprehensive MRSA colonization surveillance program.  It is not intended to diagnose MRSA infection nor to guide or monitor treatment for MRSA infections.      Studies: No results found.  Scheduled Meds: . antiseptic oral rinse  7 mL Mouth Rinse BID  . cefTRIAXone (ROCEPHIN)  IV  2 g Intravenous Q24H  . lactulose  20 g Oral TID  . mirtazapine  7.5 mg Oral QHS  . pantoprazole (PROTONIX) IV  40 mg Intravenous Q12H  . peg 3350 powder  0.5 kit Oral Once  . phytonadione  5 mg Oral Once  . sodium chloride  3 mL Intravenous Q12H  . sodium chloride  3 mL Intravenous Q12H   Continuous Infusions: . sodium chloride      Principal Problem:   Abdominal distension Active Problems:   HTN (hypertension)   Thrombocytopenia   Ascites   Coagulopathy   Severe protein-calorie malnutrition   Hyponatremia   Depression with anxiety   Decompensation of cirrhosis of liver   Hepatocellular carcinoma   SOB (shortness of breath)   Weakness generalized   Leukocytosis   Anemia of chronic disease    Time spent: 35 minutes.     Niel Hummer A  [facility] Pager [phone]. If 7PM-7AM, please contact night-coverage at [email or web address], password [facility] 05/09/2014, 12:13 PM  LOS: 2 days

## 2014-05-09 NOTE — Progress Notes (Signed)
Report called to [facility]. Pt. To transfer to room 1337 after lunch.

## 2014-05-09 NOTE — Plan of Care (Signed)
Problem: Phase II Progression Outcomes Goal: IV changed to normal saline lock Outcome: Completed/Met Date Met:  05/09/14     

## 2014-05-09 NOTE — Plan of Care (Signed)
Problem: Phase I Progression Outcomes Goal: OOB as tolerated unless otherwise ordered Outcome: Progressing Goal: Initial discharge plan identified Outcome: Progressing Goal: Voiding-avoid urinary catheter unless indicated Outcome: Completed/Met Date Met:  05/09/14

## 2014-05-09 NOTE — Progress Notes (Signed)
Transferred to [place] via wheelchair. No respiratory distress noted.

## 2014-05-09 NOTE — Interval H&P Note (Signed)
History and Physical Interval Note:  05/09/2014 8:06 AM  Bradley [name]  has presented today for surgery, with the diagnosis of Rectal bleeding; history of colitis  The various methods of treatment have been discussed with the patient and family. After consideration of risks, benefits and other options for treatment, the patient has consented to  Procedure(s): COLONOSCOPY (N/A) as a surgical intervention .  The patient's history has been reviewed, patient examined, no change in status, stable for surgery.  I have reviewed the patient's chart and labs.  Questions were answered to the patient's satisfaction.     Arnita Koons D

## 2014-05-10 DIAGNOSIS — D689 Coagulation defect, unspecified: Secondary | ICD-10-CM

## 2014-05-10 DIAGNOSIS — D638 Anemia in other chronic diseases classified elsewhere: Secondary | ICD-10-CM

## 2014-05-10 LAB — BASIC METABOLIC PANEL
Anion gap: 11 (ref 5–15)
BUN: 10 mg/dL (ref 6–23)
CALCIUM: 7.8 mg/dL — AB (ref 8.4–10.5)
CO2: 25 mEq/L (ref 19–32)
Chloride: 93 mEq/L — ABNORMAL LOW (ref 96–112)
Creatinine, Ser: 0.86 mg/dL (ref 0.50–1.35)
GFR calc Af Amer: >90 mL/min (ref >90)
GLUCOSE: 122 mg/dL — AB (ref 70–99)
Potassium: 3.8 mEq/L (ref 3.7–5.3)
Sodium: 129 mEq/L — ABNORMAL LOW (ref 137–147)

## 2014-05-10 LAB — GLUCOSE, CAPILLARY: Glucose-Capillary: 107 mg/dL — ABNORMAL HIGH (ref 70–99)

## 2014-05-10 LAB — PREPARE FRESH FROZEN PLASMA

## 2014-05-10 LAB — PHOSPHORUS: Phosphorus: 2.9 mg/dL (ref 2.3–4.6)

## 2014-05-10 MED ORDER — LORAZEPAM 0.5 MG PO TABS
0.5000 mg | ORAL_TABLET | Freq: Three times a day (TID) | ORAL | Status: DC | PRN
  Administered 2014-05-10 (×2): 0.5 mg via ORAL
  Filled 2014-05-10 (×2): qty 1

## 2014-05-10 NOTE — Progress Notes (Signed)
Patient ID: Arval Brandstetter, male   DOB: 09-06-1955, 58 y.o.   MRN: [id number] [facility] PROGRESS NOTE  Sneijder Bernards HCW:[id number] DOB: [birth year] DOA: 05/07/2014 PCP: [name], MD  Brief narrative: 58 year old with PMH significant for Cirrhosis secondary to hepatitis C, [place], who was admitted with abdominal pain, distension, developed in hospital hematochezia. He underwent paracentesis 11/13 yielding 3.8 l. He underwent colonoscopy 11/14 which was normal.    Assessment/Plan:    Principal Problem: Acute blood loss anemia / Lower GI bleed - status post colonoscopy which was normal. - Received 2 units FFP. Vitamin k 10 mg 11-13. - appreciate GI following  - hemoglobin stable at 9.4 - continue Ceftriaxone for SBP prophylaxis.  - continue PPI therapy, protonix 40 mg IV Q 12 hours   Active Problems: Abdominal Pain secondary to ascites - status post paracentesis 05/08/2014 with 3.8 L fluid removed - continue lasix 20 mg daily - continue to monitor if paracentesis needed in next 24 hours   Cirrhosis, secondary to hepatitis C, hepatocellular carcinoma - has received vitamin K - on transplant list [facility] - continue lasix, lactulose, spironolactone   Chronic Hyponatremia - in setting liver failure. Monitor.   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  Family Communication: updated the family at the bedside  Disposition Plan: home when stable    IV access:   Peripheral IV  Procedures and diagnostic studies:    Paracentesis 05/08/2014  Colonoscopy 05/09/2014  Medical Consultants:   Gastroenterology  Other Consultants:   None  IAnti-Infectives:    Ceftriaxone; SBP prophylaxis.   Leisa Lenz, MD  [facility] Pager [phone]  If 7PM-7AM, please contact night-coverage [email or web address] Password [email or web address] 05/10/2014, 2:41 PM   LOS: 3  days    HPI/Subjective: No acute overnight events.  Objective: Filed Vitals:   05/09/14 2100 05/10/14 0652 05/10/14 0701 05/10/14 1335  BP: 117/70 115/74  119/73  Pulse: 117 116  105  Temp: 97.7 F (36.5 C) 98.3 F (36.8 C)  98.3 F (36.8 C)  TempSrc: Axillary Oral  Oral  Resp: 16 14  16   Height:      Weight:   69.7 kg (153 lb 10.6 oz)   SpO2: 97% 90%  94%    Intake/Output Summary (Last 24 hours) at 05/10/14 1441 Last data filed at 05/10/14 1100  Gross per 24 hour  Intake    770 ml  Output   1150 ml  Net   -380 ml    Exam:   General:  Pt is alert, follows commands appropriately, not in acute distress  Cardiovascular: Regular rate and rhythm, S1/S2 appreciated   Respiratory: Clear to auscultation bilaterally, no wheezing  Abdomen:  distended, bowel sounds present  Extremities: +2 LE pitting edema, pulses DP and PT palpable bilaterally  Neuro: Grossly nonfocal  Data Reviewed: Basic Metabolic Panel:  Recent Labs Lab 05/07/14 0454 05/07/14 0545 05/08/14 0416 05/09/14 0731 05/09/14 1922 05/10/14 0440  NA 125*  --  124* 128* 126* 129*  K 3.3*  --  4.0 4.3 4.2 3.8  CL 89*  --  90* 91* 89* 93*  CO2 26  --  25 23 21 25   GLUCOSE 91  --  105* 92 192* 122*  BUN 12  --  12 11 11 10   CREATININE 0.74  --  0.77 0.78 0.86 0.86  CALCIUM 7.9*  --  7.5* 8.0* 7.8* 7.8*  MG  --  1.9  --   --   --   --   PHOS  --  2.2*  --   --   --  2.9   Liver Function Tests:  Recent Labs Lab 05/07/14 0454 05/08/14 0416 05/09/14 0731  AST 64* 59* 69*  ALT 32 28 32  ALKPHOS 112 101 98  BILITOT 5.3* 4.8* 6.8*  PROT 6.1 5.3* 5.8*  ALBUMIN 1.9* 1.6* 2.0*    Recent Labs Lab 05/07/14 0454  LIPASE 105*   No results for input(s): AMMONIA in the last 168 hours. CBC:  Recent Labs Lab 05/07/14 0454 05/08/14 0416 05/08/14 1851 05/09/14 0731  WBC 15.3* 11.9*  --  14.1*  NEUTROABS 11.0*  --   --   --   HGB 9.8* 8.8* 9.8* 9.4*  HCT 30.2* 27.5* 30.2* 29.4*  MCV 86.5 89.0   --  88.6  PLT 84* 62*  --  60*   Cardiac Enzymes: No results for input(s): CKTOTAL, CKMB, CKMBINDEX, TROPONINI in the last 168 hours. BNP: Invalid input(s): POCBNP CBG:  Recent Labs Lab 05/07/14 0755 05/08/14 0734 05/09/14 0924 05/10/14 0744  GLUCAP 70 81 90 107*    Recent Results (from the past 240 hour(s))  Body fluid culture     Status: None   Collection Time: 05/01/14  2:24 PM  Result Value Ref Range Status   Specimen Description PERITONEAL CAVITY  Final   Special Requests Normal  Final   Gram Stain   Final    NO WBC SEEN NO ORGANISMS SEEN Performed at [facility]    Culture   Final    NO GROWTH 3 DAYS Performed at [facility]    Report Status 05/05/2014 FINAL  Final  Culture, sputum-assessment     Status: None   Collection Time: 05/02/14 10:43 AM  Result Value Ref Range Status   Specimen Description SPUTUM  Final   Special Requests Normal  Final   Sputum evaluation   Final    THIS SPECIMEN IS ACCEPTABLE. RESPIRATORY CULTURE REPORT TO FOLLOW.   Report Status 05/02/2014 FINAL  Final  Culture, respiratory (NON-Expectorated)     Status: None   Collection Time: 05/02/14 10:43 AM  Result Value Ref Range Status   Specimen Description SPUTUM  Final   Special Requests NONE  Final   Gram Stain   Final    RARE WBC PRESENT, PREDOMINANTLY PMN FEW SQUAMOUS EPITHELIAL CELLS PRESENT FEW YEAST WITH PSEUDOHYPHAE Performed at [facility]    Culture   Final    MODERATE CANDIDA ALBICANS Performed at [facility]    Report Status 05/04/2014 FINAL  Final  MRSA PCR Screening     Status: None   Collection Time: 05/08/14  3:20 PM  Result Value Ref Range Status   MRSA by PCR NEGATIVE NEGATIVE Final    Comment:        The GeneXpert MRSA Assay (FDA approved for NASAL specimens only), is one component of a comprehensive MRSA colonization surveillance program. It is not intended to diagnose MRSA infection nor to guide or monitor  treatment for MRSA infections.      Scheduled Meds: . cefTRIAXone (ROCEPHIN)  IV  2 g Intravenous Q24H  . furosemide  20 mg Oral Daily  . lactulose  20 g Oral TID  . mirtazapine  7.5 mg Oral QHS  . pantoprazole (PROTONIX) IV  40 mg Intravenous Q12H  . spironolactone  50 mg  Oral Daily

## 2014-05-10 NOTE — Plan of Care (Signed)
Problem: Phase I Progression Outcomes Goal: Pain controlled with appropriate interventions Outcome: Progressing  Problem: Phase II Progression Outcomes Goal: Progress activity as tolerated unless otherwise ordered Outcome: Progressing

## 2014-05-11 ENCOUNTER — Encounter (HOSPITAL_COMMUNITY): Payer: Self-pay | Admitting: Gastroenterology

## 2014-05-11 ENCOUNTER — Inpatient Hospital Stay (HOSPITAL_COMMUNITY): Payer: Medicare Other

## 2014-05-11 LAB — GLUCOSE, CAPILLARY: Glucose-Capillary: 102 mg/dL — ABNORMAL HIGH (ref 70–99)

## 2014-05-11 MED ORDER — LORAZEPAM 0.5 MG PO TABS
0.5000 mg | ORAL_TABLET | Freq: Four times a day (QID) | ORAL | Status: DC | PRN
  Administered 2014-05-11 – 2014-05-12 (×2): 0.5 mg via ORAL
  Filled 2014-05-11 (×2): qty 1

## 2014-05-11 MED ORDER — CEFTRIAXONE SODIUM IN DEXTROSE 20 MG/ML IV SOLN
1.0000 g | INTRAVENOUS | Status: DC
  Administered 2014-05-11 – 2014-05-12 (×2): 1 g via INTRAVENOUS
  Filled 2014-05-11 (×3): qty 50

## 2014-05-11 MED ORDER — NADOLOL 20 MG PO TABS
20.0000 mg | ORAL_TABLET | Freq: Every day | ORAL | Status: DC
  Administered 2014-05-11 – 2014-05-13 (×2): 20 mg via ORAL
  Filled 2014-05-11 (×3): qty 1

## 2014-05-11 NOTE — Progress Notes (Signed)
Patient ID: Bradley Woods, male   DOB: 11-Jul-1955, 58 y.o.   MRN: [id number] [facility] PROGRESS NOTE  Bradley Woods CZY:[id number] DOB: 06-Mar-1956 DOA: 05/07/2014 PCP: [name], MD  Brief narrative: 58 year old with PMH significant for Cirrhosis secondary to hepatitis C, [place], who was admitted with abdominal pain, distension, developed in hospital hematochezia. He underwent paracentesis 11/13 yielding 3.8 l. He underwent colonoscopy 11/14 which was normal.    Assessment/Plan:    Principal Problem: Acute blood loss anemia / Lower GI bleed - status post colonoscopy which was normal. - Received 2 units FFP. Vitamin k 10 mg 11/13. - appreciate GI following  - hemoglobin stable at 9.4 - continue Ceftriaxone for SBP prophylaxis.  - continue PPI therapy, protonix 40 mg IV Q 12 hours   Active Problems: Abdominal Pain secondary to ascites - status post paracentesis 05/08/2014 with 3.8 L fluid removed - continue lasix 20 mg daily - attempted paracentesis today but per IR not enough fluid to be removed   Cirrhosis, secondary to hepatitis C, hepatocellular carcinoma - has received vitamin K - on transplant list at [facility] - continue lasix, lactulose, spironolactone  - added nadolol 20 mg daily which may help with tremulousness   Chronic Hyponatremia - in setting liver failure.  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  Family Communication: updated the family at the bedside  Disposition Plan: home when stable    IV access:   Peripheral IV  Procedures and diagnostic studies:   Paracentesis 05/08/2014 Colonoscopy 05/09/2014  Medical Consultants:   Gastroenterology   Other Consultants:   WOC  IAnti-Infectives:    Ceftriaxone; SBP prophylaxis.   Leisa Lenz, MD  [facility] Pager  [phone]  If 7PM-7AM, please contact night-coverage [email or web address] Password [email or web address] 05/11/2014, 2:56 PM   LOS: 4 days    HPI/Subjective: No acute overnight events.  Objective: Filed Vitals:   05/11/14 0500 05/11/14 0551 05/11/14 1025 05/11/14 1352  BP:  114/67 138/74 107/66  Pulse:  114  86  Temp:  98.2 F (36.8 C)  98.2 F (36.8 C)  TempSrc:  Oral  Oral  Resp:  16  16  Height:      Weight: 70.024 kg (154 lb 6 oz)     SpO2:  94%  92%    Intake/Output Summary (Last 24 hours) at 05/11/14 1456 Last data filed at 05/11/14 1003  Gross per 24 hour  Intake    963 ml  Output    800 ml  Net    163 ml    Exam:   General:  Pt is pale, jaundiced, looks very fatigued, tremulous   Cardiovascular: Regular rate and rhythm, S1/S2 appreciated   Respiratory: Clear to auscultation bilaterally, no wheezing, no crackles, no rhonchi  Abdomen:  distended, bowel sounds present  Extremities: +2 LE pitting edema, erythema and bruising over right foot and ankle, pulses DP and PT palpable bilaterally  Neuro: Grossly nonfocal  Data Reviewed: Basic Metabolic Panel:  Recent Labs Lab 05/07/14 0454 05/07/14 0545 05/08/14 0416 05/09/14 0731 05/09/14 1922 05/10/14 0440  NA 125*  --  124* 128* 126* 129*  K 3.3*  --  4.0 4.3 4.2 3.8  CL 89*  --  90* 91* 89* 93*  CO2 26  --  25 23 21 25   GLUCOSE 91  --  105* 92  192* 122*  BUN 12  --  12 11 11 10   CREATININE 0.74  --  0.77 0.78 0.86 0.86  CALCIUM 7.9*  --  7.5* 8.0* 7.8* 7.8*  MG  --  1.9  --   --   --   --   PHOS  --  2.2*  --   --   --  2.9   Liver Function Tests:  Recent Labs Lab 05/07/14 0454 05/08/14 0416 05/09/14 0731  AST 64* 59* 69*  ALT 32 28 32  ALKPHOS 112 101 98  BILITOT 5.3* 4.8* 6.8*  PROT 6.1 5.3* 5.8*  ALBUMIN 1.9* 1.6* 2.0*    Recent Labs Lab 05/07/14 0454  LIPASE 105*   No results for input(s): AMMONIA in the last 168 hours. CBC:  Recent Labs Lab 05/07/14 0454 05/08/14 0416 05/08/14 1851  05/09/14 0731  WBC 15.3* 11.9*  --  14.1*  NEUTROABS 11.0*  --   --   --   HGB 9.8* 8.8* 9.8* 9.4*  HCT 30.2* 27.5* 30.2* 29.4*  MCV 86.5 89.0  --  88.6  PLT 84* 62*  --  60*   Cardiac Enzymes: No results for input(s): CKTOTAL, CKMB, CKMBINDEX, TROPONINI in the last 168 hours. BNP: Invalid input(s): POCBNP CBG:  Recent Labs Lab 05/07/14 0755 05/08/14 0734 05/09/14 0924 05/10/14 0744 05/11/14 0744  GLUCAP 70 81 90 107* 102*    Recent Results (from the past 240 hour(s))  Culture, sputum-assessment     Status: None   Collection Time: 05/02/14 10:43 AM  Result Value Ref Range Status   Specimen Description SPUTUM  Final   Special Requests Normal  Final   Sputum evaluation   Final    THIS SPECIMEN IS ACCEPTABLE. RESPIRATORY CULTURE REPORT TO FOLLOW.   Report Status 05/02/2014 FINAL  Final  Culture, respiratory (NON-Expectorated)     Status: None   Collection Time: 05/02/14 10:43 AM  Result Value Ref Range Status   Specimen Description SPUTUM  Final   Special Requests NONE  Final   Gram Stain   Final    RARE WBC PRESENT, PREDOMINANTLY PMN FEW SQUAMOUS EPITHELIAL CELLS PRESENT FEW YEAST WITH PSEUDOHYPHAE Performed at [facility]    Culture   Final    MODERATE CANDIDA ALBICANS Performed at [facility]    Report Status 05/04/2014 FINAL  Final  MRSA PCR Screening     Status: None   Collection Time: 05/08/14  3:20 PM  Result Value Ref Range Status   MRSA by PCR NEGATIVE NEGATIVE Final    Comment:        The GeneXpert MRSA Assay (FDA approved for NASAL specimens only), is one component of a comprehensive MRSA colonization surveillance program. It is not intended to diagnose MRSA infection nor to guide or monitor treatment for MRSA infections.      Scheduled Meds: . antiseptic oral rinse  7 mL Mouth Rinse BID  . cefTRIAXone (ROCEPHIN)  IV  1 g Intravenous Q24H  . furosemide  20 mg Oral Daily  . lactulose  20 g Oral TID  . mirtazapine  7.5 mg  Oral QHS  . nadolol  20 mg Oral Daily  . pantoprazole (PROTONIX) IV  40 mg Intravenous Q12H  . sodium chloride  3 mL Intravenous Q12H  . sodium chloride  3 mL Intravenous Q12H  . spironolactone  50 mg Oral Daily   Continuous Infusions: . sodium chloride

## 2014-05-11 NOTE — Progress Notes (Signed)
RN called into room for reports of bleeding.  Pt noted to be in bed holding a tissue over L a/c.  Circular area 1.5cm by 1.5cm noted with top layer of skin missing.  Patient report he pulled of gauze and tape and took a layer of skin with it.  Site cleaned and allevyn dressing applied. Will continue to monitor.

## 2014-05-11 NOTE — Progress Notes (Signed)
Patient presented to [place] for therapeutic only paracentesis, upon reviewing images minimal amount of fluid seen with only percutaneous access perihepatic. Patient denies any significant abdominal distention and risks and benefits discussed with patient, procedure cancelled.   Tsosie Billing PA-C Interventional Radiology  05/11/14  10:29 AM

## 2014-05-11 NOTE — Consult Note (Signed)
WOC wound consult note Reason for Consult: Right foot, heel area with bruising.  Injury to this ankle reported two weeks ago, but has not injured it since.  Noted is the  significant medical history.The presentation is not consistent with suspected deep tissue injury. Edema is present.  There  Wound type: Trauma Pressure Ulcer POA: No Measurement:13cm x 20cm, no depth.  Area encompasses ankle, not heel. Bilateral heels are intact, without erythema and are blanchable. Wound bed:No open wound Drainage (amount, consistency, odor) none Periwound:intact, with ecchymosis Dressing procedure/placement/frequency: I will forego a dressing in favor of daily observation with cleansing and moisturizing.  We will place a pressure redistribution boot around the foot for protection-particularly in light of the edema and compromised blood flow. [facility] nursing team will not follow, but will remain available to this patient, the nursing and medical team.  Please re-consult if needed. Thanks, Maudie Flakes, MSN, RN, [place], [place], [place] [phone])

## 2014-05-12 LAB — BASIC METABOLIC PANEL
ANION GAP: 11 (ref 5–15)
BUN: 9 mg/dL (ref 6–23)
CO2: 24 meq/L (ref 19–32)
CREATININE: 0.68 mg/dL (ref 0.50–1.35)
Calcium: 7.6 mg/dL — ABNORMAL LOW (ref 8.4–10.5)
Chloride: 88 mEq/L — ABNORMAL LOW (ref 96–112)
GFR calc non Af Amer: >90 mL/min (ref >90)
Glucose, Bld: 128 mg/dL — ABNORMAL HIGH (ref 70–99)
Potassium: 3.2 mEq/L — ABNORMAL LOW (ref 3.7–5.3)
Sodium: 123 mEq/L — ABNORMAL LOW (ref 137–147)

## 2014-05-12 LAB — GLUCOSE, CAPILLARY: Glucose-Capillary: 145 mg/dL — ABNORMAL HIGH (ref 70–99)

## 2014-05-12 MED ORDER — PANTOPRAZOLE SODIUM 40 MG PO TBEC
40.0000 mg | DELAYED_RELEASE_TABLET | Freq: Two times a day (BID) | ORAL | Status: DC
  Administered 2014-05-13: 40 mg via ORAL
  Filled 2014-05-12 (×3): qty 1

## 2014-05-12 MED ORDER — LACTULOSE 10 GM/15ML PO SOLN
20.0000 g | Freq: Every day | ORAL | Status: DC
  Administered 2014-05-13: 20 g via ORAL
  Filled 2014-05-12: qty 30

## 2014-05-12 NOTE — Progress Notes (Signed)
Patient ID: Bradley Woods, male   DOB: 02/26/1956, 58 y.o.   MRN: [id number] [facility] PROGRESS NOTE  Bradley Woods [id number]:[id number] DOB: 1956/01/13 DOA: 05/07/2014 PCP: [name], MD  Brief narrative: 58 year old with PMH significant for Cirrhosis secondary to hepatitis C, [place], who was admitted with abdominal pain, distension, developed in hospital hematochezia. He underwent paracentesis 11/13 yielding 3.8 l. He underwent colonoscopy 11/14 which was normal.    Assessment/Plan:     Principal Problem: Acute blood loss anemia / Lower GI bleed - status post colonoscopy which was normal. - Received 2 units FFP. Vitamin k 10 mg 11/13. - appreciate GI following  - hemoglobin stable at 9.4 - continue Ceftriaxone for SBP prophylaxis.  - continue Protonix 40 mg twice daily, may change to by mouth regimen.  Active Problems: Abdominal Pain secondary to ascites - status post paracentesis 05/08/2014 with 3.8 L fluid removed - continue lasix 20 mg daily - attempted paracentesis 05/11/2014 but not enough fluid for drainage.  Cirrhosis, secondary to hepatitis C, hepatocellular carcinoma - has received vitamin K - on transplant list at [facility] - continue lasix, lactulose, spironolactone  - added nadolol 20 mg daily which helped with tremulousness  Chronic Hyponatremia - in setting liver failure.   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  Family Communication: updated the family at the bedside  Disposition Plan: home when stable likely in next 24 hours    IV access:   Peripheral IV  Procedures and diagnostic studies:   Paracentesis 05/08/2014 Colonoscopy 05/09/2014  Medical Consultants:   Gastroenterology   Other Consultants:   WOC  IAnti-Infectives:    Ceftriaxone; SBP prophylaxis.    Leisa Lenz, MD  [facility] Pager  [phone]  If 7PM-7AM, please contact night-coverage [email or web address] Password [email or web address] 05/12/2014, 11:11 AM   LOS: 5 days    HPI/Subjective: No acute overnight events.  Objective: Filed Vitals:   05/11/14 1352 05/11/14 2140 05/12/14 0613 05/12/14 1012  BP: 107/66 115/70 97/57 113/64  Pulse: 86 92 108 95  Temp: 98.2 F (36.8 C) 98.6 F (37 C) 98.7 F (37.1 C)   TempSrc: Oral Oral Oral   Resp: 16 18 16    Height:      Weight:      SpO2: 92% 92% 93%     Intake/Output Summary (Last 24 hours) at 05/12/14 1111 Last data filed at 05/12/14 0900  Gross per 24 hour  Intake    840 ml  Output      0 ml  Net    840 ml    Exam:   General:  Pt is alert, follows commands appropriately, not in acute distress  Cardiovascular: Regular rate and rhythm, S1/S2, no murmurs  Respiratory: Clear to auscultation bilaterally, no wheezing, no crackles, no rhonchi  Abdomen: daistended, bowel sounds present  Extremities: +2 LE pitting edema, bruising, erythema over right ankle and foot; pulses DP and PT palpable bilaterally  Neuro: Grossly nonfocal  Data Reviewed: Basic Metabolic Panel:  Recent Labs Lab 05/07/14 0454 05/07/14 0545 05/08/14 0416 05/09/14 0731 05/09/14 1922 05/10/14 0440  NA 125*  --  124* 128* 126* 129*  K 3.3*  --  4.0 4.3 4.2 3.8  CL 89*  --  90* 91* 89* 93*  CO2 26  --  25 23 21 25   GLUCOSE 91  --  105* 92 192* 122*  BUN 12  --  12 11  11 10  CREATININE 0.74  --  0.77 0.78 0.86 0.86  CALCIUM 7.9*  --  7.5* 8.0* 7.8* 7.8*  MG  --  1.9  --   --   --   --   PHOS  --  2.2*  --   --   --  2.9   Liver Function Tests:  Recent Labs Lab 05/07/14 0454 05/08/14 0416 05/09/14 0731  AST 64* 59* 69*  ALT 32 28 32  ALKPHOS 112 101 98  BILITOT 5.3* 4.8* 6.8*  PROT 6.1 5.3* 5.8*  ALBUMIN 1.9* 1.6* 2.0*    Recent Labs Lab 05/07/14 0454  LIPASE 105*   No results for input(s): AMMONIA in the last 168 hours. CBC:  Recent Labs Lab 05/07/14 0454 05/08/14 0416  05/08/14 1851 05/09/14 0731  WBC 15.3* 11.9*  --  14.1*  NEUTROABS 11.0*  --   --   --   HGB 9.8* 8.8* 9.8* 9.4*  HCT 30.2* 27.5* 30.2* 29.4*  MCV 86.5 89.0  --  88.6  PLT 84* 62*  --  60*   Cardiac Enzymes: No results for input(s): CKTOTAL, CKMB, CKMBINDEX, TROPONINI in the last 168 hours. BNP: Invalid input(s): POCBNP CBG:  Recent Labs Lab 05/08/14 0734 05/09/14 0924 05/10/14 0744 05/11/14 0744 05/12/14 0755  GLUCAP 81 90 107* 102* 145*    Recent Results (from the past 240 hour(s))  MRSA PCR Screening     Status: None   Collection Time: 05/08/14  3:20 PM  Result Value Ref Range Status   MRSA by PCR NEGATIVE NEGATIVE Final    Comment:        The GeneXpert MRSA Assay (FDA approved for NASAL specimens only), is one component of a comprehensive MRSA colonization surveillance program. It is not intended to diagnose MRSA infection nor to guide or monitor treatment for MRSA infections.      Scheduled Meds: . antiseptic oral rinse  7 mL Mouth Rinse BID  . cefTRIAXone (ROCEPHIN)  IV  1 g Intravenous Q24H  . furosemide  20 mg Oral Daily  . lactulose  20 g Oral TID  . mirtazapine  7.5 mg Oral QHS  . nadolol  20 mg Oral Daily  . pantoprazole (PROTONIX) IV  40 mg Intravenous Q12H  . sodium chloride  3 mL Intravenous Q12H  . sodium chloride  3 mL Intravenous Q12H  . spironolactone  50 mg Oral Daily   Continuous Infusions: . sodium chloride

## 2014-05-12 NOTE — Plan of Care (Signed)
Problem: Phase I Progression Outcomes Goal: Pain controlled with appropriate interventions Outcome: Progressing Goal: OOB as tolerated unless otherwise ordered Outcome: Completed/Met Date Met:  05/12/14 Goal: Initial discharge plan identified Outcome: Completed/Met Date Met:  05/12/14 Goal: Hemodynamically stable Outcome: Progressing

## 2014-05-12 NOTE — Plan of Care (Signed)
Problem: Phase I Progression Outcomes Goal: Pain controlled with appropriate interventions Outcome: Progressing Goal: OOB as tolerated unless otherwise ordered Outcome: Progressing Goal: Hemodynamically stable Outcome: Progressing  Problem: Phase II Progression Outcomes Goal: Progress activity as tolerated unless otherwise ordered Outcome: Progressing

## 2014-05-12 NOTE — Care Management Note (Signed)
CARE MANAGEMENT NOTE 05/12/2014  Patient:  LEVAUGHN, PUCCINELLI   Account Number:  [id number]  Date Initiated:  05/07/2014  Documentation initiated by:  Dessa Phi  Subjective/Objective Assessment:   44 [place] W/ABD [place] TRANSPLANT WAIT LIST @ [facility],HEP C.READMIT-11/5-11/9/15     Action/Plan:   FROM HOME.   Anticipated DC Date:  05/13/2014   Anticipated DC Plan:  [employer]  CM consult      Choice offered to / List presented to:          [facility] arranged  HH-1 RN      [place].   Status of service:  In process, will continue to follow Medicare Important Message given?   (If response is "NO", the following Medicare IM given date fields will be blank) Date Medicare IM given:   Medicare IM given by:   Date Additional Medicare IM given:   Additional Medicare IM given by:    Discharge Disposition:    Per UR Regulation:  Reviewed for med. necessity/level of care/duration of stay  If discussed at [place] of Stay Meetings, dates discussed:   05/12/2014    Comments:  05/12/14 Marney Doctor RN,BSN,NCM [phone] Medical advisor asked [facility] to follow pt when DC'd due to pt being high risk for readmission.  Will need HHRN orders at DC.  05/07/14 KATHY MAHABIR RN,BSN NCM [phone] NO ANTICIPATED D/C NEEDS.

## 2014-05-13 LAB — GLUCOSE, CAPILLARY: Glucose-Capillary: 112 mg/dL — ABNORMAL HIGH (ref 70–99)

## 2014-05-13 MED ORDER — POTASSIUM CHLORIDE ER 10 MEQ PO TBCR: 10.0000 meq | EXTENDED_RELEASE_TABLET | Freq: Every day | ORAL | Status: AC

## 2014-05-13 MED ORDER — FUROSEMIDE 20 MG PO TABS: 20.0000 mg | ORAL_TABLET | Freq: Every day | ORAL | Status: AC

## 2014-05-13 MED ORDER — CETYLPYRIDINIUM CHLORIDE 0.05 % MT LIQD: 7.0000 mL | Freq: Two times a day (BID) | OROMUCOSAL | Status: AC

## 2014-05-13 MED ORDER — HYDROMORPHONE HCL 2 MG PO TABS: 1.0000 mg | ORAL_TABLET | ORAL | Status: DC | PRN

## 2014-05-13 MED ORDER — NADOLOL 20 MG PO TABS: 20.0000 mg | ORAL_TABLET | Freq: Every day | ORAL | Status: AC

## 2014-05-13 MED ORDER — PANTOPRAZOLE SODIUM 40 MG PO TBEC: 40.0000 mg | DELAYED_RELEASE_TABLET | Freq: Two times a day (BID) | ORAL | Status: AC

## 2014-05-13 MED ORDER — SPIRONOLACTONE 50 MG PO TABS: 50.0000 mg | ORAL_TABLET | Freq: Every day | ORAL | Status: DC

## 2014-05-13 MED ORDER — LACTULOSE 10 GM/15ML PO SOLN: 20.0000 g | Freq: Every day | ORAL | Status: AC

## 2014-05-13 MED ORDER — CIPROFLOXACIN HCL 500 MG PO TABS: 500.0000 mg | ORAL_TABLET | Freq: Two times a day (BID) | ORAL | Status: DC

## 2014-05-13 MED ORDER — ALBUTEROL SULFATE HFA 108 (90 BASE) MCG/ACT IN AERS: 2.0000 | INHALATION_SPRAY | Freq: Four times a day (QID) | RESPIRATORY_TRACT | Status: AC | PRN

## 2014-05-13 MED ORDER — LORAZEPAM 0.5 MG PO TABS: 0.5000 mg | ORAL_TABLET | Freq: Three times a day (TID) | ORAL | Status: DC | PRN

## 2014-05-13 MED ORDER — ONDANSETRON HCL 4 MG PO TABS: 4.0000 mg | ORAL_TABLET | Freq: Four times a day (QID) | ORAL | Status: AC | PRN

## 2014-05-13 NOTE — Plan of Care (Signed)
Problem: Phase I Progression Outcomes Goal: Pain controlled with appropriate interventions Outcome: Completed/Met Date Met:  05/13/14 Goal: Hemodynamically stable Outcome: Adequate for Discharge Goal: Other Phase I Outcomes/Goals Outcome: Not Applicable Date Met:  90/24/09  Problem: Phase II Progression Outcomes Goal: Progress activity as tolerated unless otherwise ordered Outcome: Adequate for Discharge Goal: Discharge plan established Outcome: Completed/Met Date Met:  05/13/14 Goal: Vital signs remain stable Outcome: Completed/Met Date Met:  05/13/14 Goal: Obtain order to discontinue catheter if appropriate Outcome: Not Applicable Date Met:  73/53/29 Goal: Other Phase II Outcomes/Goals Outcome: Not Applicable Date Met:  92/42/68  Problem: Phase III Progression Outcomes Goal: Pain controlled on oral analgesia Outcome: Completed/Met Date Met:  05/13/14 Goal: Activity at appropriate level-compared to baseline (UP IN CHAIR FOR HEMODIALYSIS)  Outcome: Completed/Met Date Met:  05/13/14 Goal: Voiding independently Outcome: Completed/Met Date Met:  05/13/14 Goal: IV/normal saline lock discontinued Outcome: Completed/Met Date Met:  05/13/14 Goal: Foley discontinued Outcome: Not Applicable Date Met:  34/19/62 Goal: Discharge plan remains appropriate-arrangements made Outcome: Completed/Met Date Met:  05/13/14 Goal: Other Phase III Outcomes/Goals Outcome: Not Applicable Date Met:  22/97/98  Problem: Discharge Progression Outcomes Goal: Discharge plan in place and appropriate Outcome: Completed/Met Date Met:  05/13/14 Goal: Pain controlled with appropriate interventions Outcome: Completed/Met Date Met:  05/13/14 Goal: Hemodynamically stable Outcome: Completed/Met Date Met:  92/11/94 Goal: Complications resolved/controlled Outcome: Adequate for Discharge Goal: Tolerating diet Outcome: Completed/Met Date Met:  05/13/14 Goal: Activity appropriate for discharge plan Outcome:  Completed/Met Date Met:  05/13/14 Goal: Other Discharge Outcomes/Goals Outcome: Not Applicable Date Met:  17/40/81

## 2014-05-13 NOTE — Discharge Instructions (Addendum)
Take ciprofloxacin for 10 days on discharge for spontaneous bacterial prophylaxis.    Cirrhosis Cirrhosis is a condition of scarring of the liver which is caused when the liver has tried repairing itself following damage. This damage may come from a previous infection such as one of the forms of hepatitis (usually hepatitis C), or the damage may come from being injured by toxins. The main toxin that causes this damage is alcohol. The scarring of the liver from use of alcohol is irreversible. That means the liver cannot return to normal even though alcohol is not used any more. The main danger of hepatitis C infection is that it may cause long-lasting (chronic) liver disease, and this also may lead to cirrhosis. This complication is progressive and irreversible. CAUSES  Prior to available blood tests, hepatitis C could be contracted by blood transfusions. Since testing of blood has improved, this is now unlikely. This infection can also be contracted through intravenous drug use and the sharing of needles. It can also be contracted through sexual relationships. The injury caused by alcohol comes from too much use. It is not a few drinks that poison the liver, but years of misuse. Usually there will be some signs and symptoms early with scarring of the liver that suggest the development of better habits. Alcohol should never be used while using acetaminophen. A small dose of both taken together may cause irreversible damage to the liver. HOME CARE INSTRUCTIONS  There is no specific treatment for cirrhosis. However, there are things you can do to avoid making the condition worse.  Rest as needed.  Eat a well-balanced diet. Your caregiver can help you with suggestions.  Vitamin supplements including vitamins A, K, D, and thiamine can help.  A low-salt diet, water restriction, or diuretic medicine may be needed to reduce fluid retention.  Avoid alcohol. This can be extremely toxic if combined with  acetaminophen.  Avoid drugs which are toxic to the liver. Some of these include isoniazid, methyldopa, acetaminophen, anabolic steroids (muscle-building drugs), erythromycin, and oral contraceptives (birth control pills). Check with your caregiver to make sure medicines you are presently taking will not be harmful.  Periodic blood tests may be required. Follow your caregiver's advice regarding the timing of these.  Milk thistle is an herbal remedy which does protect the liver against toxins. However, it will not help once the liver has been scarred. SEEK MEDICAL CARE IF:  You have increasing fatigue or weakness.  You develop swelling of the hands, feet, legs, or face.  You vomit bright red blood, or a coffee ground appearing material.  You have blood in your stools, or the stools turn black and tarry.  You have a fever.  You develop loss of appetite, or have nausea and vomiting.  You develop jaundice.  You develop easy bruising or bleeding.  You have worsening of any of the problems you are concerned about. Document Released: 06/12/2005 Document Revised: 09/04/2011 Document Reviewed: 01/29/2008 [facility] Patient Information 2015 [place], [place]. This information is not intended to replace advice given to you by your health care provider. Make sure you discuss any questions you have with your health care provider.

## 2014-05-13 NOTE — Care Management Note (Signed)
CARE MANAGEMENT NOTE 05/13/2014  Patient:  Bradley Woods, Bradley Woods   Account Number:  [id number]  Date Initiated:  05/07/2014  Documentation initiated by:  Dessa Phi  Subjective/Objective Assessment:   [address] [place] W/ABD Eleanor TRANSPLANT WAIT LIST @ [facility],HEP C.READMIT-11/5-11/9/15     Action/Plan:   FROM HOME.   Anticipated DC Date:  05/13/2014   Anticipated DC Plan:  [place]  CM consult      Choice offered to / List presented to:             Status of service:  In process, will continue to follow Medicare Important Message given?  YES (If response is "NO", the following Medicare IM given date fields will be blank) Date Medicare IM given:  05/13/2014 Medicare IM given by:  Marney Doctor Date Additional Medicare IM given:   Additional Medicare IM given by:    Discharge Disposition:    Per UR Regulation:  Reviewed for med. necessity/level of care/duration of stay  If discussed at [place] of Stay Meetings, dates discussed:   05/12/2014    Comments:  05/13/14 Marney Doctor RN,BSN,NCM MD spoke with pt about having a HHRN follow him at home to help keep him out of the hospital.  Pt refuses to have [facility] follow him. Pts plan is to follow up at [facility] very soon.  Pt appears very upset this morning and when I attempted to speak to him as well he did not appear interested in what I had to say about his needs at home.  [facility] alerted that their services would not be needed at this time.  05/12/14 Marney Doctor RN,BSN,NCM [phone] Medical advisor asked [facility] to follow pt when DC'd due to pt being high risk for readmission.  Will need HHRN orders at DC.  05/07/14 KATHY MAHABIR RN,BSN NCM [phone] NO ANTICIPATED D/C NEEDS.

## 2014-05-13 NOTE — Discharge Summary (Signed)
Physician Discharge Summary  Bradley Woods BPZ:[id number] DOB: Dec 18, 1955 DOA: 05/07/2014  PCP: [name], MD  Admit date: 05/07/2014 Discharge date: 05/13/2014  Recommendations for Outpatient Follow-up:  1. The following change has been made to your medications: take Lasix 20 mg daily, spironolactone 50 milligrams daily, Protonix 40 mg twice daily. Please note we added nadolol 20 mg daily. 2. You may take Ativan every 8 hours as needed for tremors, anxiety.  Discharge Diagnoses:  Principal Problem:   Abdominal distension Active Problems:   Ascites   HTN (hypertension)   Thrombocytopenia   Coagulopathy   Severe protein-calorie malnutrition   Hyponatremia   Depression with anxiety   Decompensation of cirrhosis of liver   Hepatocellular carcinoma   SOB (shortness of breath)   Weakness generalized   Leukocytosis   Anemia of chronic disease   GI bleed    Discharge Condition: stable   Diet recommendation: as tolerated   History of present illness:  58 year old with PMH significant for Cirrhosis secondary to hepatitis C, HCC, who was admitted with abdominal pain, distension, developed in hospital hematochezia. He underwent paracentesis 11/13 yielding 3.8 l. He underwent colonoscopy 11/14 which was normal.   Assessment/Plan:     Principal Problem: Acute blood loss anemia / Lower GI bleed - status post colonoscopy which was normal. - Received 2 units FFP. Vitamin k 10 mg 11/13. - hemoglobin stable at 9.4 - continue Ceftriaxone for SBP prophylaxis while in hospital. Cipro for 10 days on discharge. - continue Protonix 40 mg twice daily.  Active Problems: Abdominal Pain secondary to ascites - status post paracentesis 05/08/2014 with 3.8 L fluid removed - continue lasix 20 mg daily; prescriptions provided for potassium supplementation as well, 10 meq daily. - attempted paracentesis 05/11/2014 but not enough fluid for drainage.  Cirrhosis, secondary to hepatitis C,  hepatocellular carcinoma - has received vitamin K - on transplant list at [facility] - continue lasix, lactulose, spironolactone  - added nadolol 20 mg daily which helped with tremulousness  Chronic Hyponatremia - in setting liver failure.   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  Family Communication: updated the family at the bedside daily     IV access:   Peripheral IV  Procedures and diagnostic studies:   Paracentesis 05/08/2014 Colonoscopy 05/09/2014  Medical Consultants:   Gastroenterology   Other Consultants:   WOC  IAnti-Infectives:    Ceftriaxone; SBP prophylaxis. Changed to Cipro on discharge    Signed:  Leisa Lenz, MD  [facility] 05/13/2014, 10:43 AM  Pager #: [phone]   Discharge Exam: Filed Vitals:   05/13/14 0540  BP: 123/66  Pulse: 112  Temp: 97.9 F (36.6 C)  Resp: 16   Filed Vitals:   05/12/14 0613 05/12/14 1012 05/12/14 1350 05/13/14 0540  BP: 97/57 113/64 105/65 123/66  Pulse: 108 95 107 112  Temp: 98.7 F (37.1 C)  98.5 F (36.9 C) 97.9 F (36.6 C)  TempSrc: Oral  Oral Oral  Resp: 16  18 16   Height:      Weight:      SpO2: 93%  93% 92%    General: Pt is alert, follows commands appropriately, not in acute distress Cardiovascular: Regular rate and rhythm, S1/S2 appreciated  Respiratory: no wheezing, no crackles, no rhonchi Abdominal:  distended, bowel sounds +, no guarding Extremities: +2 LE pitting edema, bruising over right ankle / foot, pulses palpable  Neuro: Grossly nonfocal  Discharge Instructions  Discharge Instructions    Call MD for:  difficulty breathing, headache or visual disturbances    Complete by:  As directed      Call MD for:  persistant dizziness or light-headedness    Complete by:  As directed      Call MD for:  persistant nausea and vomiting    Complete by:  As directed       Call MD for:  severe uncontrolled pain    Complete by:  As directed      Diet - low sodium heart healthy    Complete by:  As directed      Discharge instructions    Complete by:  As directed   1. The following change has been made to your medications: take Lasix 20 mg daily, spironolactone 50 milligrams daily, Protonix 40 mg twice daily. Please note we added nadolol 20 mg daily. 2. You may take Ativan every 8 hours as needed for tremors, anxiety.     Increase activity slowly    Complete by:  As directed             Medication List    TAKE these medications        antiseptic oral rinse 0.05 % Liqd solution  Commonly known as:  CPC / CETYLPYRIDINIUM CHLORIDE 0.05%  7 mLs by Mouth Rinse route 2 (two) times daily.     ciprofloxacin 500 MG tablet  Commonly known as:  CIPRO  Take 1 tablet (500 mg total) by mouth 2 (two) times daily.     furosemide 20 MG tablet  Commonly known as:  LASIX  Take 1 tablet (20 mg total) by mouth daily.     HYDROmorphone 2 MG tablet  Commonly known as:  DILAUDID  Take 0.5-1 tablets (1-2 mg total) by mouth every 3 (three) hours as needed for severe pain.     lactulose 10 GM/15ML solution  Commonly known as:  CHRONULAC  Take 30 mLs (20 g total) by mouth daily.     LORazepam 0.5 MG tablet  Commonly known as:  ATIVAN  Take 1 tablet (0.5 mg total) by mouth every 8 (eight) hours as needed for anxiety (tremors).     mirtazapine 7.5 MG tablet  Commonly known as:  REMERON  Take 7.5 mg by mouth at bedtime.     nadolol 20 MG tablet  Commonly known as:  CORGARD  Take 1 tablet (20 mg total) by mouth daily.     ondansetron 4 MG tablet  Commonly known as:  ZOFRAN  Take 1 tablet (4 mg total) by mouth every 6 (six) hours as needed for nausea.     pantoprazole 40 MG tablet  Commonly known as:  PROTONIX  Take 1 tablet (40 mg total) by mouth 2 (two) times daily.     potassium chloride 10 MEQ tablet  Commonly known as:  K-DUR  Take 1 tablet (10 mEq  total) by mouth daily.     spironolactone 50 MG tablet  Commonly known as:  ALDACTONE  Take 1 tablet (50 mg total) by mouth daily.            Follow-up Information    Follow up with [name], MD. Schedule an appointment as soon as possible for a visit in 2 weeks.   Specialty:  Internal Medicine   Why:  As needed, Follow up appt after recent hospitalization   Contact information:   [place]  [place] [address] [phone]        The results of significant diagnostics from this hospitalization (including imaging, microbiology, ancillary and laboratory) are listed below for reference.    Significant Diagnostic Studies: Dg Chest 2 View  05/07/2014   CLINICAL DATA:  Cough, shortness of breath, ascites. History of liver failure.  EXAM: CHEST  2 VIEW  COMPARISON:  PA and lateral chest 04/30/2014.  FINDINGS: Airspace disease throughout the right chest seen on the prior study has markedly improved. The left lung appears clear. Heart size is normal. No pneumothorax or pleural effusion. Marked degenerative change right shoulder is noted.  IMPRESSION: Marked improvement in airspace disease in the right chest consistent with resolving pneumonia. No new abnormality.   Electronically Signed   By: Inge Rise M.D.   On: 05/07/2014 05:31   Dg Chest 2 View  04/30/2014   CLINICAL DATA:  Liver failure. Now with shortness of breath and distended abdomen  EXAM: CHEST  2 VIEW  COMPARISON:  8/28/9  FINDINGS: Normal heart size. There is diffuse airspace opacification throughout the right lung. This is new from previous exam. Left lung is clear. No pleural effusions identified. 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  IMPRESSION: 1. Asymmetric opacification of the right lung, new from previous exam. This may represent multifocal pneumonia or asymmetric edema.   Electronically Signed   By: Kerby Moors M.D.   On: 04/30/2014 11:46   US Abdomen  Limited  05/11/2014   CLINICAL DATA:  Ascites.  EXAM: LIMITED ABDOMEN ULTRASOUND FOR ASCITES  TECHNIQUE: Limited ultrasound survey for ascites was performed in all four abdominal quadrants.  COMPARISON:  None.  FINDINGS: Fluid is seen in all 4 quadrants of the abdomen.  IMPRESSION: Moderate ascites.   Electronically Signed   By: Lorin Picket M.D.   On: 05/11/2014 12:28   US Paracentesis  05/07/2014   CLINICAL DATA:  Cirrhosis, hepatitis-C, recurrent ascites. Request for therapeutic paracentesis.  EXAM: ULTRASOUND GUIDED PARACENTESIS  COMPARISON:  Previous paracentesis  PROCEDURE: An ultrasound guided paracentesis was thoroughly discussed with the patient and questions answered. The benefits, risks, alternatives and complications were also discussed. The patient understands and wishes to proceed with the procedure. Written consent was obtained.  Ultrasound was performed to localize and mark an adequate pocket of fluid in the right lower quadrant of the abdomen. The area was then prepped and draped in the normal sterile fashion. 1% Lidocaine was used for local anesthesia. Under ultrasound guidance a 6 French Safe-T-Centesis catheter was introduced. Paracentesis was performed. The catheter was removed and a dressing applied.  COMPLICATIONS: None  FINDINGS: A total of approximately 3.8 L of clear yellow fluid was removed. A fluid sample was not sent for laboratory analysis.  IMPRESSION: Successful ultrasound guided paracentesis yielding 3.8 L of ascites.  Read by: Ascencion Dike PA-C   Electronically Signed   By: Corrie Mckusick D.O.   On: 05/07/2014 10:58   US Paracentesis  05/04/2014   INDICATION: Cirrhosis, hepatitis-C, recurrent ascites. Request is made for therapeutic paracentesis .  EXAM: ULTRASOUND-GUIDED THERAPEUTIC PARACENTESIS  COMPARISON:  PRIOR PARACENTESIS ON 05/01/2014  MEDICATIONS: None.  COMPLICATIONS: None immediate  TECHNIQUE: Informed written consent was obtained from the patient after a  discussion of the risks, benefits and alternatives to treatment. A timeout was performed prior to the initiation of the procedure.  Initial ultrasound scanning demonstrates a small to moderate amount of ascites within the right lower abdominal quadrant. The right lower abdomen was prepped  and draped in the usual sterile fashion. 1% lidocaine was used for local anesthesia. Under direct ultrasound guidance, a 19 gauge, 7-cm, Yueh catheter was introduced. An ultrasound image was saved for documentation purposed. The paracentesis was performed. The catheter was removed and a dressing was applied. The patient tolerated the procedure well without immediate post procedural complication.  FINDINGS: A total of approximately 2.8 liters of slightly turbid, yellow fluid was removed.  IMPRESSION: Successful ultrasound-guided therapeutic paracentesis yielding 2.8 liters of peritoneal fluid.  Read by: Rowe Robert, PA-C   Electronically Signed   By: Jacqulynn Cadet M.D.   On: 05/04/2014 14:04   US Paracentesis  05/01/2014   INDICATION: Cirrhosis, hepatitis-C, recurrent ascites. Request is made for diagnostic and therapeutic paracentesis up to 5 liters.  EXAM: ULTRASOUND-GUIDED DIAGNOSTIC AND THERAPEUTIC PARACENTESIS  COMPARISON:  PRIOR PARACENTESIS ON 01/19/2014  MEDICATIONS: None.  COMPLICATIONS: None immediate  TECHNIQUE: Informed written consent was obtained from the patient after a discussion of the risks, benefits and alternatives to treatment. A timeout was performed prior to the initiation of the procedure.  Initial ultrasound scanning demonstrates a large amount of ascites within the right lower abdominal quadrant. The right lower abdomen was prepped and draped in the usual sterile fashion. 1% lidocaine was used for local anesthesia. Under direct ultrasound guidance, a 19 gauge, 7-cm, Yueh catheter was introduced. An ultrasound image was saved for documentation purposed. The paracentesis was performed. The catheter  was removed and a dressing was applied. The patient tolerated the procedure well without immediate post procedural complication.  FINDINGS: A total of approximately 5 liters liters of slightly turbid, yellow fluid was removed. Samples were sent to the laboratory as requested by the clinical team.  IMPRESSION: Successful ultrasound-guided diagnostic and therapeutic paracentesis yielding 5 liters of peritoneal fluid.  Read by: Rowe Robert, PA-C   Electronically Signed   By: Aletta Edouard M.D.   On: 05/01/2014 14:34   Dg Foot Complete Right  05/11/2014   CLINICAL DATA:  A traumatic right foot swelling and heel bruising  EXAM: RIGHT FOOT COMPLETE - 3+ VIEW  COMPARISON:  None.  FINDINGS: The bones of the right foot are adequately mineralized. There is no acute fracture nor dislocation. There is soft tissue swelling over the midfoot. There are arterial calcifications along the dorsum of the foot. There is irregularity of the density of the skin over the posterior aspect of the calcaneus.  IMPRESSION: There is no acute fracture, dislocation, nor evidence of significant degenerative change. There is diffuse soft tissue swelling over the midfoot with the above-mentioned soft tissue changes over the heel. Are there clinical findings of cellulitis?   Electronically Signed   By: David  Martinique   On: 05/11/2014 12:03    Microbiology: Recent Results (from the past 240 hour(s))  MRSA PCR Screening     Status: None   Collection Time: 05/08/14  3:20 PM  Result Value Ref Range Status   MRSA by PCR NEGATIVE NEGATIVE Final    Comment:        The GeneXpert MRSA Assay (FDA approved for NASAL specimens only), is one component of a comprehensive MRSA colonization surveillance program. It is not intended to diagnose MRSA infection nor to guide or monitor treatment for MRSA infections.      Labs: Basic Metabolic Panel:  Recent Labs Lab 05/07/14 0545 05/08/14 0416 05/09/14 0731 05/09/14 1922 05/10/14 0440  05/12/14 1209  NA  --  124* 128* 126* 129* 123*  K  --  4.0 4.3 4.2 3.8 3.2*  CL  --  90* 91* 89* 93* 88*  CO2  --  25 23 21 25 24   GLUCOSE  --  105* 92 192* 122* 128*  BUN  --  12 11 11 10 9   CREATININE  --  0.77 0.78 0.86 0.86 0.68  CALCIUM  --  7.5* 8.0* 7.8* 7.8* 7.6*  MG 1.9  --   --   --   --   --   PHOS 2.2*  --   --   --  2.9  --    Liver Function Tests:  Recent Labs Lab 05/07/14 0454 05/08/14 0416 05/09/14 0731  AST 64* 59* 69*  ALT 32 28 32  ALKPHOS 112 101 98  BILITOT 5.3* 4.8* 6.8*  PROT 6.1 5.3* 5.8*  ALBUMIN 1.9* 1.6* 2.0*    Recent Labs Lab 05/07/14 0454  LIPASE 105*   No results for input(s): AMMONIA in the last 168 hours. CBC:  Recent Labs Lab 05/07/14 0454 05/08/14 0416 05/08/14 1851 05/09/14 0731  WBC 15.3* 11.9*  --  14.1*  NEUTROABS 11.0*  --   --   --   HGB 9.8* 8.8* 9.8* 9.4*  HCT 30.2* 27.5* 30.2* 29.4*  MCV 86.5 89.0  --  88.6  PLT 84* 62*  --  60*   Cardiac Enzymes: No results for input(s): CKTOTAL, CKMB, CKMBINDEX, TROPONINI in the last 168 hours. BNP: BNP (last 3 results) No results for input(s): PROBNP in the last 8760 hours. CBG:  Recent Labs Lab 05/09/14 0924 05/10/14 0744 05/11/14 0744 05/12/14 0755 05/13/14 0759  GLUCAP 90 107* 102* 145* 112*    Time coordinating discharge: Over 30 minutes

## 2014-05-13 NOTE — Progress Notes (Signed)
Discharge instructions reviewed with patient and his spouse using teach back method and they demonstrated understanding.  Patient with occasional memory impairment so spouse signed the discharge instructions.  Patient stable for discharge home.  Assessment unchanged from this am.  Zandra Abts [facility]  05/13/2014

## 2014-05-14 ENCOUNTER — Telehealth: Payer: Self-pay | Admitting: Internal Medicine

## 2014-05-14 NOTE — Telephone Encounter (Signed)
Please call pt, needs a hospital  f/u within 2 weeks

## 2014-05-15 NOTE — Telephone Encounter (Signed)
Admit date: 05/07/2014 Discharge date: 05/13/2014  Reason for admission:  Abdominal distension/Ascites  Called to schedule appt and complete TCM call. No answer.  Left a message for call back on mobile number.

## 2014-05-18 NOTE — Telephone Encounter (Signed)
Left a message for call back.  

## 2014-05-23 ENCOUNTER — Inpatient Hospital Stay (HOSPITAL_COMMUNITY)
Admission: EM | Admit: 2014-05-23 | Discharge: 2014-05-26 | DRG: 871 | Disposition: A | Discharge status: Home Health | Payer: Medicare Other | Attending: Internal Medicine | Admitting: Internal Medicine

## 2014-05-23 ENCOUNTER — Encounter (HOSPITAL_COMMUNITY): Payer: Self-pay | Admitting: Oncology

## 2014-05-23 ENCOUNTER — Emergency Department (HOSPITAL_COMMUNITY): Payer: Medicare Other

## 2014-05-23 DIAGNOSIS — K7460 Unspecified cirrhosis of liver: Secondary | ICD-10-CM | POA: Diagnosis present

## 2014-05-23 DIAGNOSIS — I10 Essential (primary) hypertension: Secondary | ICD-10-CM | POA: Diagnosis present

## 2014-05-23 DIAGNOSIS — R140 Abdominal distension (gaseous): Secondary | ICD-10-CM | POA: Diagnosis present

## 2014-05-23 DIAGNOSIS — Z91018 Allergy to other foods: Secondary | ICD-10-CM | POA: Diagnosis not present

## 2014-05-23 DIAGNOSIS — D72829 Elevated white blood cell count, unspecified: Secondary | ICD-10-CM | POA: Diagnosis present

## 2014-05-23 DIAGNOSIS — E875 Hyperkalemia: Secondary | ICD-10-CM | POA: Diagnosis present

## 2014-05-23 DIAGNOSIS — J309 Allergic rhinitis, unspecified: Secondary | ICD-10-CM | POA: Diagnosis present

## 2014-05-23 DIAGNOSIS — D696 Thrombocytopenia, unspecified: Secondary | ICD-10-CM | POA: Diagnosis present

## 2014-05-23 DIAGNOSIS — D638 Anemia in other chronic diseases classified elsewhere: Secondary | ICD-10-CM | POA: Diagnosis present

## 2014-05-23 DIAGNOSIS — R188 Other ascites: Secondary | ICD-10-CM

## 2014-05-23 DIAGNOSIS — E871 Hypo-osmolality and hyponatremia: Secondary | ICD-10-CM

## 2014-05-23 DIAGNOSIS — F1020 Alcohol dependence, uncomplicated: Secondary | ICD-10-CM | POA: Diagnosis present

## 2014-05-23 DIAGNOSIS — K7210 Chronic hepatic failure without coma: Secondary | ICD-10-CM | POA: Diagnosis present

## 2014-05-23 DIAGNOSIS — I959 Hypotension, unspecified: Secondary | ICD-10-CM | POA: Diagnosis present

## 2014-05-23 DIAGNOSIS — Z79899 Other long term (current) drug therapy: Secondary | ICD-10-CM | POA: Diagnosis not present

## 2014-05-23 DIAGNOSIS — R7989 Other specified abnormal findings of blood chemistry: Secondary | ICD-10-CM | POA: Diagnosis present

## 2014-05-23 DIAGNOSIS — R0600 Dyspnea, unspecified: Secondary | ICD-10-CM

## 2014-05-23 DIAGNOSIS — R0602 Shortness of breath: Secondary | ICD-10-CM

## 2014-05-23 DIAGNOSIS — K7290 Hepatic failure, unspecified without coma: Secondary | ICD-10-CM | POA: Diagnosis present

## 2014-05-23 DIAGNOSIS — C220 Liver cell carcinoma: Secondary | ICD-10-CM | POA: Diagnosis present

## 2014-05-23 DIAGNOSIS — E43 Unspecified severe protein-calorie malnutrition: Secondary | ICD-10-CM | POA: Diagnosis present

## 2014-05-23 DIAGNOSIS — Z6823 Body mass index (BMI) 23.0-23.9, adult: Secondary | ICD-10-CM

## 2014-05-23 DIAGNOSIS — K859 Acute pancreatitis, unspecified: Secondary | ICD-10-CM | POA: Diagnosis present

## 2014-05-23 DIAGNOSIS — A419 Sepsis, unspecified organism: Secondary | ICD-10-CM | POA: Diagnosis present

## 2014-05-23 DIAGNOSIS — D689 Coagulation defect, unspecified: Secondary | ICD-10-CM | POA: Diagnosis present

## 2014-05-23 DIAGNOSIS — R531 Weakness: Secondary | ICD-10-CM | POA: Diagnosis present

## 2014-05-23 DIAGNOSIS — B1920 Unspecified viral hepatitis C without hepatic coma: Secondary | ICD-10-CM | POA: Diagnosis present

## 2014-05-23 DIAGNOSIS — K7031 Alcoholic cirrhosis of liver with ascites: Secondary | ICD-10-CM | POA: Diagnosis present

## 2014-05-23 DIAGNOSIS — M7989 Other specified soft tissue disorders: Secondary | ICD-10-CM | POA: Diagnosis present

## 2014-05-23 LAB — CBC WITH DIFFERENTIAL/PLATELET
BASOS ABS: 0 10*3/uL (ref 0.0–0.1)
BASOS ABS: 0 10*3/uL (ref 0.0–0.1)
Basophils Relative: 0 % (ref 0–1)
Basophils Relative: 0 % (ref 0–1)
EOS ABS: 0.5 10*3/uL (ref 0.0–0.7)
EOS PCT: 3 % (ref 0–5)
Eosinophils Absolute: 0.4 10*3/uL (ref 0.0–0.7)
Eosinophils Relative: 3 % (ref 0–5)
HCT: 31 % — ABNORMAL LOW (ref 39.0–52.0)
HEMATOCRIT: 26.7 % — AB (ref 39.0–52.0)
HEMOGLOBIN: 9.8 g/dL — AB (ref 13.0–17.0)
Hemoglobin: 8.3 g/dL — ABNORMAL LOW (ref 13.0–17.0)
LYMPHS ABS: 1.4 10*3/uL (ref 0.7–4.0)
LYMPHS PCT: 9 % — AB (ref 12–46)
Lymphocytes Relative: 7 % — ABNORMAL LOW (ref 12–46)
Lymphs Abs: 1.1 10*3/uL (ref 0.7–4.0)
MCH: 28.9 pg (ref 26.0–34.0)
MCH: 27.9 pg (ref 26.0–34.0)
MCHC: 31.6 g/dL (ref 30.0–36.0)
MCHC: 31.1 g/dL (ref 30.0–36.0)
MCV: 91.4 fL (ref 78.0–100.0)
MCV: 89.6 fL (ref 78.0–100.0)
MONO ABS: 1.5 10*3/uL — AB (ref 0.1–1.0)
MONOS PCT: 7 % (ref 3–12)
MONOS PCT: 9 % (ref 3–12)
Monocytes Absolute: 1.1 10*3/uL — ABNORMAL HIGH (ref 0.1–1.0)
NEUTROS ABS: 12.6 10*3/uL — AB (ref 1.7–7.7)
NEUTROS PCT: 83 % — AB (ref 43–77)
Neutro Abs: 13.2 10*3/uL — ABNORMAL HIGH (ref 1.7–7.7)
Neutrophils Relative %: 79 % — ABNORMAL HIGH (ref 43–77)
Platelets: 58 10*3/uL — ABNORMAL LOW (ref 150–400)
Platelets: 52 10*3/uL — ABNORMAL LOW (ref 150–400)
RBC: 3.39 MIL/uL — ABNORMAL LOW (ref 4.22–5.81)
RBC: 2.98 MIL/uL — AB (ref 4.22–5.81)
RDW: 18.9 % — ABNORMAL HIGH (ref 11.5–15.5)
RDW: 18.9 % — AB (ref 11.5–15.5)
WBC: 15.9 10*3/uL — AB (ref 4.0–10.5)
WBC: 15.9 10*3/uL — ABNORMAL HIGH (ref 4.0–10.5)

## 2014-05-23 LAB — LIPASE, BLOOD: Lipase: 399 U/L — ABNORMAL HIGH (ref 11–59)

## 2014-05-23 LAB — COMPREHENSIVE METABOLIC PANEL
ALBUMIN: 1.9 g/dL — AB (ref 3.5–5.2)
ALK PHOS: 164 U/L — AB (ref 39–117)
ALK PHOS: 148 U/L — AB (ref 39–117)
ALT: 45 U/L (ref 0–53)
ALT: 39 U/L (ref 0–53)
AST: 74 U/L — ABNORMAL HIGH (ref 0–37)
AST: 63 U/L — ABNORMAL HIGH (ref 0–37)
Albumin: 1.7 g/dL — ABNORMAL LOW (ref 3.5–5.2)
Anion gap: 12 (ref 5–15)
Anion gap: 9 (ref 5–15)
BILIRUBIN TOTAL: 4.2 mg/dL — AB (ref 0.3–1.2)
BUN: 25 mg/dL — ABNORMAL HIGH (ref 6–23)
BUN: 24 mg/dL — ABNORMAL HIGH (ref 6–23)
CHLORIDE: 88 meq/L — AB (ref 96–112)
CO2: 20 mEq/L (ref 19–32)
CO2: 21 mEq/L (ref 19–32)
Calcium: 8.3 mg/dL — ABNORMAL LOW (ref 8.4–10.5)
Calcium: 7.7 mg/dL — ABNORMAL LOW (ref 8.4–10.5)
Chloride: 88 mEq/L — ABNORMAL LOW (ref 96–112)
Creatinine, Ser: 0.94 mg/dL (ref 0.50–1.35)
Creatinine, Ser: 0.84 mg/dL (ref 0.50–1.35)
GFR calc Af Amer: >90 mL/min (ref >90)
GFR calc non Af Amer: >90 mL/min (ref >90)
GFR calc non Af Amer: >90 mL/min (ref >90)
GLUCOSE: 110 mg/dL — AB (ref 70–99)
Glucose, Bld: 132 mg/dL — ABNORMAL HIGH (ref 70–99)
POTASSIUM: 5.1 meq/L (ref 3.7–5.3)
Potassium: 5.4 mEq/L — ABNORMAL HIGH (ref 3.7–5.3)
SODIUM: 120 meq/L — AB (ref 137–147)
Sodium: 118 mEq/L — CL (ref 137–147)
TOTAL PROTEIN: 6.9 g/dL (ref 6.0–8.3)
Total Bilirubin: 3.4 mg/dL — ABNORMAL HIGH (ref 0.3–1.2)
Total Protein: 6 g/dL (ref 6.0–8.3)

## 2014-05-23 LAB — I-STAT TROPONIN, ED: TROPONIN I, POC: 0 ng/mL (ref 0.00–0.08)

## 2014-05-23 LAB — APTT: aPTT: 43 seconds — ABNORMAL HIGH (ref 24–37)

## 2014-05-23 LAB — SAMPLE TO BLOOD BANK

## 2014-05-23 LAB — PROTIME-INR
INR: 2.25 — AB (ref 0.00–1.49)
INR: 2.31 — AB (ref 0.00–1.49)
PROTHROMBIN TIME: 25.1 s — AB (ref 11.6–15.2)
Prothrombin Time: 25.6 seconds — ABNORMAL HIGH (ref 11.6–15.2)

## 2014-05-23 LAB — MAGNESIUM: Magnesium: 1.9 mg/dL (ref 1.5–2.5)

## 2014-05-23 LAB — GLUCOSE, CAPILLARY: Glucose-Capillary: 107 mg/dL — ABNORMAL HIGH (ref 70–99)

## 2014-05-23 LAB — PHOSPHORUS: Phosphorus: 3.2 mg/dL (ref 2.3–4.6)

## 2014-05-23 LAB — TSH: TSH: 3.58 u[IU]/mL (ref 0.350–4.500)

## 2014-05-23 LAB — I-STAT CG4 LACTIC ACID, ED: Lactic Acid, Venous: 4.16 mmol/L — ABNORMAL HIGH (ref 0.5–2.2)

## 2014-05-23 LAB — PRO B NATRIURETIC PEPTIDE: Pro B Natriuretic peptide (BNP): 112.8 pg/mL (ref 0–125)

## 2014-05-23 MED ORDER — PANTOPRAZOLE SODIUM 40 MG PO TBEC
40.0000 mg | DELAYED_RELEASE_TABLET | Freq: Two times a day (BID) | ORAL | Status: DC
  Administered 2014-05-23 – 2014-05-26 (×7): 40 mg via ORAL
  Filled 2014-05-23 (×9): qty 1

## 2014-05-23 MED ORDER — CIPROFLOXACIN HCL 500 MG PO TABS
500.0000 mg | ORAL_TABLET | Freq: Two times a day (BID) | ORAL | Status: DC
  Administered 2014-05-23: 500 mg via ORAL
  Filled 2014-05-23 (×3): qty 1

## 2014-05-23 MED ORDER — HYDROMORPHONE HCL 1 MG/ML IJ SOLN
1.0000 mg | INTRAMUSCULAR | Status: DC | PRN
  Administered 2014-05-23 – 2014-05-26 (×17): 1 mg via INTRAVENOUS
  Filled 2014-05-23 (×17): qty 1

## 2014-05-23 MED ORDER — LACTULOSE 10 GM/15ML PO SOLN
20.0000 g | Freq: Every day | ORAL | Status: DC
  Administered 2014-05-23 – 2014-05-26 (×4): 20 g via ORAL
  Filled 2014-05-23 (×4): qty 30

## 2014-05-23 MED ORDER — SODIUM CHLORIDE 0.9 % IJ SOLN
3.0000 mL | Freq: Two times a day (BID) | INTRAMUSCULAR | Status: DC
  Administered 2014-05-24 – 2014-05-26 (×4): 3 mL via INTRAVENOUS

## 2014-05-23 MED ORDER — ONDANSETRON HCL 4 MG/2ML IJ SOLN: 4.0000 mg | Freq: Three times a day (TID) | INTRAMUSCULAR | Status: DC | PRN

## 2014-05-23 MED ORDER — SODIUM CHLORIDE 0.9 % IV SOLN
Freq: Once | INTRAVENOUS | Status: AC
  Administered 2014-05-23: 07:00:00 via INTRAVENOUS

## 2014-05-23 MED ORDER — MIRTAZAPINE 7.5 MG PO TABS
7.5000 mg | ORAL_TABLET | Freq: Every day | ORAL | Status: DC
  Administered 2014-05-23 – 2014-05-25 (×3): 7.5 mg via ORAL
  Filled 2014-05-23 (×4): qty 1

## 2014-05-23 MED ORDER — SODIUM CHLORIDE 0.9 % IV SOLN: INTRAVENOUS | Status: DC

## 2014-05-23 MED ORDER — ONDANSETRON HCL 4 MG/2ML IJ SOLN: 4.0000 mg | Freq: Four times a day (QID) | INTRAMUSCULAR | Status: DC | PRN

## 2014-05-23 MED ORDER — ACETAMINOPHEN 650 MG RE SUPP: 650.0000 mg | Freq: Four times a day (QID) | RECTAL | Status: DC | PRN

## 2014-05-23 MED ORDER — CEFTRIAXONE SODIUM IN DEXTROSE 20 MG/ML IV SOLN
1.0000 g | INTRAVENOUS | Status: DC
  Administered 2014-05-23 – 2014-05-25 (×3): 1 g via INTRAVENOUS
  Filled 2014-05-23 (×4): qty 50

## 2014-05-23 MED ORDER — SPIRONOLACTONE 50 MG PO TABS: 50.0000 mg | ORAL_TABLET | Freq: Every day | ORAL | Status: DC

## 2014-05-23 MED ORDER — FUROSEMIDE 20 MG PO TABS
20.0000 mg | ORAL_TABLET | Freq: Every day | ORAL | Status: DC
  Administered 2014-05-23 – 2014-05-26 (×4): 20 mg via ORAL
  Filled 2014-05-23 (×4): qty 1

## 2014-05-23 MED ORDER — ACETAMINOPHEN 325 MG PO TABS: 650.0000 mg | ORAL_TABLET | Freq: Four times a day (QID) | ORAL | Status: DC | PRN

## 2014-05-23 MED ORDER — SODIUM CHLORIDE 0.9 % IV SOLN: 250.0000 mL | INTRAVENOUS | Status: DC | PRN

## 2014-05-23 MED ORDER — LORAZEPAM 0.5 MG PO TABS: 0.5000 mg | ORAL_TABLET | Freq: Three times a day (TID) | ORAL | Status: DC | PRN

## 2014-05-23 MED ORDER — NADOLOL 20 MG PO TABS
20.0000 mg | ORAL_TABLET | Freq: Every day | ORAL | Status: DC
Start: 2014-05-23 — End: 2014-05-26
  Administered 2014-05-23 – 2014-05-26 (×4): 20 mg via ORAL
  Filled 2014-05-23 (×5): qty 1

## 2014-05-23 MED ORDER — SODIUM CHLORIDE 0.9 % IJ SOLN: 3.0000 mL | INTRAMUSCULAR | Status: DC | PRN

## 2014-05-23 MED ORDER — ONDANSETRON HCL 4 MG PO TABS: 4.0000 mg | ORAL_TABLET | Freq: Four times a day (QID) | ORAL | Status: DC | PRN

## 2014-05-23 NOTE — Progress Notes (Signed)
CRITICAL VALUE ALERT  Critical value received:  Na 118   Date of notification:  05/23/2014   Time of notification:1054  Critical value read back:yes  Nurse who received alert:  Jabier Gauss   MD notified (1st page): A. Charlies Silvers  Time of first page: 1059  MD notified (2nd page):  Time of second page:  Responding MD:  A. Charlies Silvers  Time MD responded: [phone]

## 2014-05-23 NOTE — Progress Notes (Signed)
Pt arrived to room 1345 via stretcher from ED. Pt is alert and oriented. Oriented pt to room and unit. Call bell is within reach will continue to monitor.

## 2014-05-23 NOTE — ED Provider Notes (Signed)
CSN: [id number]     Arrival date & time 05/23/14  9935 History   First MD Initiated Contact with Patient 05/23/14 0455     Chief Complaint  Patient presents with  . Shortness of Breath    (Consider location/radiation/quality/duration/timing/severity/associated sxs/prior Treatment) HPI Comments: Patient is a 58 year old male with a history of hepatitis C, alcohol abuse, ulcerative colitis, thrombocytopenia, and ascites who presents to the emergency department for shortness of breath. Patient states that symptoms have been worsening over the past few days. He states that symptoms have no modifying factors and that he has noticed an increase in his abdominal distention. Patient states that he last underwent paracentesis during his previous hospital admission. He states he has gained approximately 10 pounds since this time. Patient complaining of diffuse abdominal discomfort, also without modifying factors. He states he has noticed associated weakness, similar to his past admission. He has had sporadic hematochezia x 1 week. He denies fever, chest pain, vomiting, nausea, melena, and syncope. He is followed by a Liver Transplant Specialist at [facility].  Patient is a 58 y.o. male presenting with shortness of breath. The history is provided by the patient. No language interpreter was used.  Shortness of Breath Associated symptoms: no chest pain, no fever and no vomiting     Past Medical History  Diagnosis Date  . Hepatitis C   . Anxiety     dx w/ depression 07/2008  . Colitis   . Alcohol abuse, in remission   . Headache(784.0)   . Allergic rhinitis   . Hepatic failure due to alcoholism   . Ulcerative colitis   . Coagulopathy 01/21/2014  . Severe protein-calorie malnutrition 01/21/2014  . Depression with anxiety 04/30/2014  . Ascites 01/21/2014  . ALLERGIC RHINITIS 04/28/2008    Qualifier: Diagnosis of  By: Larose Kells MD, [place] 04/04/2007    Annotation: cscope 08-2005 (had diarrhea)  Qualifier: Diagnosis of  By: Larose Kells MD, [place] HYPERSOMNIA, ASSOCIATED WITH SLEEP APNEA 09/20/2009    Qualifier: Diagnosis of  By: Elsworth Soho MD, [place], HX OF 06/15/2009    Qualifier: Diagnosis of  By: Inda Castle FNP, [place]   . HTN (hypertension) 09/26/2010  . Liver failure 01/15/2014  . Hepatic cirrhosis due to chronic hepatitis C infection 01/15/2014  . Anemia 01/19/2014  . Thrombocytopenia 01/19/2014  . Encephalopathy, hepatic 01/21/2014  . Hyponatremia 04/30/2014  . Hypoalbuminemia 04/30/2014   Past Surgical History  Procedure Laterality Date  . Tonsillectomy    . Paracentesis    . Colonoscopy N/A 05/09/2014    Procedure: COLONOSCOPY;  Surgeon: Beryle Beams, MD;  Location: [facility] ENDOSCOPY;  Service: Endoscopy;  Laterality: N/A;   Family History  Problem Relation Age of Onset  . Diabetes      GM  . Coronary artery disease    . Hyperlipidemia    . Prostate cancer    . Colon cancer Neg Hx    History  Substance Use Topics  . Smoking status: Never Smoker   . Smokeless tobacco: Not on file  . Alcohol Use: No     Comment: Quit in 1988.    Review of Systems  Constitutional: Negative for fever.  Respiratory: Positive for shortness of breath.   Cardiovascular: Negative for chest pain.  Gastrointestinal: Positive for blood in stool. Negative for nausea and vomiting.  Neurological: Positive for weakness (diffuse).  All other systems reviewed and are negative.  Allergies  Mango flavor  Home Medications   Prior to Admission medications   Medication Sig Start Date End Date Taking? Authorizing Provider  albuterol (PROVENTIL HFA;VENTOLIN HFA) 108 (90 BASE) MCG/ACT inhaler Inhale 2 puffs into the lungs every 6 (six) hours as needed for wheezing or shortness of breath. 05/13/14   Robbie Lis, MD  antiseptic oral rinse (CPC / CETYLPYRIDINIUM CHLORIDE 0.05%) 0.05 % LIQD solution 7 mLs by Mouth Rinse route 2 (two) times daily. 05/13/14   Robbie Lis,  MD  ciprofloxacin (CIPRO) 500 MG tablet Take 1 tablet (500 mg total) by mouth 2 (two) times daily. 05/13/14   Robbie Lis, MD  furosemide (LASIX) 20 MG tablet Take 1 tablet (20 mg total) by mouth daily. 05/13/14   Robbie Lis, MD  HYDROmorphone (DILAUDID) 2 MG tablet Take 0.5-1 tablets (1-2 mg total) by mouth every 3 (three) hours as needed for severe pain. 05/13/14   Robbie Lis, MD  lactulose (CHRONULAC) 10 GM/15ML solution Take 30 mLs (20 g total) by mouth daily. 05/13/14   Robbie Lis, MD  LORazepam (ATIVAN) 0.5 MG tablet Take 1 tablet (0.5 mg total) by mouth every 8 (eight) hours as needed for anxiety (tremors). 05/13/14   Robbie Lis, MD  mirtazapine (REMERON) 7.5 MG tablet Take 7.5 mg by mouth at bedtime.     Historical Provider, MD  nadolol (CORGARD) 20 MG tablet Take 1 tablet (20 mg total) by mouth daily. 05/13/14   Robbie Lis, MD  ondansetron (ZOFRAN) 4 MG tablet Take 1 tablet (4 mg total) by mouth every 6 (six) hours as needed for nausea. 05/13/14   Robbie Lis, MD  pantoprazole (PROTONIX) 40 MG tablet Take 1 tablet (40 mg total) by mouth 2 (two) times daily. 05/13/14   Robbie Lis, MD  potassium chloride (K-DUR) 10 MEQ tablet Take 1 tablet (10 mEq total) by mouth daily. 05/13/14   Robbie Lis, MD  spironolactone (ALDACTONE) 50 MG tablet Take 1 tablet (50 mg total) by mouth daily. 05/13/14   Robbie Lis, MD   BP 112/70 mmHg  Pulse 113  Temp(Src) 97.8 F (36.6 C) (Oral)  Resp 24  Ht 5\' 9"  (1.753 m)  Wt 165 lb (74.844 kg)  BMI 24.36 kg/m2  SpO2 99%   Physical Exam  Constitutional: He is oriented to person, place, and time. He appears well-developed and well-nourished. No distress.  Patient is chronically ill appearing; appears older than stated age.  HENT:  Head: Normocephalic and atraumatic.  Mouth/Throat: No oropharyngeal exudate.  Eyes: Conjunctivae and EOM are normal.  Neck: Normal range of motion.  Cardiovascular: Regular rhythm and intact distal  pulses.  Tachycardia present.   Pulmonary/Chest: Effort normal. No respiratory distress. He has no wheezes. He has no rales.  Dyspnea without tachypnea. Lungs clear b/l. No retractions or accessory muscle use.  Abdominal: He exhibits distension. He exhibits no mass. There is tenderness. There is no guarding.  Tense distended abdomen with diffuse TTP. No focal TTP or masses. No rebound or involuntary guarding.  Musculoskeletal: Normal range of motion.  Neurological: He is alert and oriented to person, place, and time. He exhibits normal muscle tone. Coordination normal.  GCS 15. Speech is goal oriented. Patient moves extremities without ataxia.  Skin: Skin is warm and dry. No rash noted. He is not diaphoretic. No erythema. No pallor.  Patient jaundiced appearing  Psychiatric: He has a normal mood and affect. His behavior is normal.  Nursing note and vitals reviewed.   ED Course  Procedures (including critical care time) Labs Review Labs Reviewed  I-STAT CG4 LACTIC ACID, ED - Abnormal; Notable for the following:    Lactic Acid, Venous 4.16 (*)    All other components within normal limits  CBC WITH DIFFERENTIAL  COMPREHENSIVE METABOLIC PANEL  LIPASE, BLOOD  PRO B NATRIURETIC PEPTIDE  PROTIME-INR  I-STAT TROPOININ, ED    Imaging Review No results found.   EKG Interpretation None      MDM   Final diagnoses:  Shortness of breath    58 year old male with a complicated past medical history including alcohol cirrhosis and hepatitis presents to the Emergency Department with complaints of shortness of breath. No chest pain. He endorses an approximate 10 pound weight gain since discharge as well as worsening abdominal distention. He has complaints of abdominal discomfort which is diffuse and without modifying factors. He also notes sporadic hematochezia without melena.  Patient appears dyspneic without tachypnea on exam. Lungs are clear bilaterally. He has diffuse abdominal  tenderness to palpation as well as a distended abdomen. No focal tenderness or peritoneal signs. Patient appears chronically ill and fatigued. He appears older than stated age.  Labs initiated with CBC, CMP, lipase, BNP, troponin, PT/INR, and lactate. Lactate has resulted which is elevated at 4.16. Will also obtain chest x-ray and EKG. Suspect that symptoms today are secondary to worsening ascites. Patient will likely require admission for further monitoring and paracentesis. Patient signed out to Dr. Sabra Heck who will follow-up on labs and disposition appropriately.   Filed Vitals:   05/23/14 0429 05/23/14 0439  BP:  112/70  Pulse:  113  Temp:  97.8 F (36.6 C)  TempSrc:  Oral  Resp:  24  Height:  5\' 9"  (1.753 m)  Weight:  165 lb (74.844 kg)  SpO2: 100% 99%       Antonietta Breach, PA-C 05/23/14 2010  Johnna Acosta, MD 05/23/14 [phone]

## 2014-05-23 NOTE — ED Notes (Signed)
Pt has a hx of Hep C, cirrhosis and UC.  Pt presents d/t increase in SOB.  Per EMS pt's abdomen is distended and tender in all quadrants.

## 2014-05-23 NOTE — ED Notes (Signed)
I have just given phone report to Brinkley, RN on [address]; will transport shortly.  He is awake, alert and in no distress.  He is oriented x 4 with clear speech.

## 2014-05-23 NOTE — ED Notes (Signed)
Per pt he is seeing streaks of bright red blood in his feces.  Pt also c/o swelling to b/l legs worse on right.

## 2014-05-23 NOTE — ED Notes (Signed)
Critical Sodium 120, kari ward,RN and Dr. Sabra Heck notified

## 2014-05-23 NOTE — ED Notes (Signed)
Bed: WA14 Expected date:  Expected time:  Means of arrival:  Comments: EMS 

## 2014-05-23 NOTE — ED Notes (Signed)
Not enough blood for Protime-INR, called floor phleb and they will draw.

## 2014-05-23 NOTE — H&P (Addendum)
[facility] History and Physical  Bradley Woods IRS:[id number] DOB: [birth year] DOA: 05/23/2014  Referring physician: ER physician PCP: [name], MD   Chief Complaint: abdominal distention, LE swelling   HPI:  58 year old male with past medical history of liver cirrhosis secondary to hepatitis C, alcohol abuse, HCC, on transplant list at [facility], recent hospitalization for lower GI bleed who presented to [facility] ED  With worsening abdominal swelling lower extremity swelling and more shortness of breath over past few days prior to this admission. No reports of nausea or vomiting. He has chronic abdominal pain and distention and has required paracentesis previously, most recently on prior admission. No reports of fevers or chills. He was on cipro for SBP prophylaxis at home which was from previous hospitalization on discharge. No reports of blood in stool or urine. No chest pain, palpitations.  In ED, BP was 106/62; HR 113, RR 18-24, no fever and O2 saturation 99% on room air. He had WBC count 15.9  Assessment & Plan    Principal Problem: Abdominal distention secondary to ascites  - secondary to ascites in the setting of advanced liver cirrhosis / [place] - on transplant list at [facility] - needs paracentesis which will be done 05/24/2014  - may continue lasix, lactulose, nadolol - spironolactone on hold due to hyperkalemia   Active Problems: Sepsis / Leukocytosis - sepsis criteria met on admission with hypotension, tachycardia, tachypnea. Also, elevated lactic acid, leukocytosis. No clear source of infection but cant rule out SBP. Pt was on cipro at home for SBP prophylaxis - start rocephin on this admission.   Abnormal LFT's  / Pancreatitis  - secondary to liver cirrhosis - elevated AST 74, ALP 164, bilirubin 4.2; lipase elevated at 399. - supportive care; will be careful with IV fluids due to potential overload. IV F KVO. - will monitor LFT's  Coagulopathy / anemia of chronic disease  / thrombocytopenia - secondary to advanced liver disease  - continue to monitor   Hyperkalemia - likely due to spironolactone which we temporarily placed on hold - continue lasix 20 mg daily - follow up BMP showed normal potassium   Chronic Hyponatremia - in setting liver failure.  - fluid restriction to 1.2 L  Severe protein calorie malnutrition - in the setting on chronic liver failure - nutrition consulted   DVT prophylaxis: SCD' s bilaterally    Code Status: Full code.  Family Communication: updated the patient at the bedside     IV access:   Peripheral IV  Procedures and diagnostic studies:   Dg Chest Port 1 View 05/23/2014   Lungs hypoexpanded, minimal bilateral atelectasis.   Paracentesis 05/08/2014 Colonoscopy 05/09/2014  Medical Consultants:   None   IAnti-Infectives:   Cipro was supposed to end 05/23/2014 Rocephin 05/23/2014 -->    Leisa Lenz, MD  [facility] Pager [phone]  Review of Systems:  Constitutional: Negative for fever, chills and malaise/fatigue. Negative for diaphoresis.  HENT: Negative for hearing loss, ear pain, nosebleeds, congestion, sore throat, neck pain, tinnitus and ear discharge.   Eyes: Negative for blurred vision, double vision, photophobia, pain, discharge and redness.  Respiratory: Negative for cough, hemoptysis, sputum production, wheezing and stridor.   Cardiovascular: Negative for chest pain, palpitations, orthopnea, claudication Gastrointestinal: per HPI  Genitourinary: Negative for dysuria, urgency, frequency, hematuria and flank pain.  Musculoskeletal: Negative for myalgias, back pain, joint pain and falls.  Skin: Negative for itching and rash.  Neurological: Negative for dizziness and positive for weakness. Negative for  tingling, tremors, sensory change, speech change, focal weakness, loss of consciousness and headaches.  Endo/Heme/Allergies: Negative for environmental allergies and  polydipsia. Does not bruise/bleed easily.  Psychiatric/Behavioral: Negative for suicidal ideas. The patient is not nervous/anxious.      Past Medical History  Diagnosis Date  . Hepatitis C   . Anxiety     dx w/ depression 07/2008  . Colitis   . Alcohol abuse, in remission   . Headache(784.0)   . Allergic rhinitis   . Hepatic failure due to alcoholism   . Ulcerative colitis   . Coagulopathy 01/21/2014  . Severe protein-calorie malnutrition 01/21/2014  . Depression with anxiety 04/30/2014  . Ascites 01/21/2014  . ALLERGIC RHINITIS 04/28/2008    Qualifier: Diagnosis of  By: Larose Kells MD, [place] 04/04/2007    Annotation: cscope 08-2005 (had diarrhea) Qualifier: Diagnosis of  By: Larose Kells MD, [place] HYPERSOMNIA, ASSOCIATED WITH SLEEP APNEA 09/20/2009    Qualifier: Diagnosis of  By: Elsworth Soho MD, [place], HX OF 06/15/2009    Qualifier: Diagnosis of  By: Inda Castle FNP, [place]   . HTN (hypertension) 09/26/2010  . Liver failure 01/15/2014  . Hepatic cirrhosis due to chronic hepatitis C infection 01/15/2014  . Anemia 01/19/2014  . Thrombocytopenia 01/19/2014  . Encephalopathy, hepatic 01/21/2014  . Hyponatremia 04/30/2014  . Hypoalbuminemia 04/30/2014   Past Surgical History  Procedure Laterality Date  . Tonsillectomy    . Paracentesis    . Colonoscopy N/A 05/09/2014    Procedure: COLONOSCOPY;  Surgeon: Beryle Beams, MD;  Location: [facility] ENDOSCOPY;  Service: Endoscopy;  Laterality: N/A;   Social History:  reports that he has never smoked. He does not have any smokeless tobacco history on file. He reports that he does not drink alcohol or use illicit drugs.  Allergies  Allergen Reactions  . Mango Flavor Rash    Severe rash    Family History:  Family History  Problem Relation Age of Onset  . Diabetes      GM  . Coronary artery disease    . Hyperlipidemia    . Prostate cancer    . Colon cancer Neg Hx      Prior to Admission medications    Medication Sig Start Date End Date Taking? Authorizing Provider  albuterol (PROVENTIL HFA;VENTOLIN HFA) 108 (90 BASE) MCG/ACT inhaler Inhale 2 puffs into the lungs every 6 (six) hours as needed for wheezing or shortness of breath. 05/13/14  Yes Robbie Lis, MD  antiseptic oral rinse (CPC / CETYLPYRIDINIUM CHLORIDE 0.05%) 0.05 % LIQD solution 7 mLs by Mouth Rinse route 2 (two) times daily. 05/13/14  Yes Robbie Lis, MD  ciprofloxacin (CIPRO) 500 MG tablet Take 1 tablet (500 mg total) by mouth 2 (two) times daily. 05/13/14  Yes Robbie Lis, MD  furosemide (LASIX) 20 MG tablet Take 1 tablet (20 mg total) by mouth daily. 05/13/14  Yes Robbie Lis, MD  HYDROmorphone (DILAUDID) 2 MG tablet Take 0.5-1 tablets (1-2 mg total) by mouth every 3 (three) hours as needed for severe pain. 05/13/14  Yes Robbie Lis, MD  lactulose (CHRONULAC) 10 GM/15ML solution Take 30 mLs (20 g total) by mouth daily. 05/13/14  Yes Robbie Lis, MD  LORazepam (ATIVAN) 0.5 MG tablet Take 1 tablet (0.5 mg total) by mouth every 8 (eight) hours as needed for anxiety (tremors). 05/13/14  Yes Robbie Lis, MD  mirtazapine (REMERON)  7.5 MG tablet Take 7.5 mg by mouth at bedtime.    Yes Historical Provider, MD  nadolol (CORGARD) 20 MG tablet Take 1 tablet (20 mg total) by mouth daily. 05/13/14  Yes Robbie Lis, MD  ondansetron (ZOFRAN) 4 MG tablet Take 1 tablet (4 mg total) by mouth every 6 (six) hours as needed for nausea. 05/13/14  Yes Robbie Lis, MD  pantoprazole (PROTONIX) 40 MG tablet Take 1 tablet (40 mg total) by mouth 2 (two) times daily. 05/13/14  Yes Robbie Lis, MD  potassium chloride (K-DUR) 10 MEQ tablet Take 1 tablet (10 mEq total) by mouth daily. 05/13/14  Yes Robbie Lis, MD  spironolactone (ALDACTONE) 50 MG tablet Take 1 tablet (50 mg total) by mouth daily. 05/13/14  Yes Robbie Lis, MD   Physical Exam: Filed Vitals:   05/23/14 0500 05/23/14 0530 05/23/14 0600 05/23/14 0719  BP: 107/63 106/62  119/66 106/61  Pulse:    108  Temp:    97.7 F (36.5 C)  TempSrc:    Oral  Resp:  $Remo'18 19 20  'iqNfm$ Height:      Weight:      SpO2:    100%    Physical Exam  Constitutional: Appears well-developed and well-nourished. No distress.  HENT: Normocephalic. No tonsillar erythema or exudates Eyes: Conjunctivae and EOM are normal. PERRLA, no scleral icterus.  Neck: Normal ROM. Neck supple. No thyromegaly.  CVS: tachycardic, S1/S2 appreciated   Pulmonary: Effort and breath sounds normal, no stridor, rhonchi, wheezes, rales.  Abdominal: distension, tenderness in mid abdomen, no rebound or guarding.  Musculoskeletal: Normal range of motion. +2 LE pitting edema and tenderness.  Lymphadenopathy: No lymphadenopathy noted, cervical, inguinal. Neuro: Alert. Normal reflexes, muscle tone coordination. No focal neurologic deficits. Skin: Skin is warm and dry. No rash noted. Not diaphoretic. No erythema. No pallor.  Psychiatric: Normal mood and affect. Behavior, judgment, thought content normal.   Labs on Admission:  Basic Metabolic Panel:  Recent Labs Lab 05/23/14 0449  NA 120*  K 5.4*  CL 88*  CO2 20  GLUCOSE 132*  BUN 25*  CREATININE 0.94  CALCIUM 8.3*   Liver Function Tests:  Recent Labs Lab 05/23/14 0449  AST 74*  ALT 45  ALKPHOS 164*  BILITOT 4.2*  PROT 6.9  ALBUMIN 1.9*    Recent Labs Lab 05/23/14 0449  LIPASE 399*   No results for input(s): AMMONIA in the last 168 hours. CBC:  Recent Labs Lab 05/23/14 0449  WBC 15.9*  NEUTROABS 13.2*  HGB 9.8*  HCT 31.0*  MCV 91.4  PLT 58*   Cardiac Enzymes: No results for input(s): CKTOTAL, CKMB, CKMBINDEX, TROPONINI in the last 168 hours. BNP: Invalid input(s): POCBNP CBG: No results for input(s): GLUCAP in the last 168 hours.  If 7PM-7AM, please contact night-coverage [email or web address] Password [facility] 05/23/2014, 7:38 AM

## 2014-05-24 ENCOUNTER — Inpatient Hospital Stay (HOSPITAL_COMMUNITY): Payer: Medicare Other

## 2014-05-24 DIAGNOSIS — D638 Anemia in other chronic diseases classified elsewhere: Secondary | ICD-10-CM

## 2014-05-24 LAB — COMPREHENSIVE METABOLIC PANEL
ALK PHOS: 143 U/L — AB (ref 39–117)
ALT: 40 U/L (ref 0–53)
AST: 68 U/L — ABNORMAL HIGH (ref 0–37)
Albumin: 1.8 g/dL — ABNORMAL LOW (ref 3.5–5.2)
Anion gap: 8 (ref 5–15)
BUN: 24 mg/dL — ABNORMAL HIGH (ref 6–23)
CO2: 23 mEq/L (ref 19–32)
CREATININE: 0.96 mg/dL (ref 0.50–1.35)
Calcium: 7.8 mg/dL — ABNORMAL LOW (ref 8.4–10.5)
Chloride: 88 mEq/L — ABNORMAL LOW (ref 96–112)
GFR calc non Af Amer: 90 mL/min — ABNORMAL LOW (ref >90)
GLUCOSE: 108 mg/dL — AB (ref 70–99)
POTASSIUM: 5.3 meq/L (ref 3.7–5.3)
Sodium: 119 mEq/L — CL (ref 137–147)
TOTAL PROTEIN: 6.2 g/dL (ref 6.0–8.3)
Total Bilirubin: 3.9 mg/dL — ABNORMAL HIGH (ref 0.3–1.2)

## 2014-05-24 LAB — GLUCOSE, CAPILLARY: GLUCOSE-CAPILLARY: 96 mg/dL (ref 70–99)

## 2014-05-24 LAB — CBC
HCT: 28.2 % — ABNORMAL LOW (ref 39.0–52.0)
Hemoglobin: 8.9 g/dL — ABNORMAL LOW (ref 13.0–17.0)
MCH: 28.3 pg (ref 26.0–34.0)
MCHC: 31.6 g/dL (ref 30.0–36.0)
MCV: 89.8 fL (ref 78.0–100.0)
Platelets: 69 10*3/uL — ABNORMAL LOW (ref 150–400)
RBC: 3.14 MIL/uL — ABNORMAL LOW (ref 4.22–5.81)
RDW: 18.5 % — AB (ref 11.5–15.5)
WBC: 21.8 10*3/uL — AB (ref 4.0–10.5)

## 2014-05-24 MED ORDER — ALUM & MAG HYDROXIDE-SIMETH 200-200-20 MG/5ML PO SUSP
30.0000 mL | Freq: Four times a day (QID) | ORAL | Status: DC | PRN
  Administered 2014-05-24: 30 mL via ORAL
  Filled 2014-05-24: qty 30

## 2014-05-24 NOTE — Progress Notes (Addendum)
Patient ID: Bradley Woods, male   DOB: [birth year], 58 y.o.   MRN: [id number] [facility] PROGRESS NOTE  Bradley Woods [id number]:[id number] DOB: 15-Oct-1955 DOA: 05/23/2014 PCP: [name], MD  Brief narrative:    58 year old male with past medical history of liver cirrhosis secondary to hepatitis C, alcohol abuse, HCC, on transplant list at [facility], recent hospitalization for lower GI bleed who presented to [facility] ED  With worsening abdominal swelling lower extremity swelling and more shortness of breath over past few days prior to this admission. No reports of nausea or vomiting. He has chronic abdominal pain and distention and has required paracentesis previously, most recently on prior admission. No reports of fevers.  In ED, BP was 106/62; HR 113, RR 18-24, no fever and O2 saturation 99% on room air. He had WBC count 15.9 Pt underwent paracentesis 05/24/2014 with 6 L fluid removed.  Assessment/Plan:     Principal Problem: Abdominal distention secondary to ascites  - secondary to ascites in the setting of advanced liver cirrhosis / [place] - on transplant list at [facility] - paracentesis done today with roughly 6 L fluid removed - may continue lasix, lactulose, nadolol - spironolactone on hold due to hyperkalemia ; potassium normalized today so will resume spironolactone   Active Problems: Sepsis / Leukocytosis - sepsis criteria met on admission with hypotension, tachycardia, tachypnea. Also, elevated lactic acid, leukocytosis. No clear source of infection but cant rule out SBP. Pt was on cipro at home for SBP prophylaxis - started rocephin on this admission.   Abnormal LFT's  / Pancreatitis  - secondary to liver cirrhosis - elevated AST 74, ALP 164, bilirubin 4.2; lipase elevated at 399. - will monitor LFT's  Coagulopathy / anemia of chronic disease / thrombocytopenia - secondary to advanced liver disease  - continue to monitor   Hyperkalemia - likely due to spironolactone which we  temporarily placed on hold - continue lasix 20 mg daily - potassium WNL  Chronic Hyponatremia - in setting liver failure.  - fluid restriction to 1.2 L  Severe protein calorie malnutrition - in the setting on chronic liver failure - nutrition consulted   DVT prophylaxis: SCD' s bilaterally    Code Status: Full code.  Family Communication: no family at the bedside    IV access:   Peripheral IV  Procedures and diagnostic studies:    US Paracentesis 05/24/2014   Successful ultrasound-guided therapeutic paracentesis yielding 6.1 liters of peritoneal fluid.  Read by: Rowe Robert, PA-C   Electronically Signed   By: Arne Cleveland M.D.   On: 05/24/2014 10:40   Dg Chest Port 1 View 05/23/2014  Lungs hypoexpanded, minimal bilateral atelectasis.   Electronically Signed   By: Garald Balding M.D.   On: 05/23/2014 06:40   Medical Consultants:   None  Other Consultants:   None  IAnti-Infectives:   Cipro was supposed to end 05/23/2014 Rocephin 05/23/2014 -->   Leisa Lenz, MD  [facility] Pager [phone]  If 7PM-7AM, please contact night-coverage [email or web address] Password [email or web address] 05/24/2014, 4:00 PM   LOS: 1 day    HPI/Subjective: No acute overnight events.  Objective: Filed Vitals:   05/24/14 0939 05/24/14 0948 05/24/14 1350 05/24/14 1404  BP: 125/72 132/67 104/67 104/67  Pulse:   96   Temp:   98.7 F (37.1 C) 98.7 F (37.1 C)  TempSrc:   Oral   Resp:   18 18  Height:      Weight:      SpO2:  98% 98%    Intake/Output Summary (Last 24 hours) at 05/24/14 1600 Last data filed at 05/24/14 1300  Gross per 24 hour  Intake    958 ml  Output   1360 ml  Net   -402 ml    Exam:   General:  Pt is alert, follows commands appropriately, not in acute distress  Cardiovascular: Regular rate and rhythm, S1/S2, no murmurs  Respiratory: Clear to auscultation bilaterally, no wheezing, no crackles, no rhonchi  Abdomen: Soft, non tender, non distended, bowel  sounds present  Extremities: No edema, pulses DP and PT palpable bilaterally  Neuro: Grossly nonfocal  Data Reviewed: Basic Metabolic Panel:  Recent Labs Lab 05/23/14 0449 05/23/14 0917 05/24/14 0549  NA 120* 118* 119*  K 5.4* 5.1 5.3  CL 88* 88* 88*  CO2 $Re'20 21 23  'Qfd$ GLUCOSE 132* 110* 108*  BUN 25* 24* 24*  CREATININE 0.94 0.84 0.96  CALCIUM 8.3* 7.7* 7.8*  MG  --  1.9  --   PHOS  --  3.2  --    Liver Function Tests:  Recent Labs Lab 05/23/14 0449 05/23/14 0917 05/24/14 0549  AST 74* 63* 68*  ALT 45 39 40  ALKPHOS 164* 148* 143*  BILITOT 4.2* 3.4* 3.9*  PROT 6.9 6.0 6.2  ALBUMIN 1.9* 1.7* 1.8*    Recent Labs Lab 05/23/14 0449  LIPASE 399*   No results for input(s): AMMONIA in the last 168 hours. CBC:  Recent Labs Lab 05/23/14 0449 05/23/14 0917 05/24/14 0549  WBC 15.9* 15.9* 21.8*  NEUTROABS 13.2* 12.6*  --   HGB 9.8* 8.3* 8.9*  HCT 31.0* 26.7* 28.2*  MCV 91.4 89.6 89.8  PLT 58* 52* 69*   Cardiac Enzymes: No results for input(s): CKTOTAL, CKMB, CKMBINDEX, TROPONINI in the last 168 hours. BNP: Invalid input(s): POCBNP CBG:  Recent Labs Lab 05/23/14 0813 05/24/14 0747  GLUCAP 107* 96    No results found for this or any previous visit (from the past 240 hour(s)).   Scheduled Meds: . cefTRIAXone (ROCEPHIN)  IV  1 g Intravenous Q24H  . furosemide  20 mg Oral Daily  . lactulose  20 g Oral Daily  . mirtazapine  7.5 mg Oral QHS  . nadolol  20 mg Oral Daily  . pantoprazole  40 mg Oral BID  . sodium chloride  3 mL Intravenous Q12H   Continuous Infusions:

## 2014-05-24 NOTE — Procedures (Signed)
US guided therapeutic paracentesis performed yielding 6.1 liters amber fluid. No immediate complications.

## 2014-05-24 NOTE — Progress Notes (Signed)
Down to Ultra sound for paracentesis.

## 2014-05-24 NOTE — Plan of Care (Signed)
Problem: Problem: Pain Management Progression Goal: OTHER PAIN GOAL(S) Outcome: Progressing Down to Ultra sound for a paracentesis. 6.1 L removed Bandaid d/i

## 2014-05-24 NOTE — Plan of Care (Signed)
Problem: Problem: Pain Management Progression Goal: Pain controlled Outcome: Progressing

## 2014-05-25 DIAGNOSIS — K7290 Hepatic failure, unspecified without coma: Secondary | ICD-10-CM

## 2014-05-25 LAB — BASIC METABOLIC PANEL
ANION GAP: 10 (ref 5–15)
BUN: 20 mg/dL (ref 6–23)
CO2: 22 mEq/L (ref 19–32)
CREATININE: 0.89 mg/dL (ref 0.50–1.35)
Calcium: 7.8 mg/dL — ABNORMAL LOW (ref 8.4–10.5)
Chloride: 88 mEq/L — ABNORMAL LOW (ref 96–112)
Glucose, Bld: 93 mg/dL (ref 70–99)
Potassium: 5 mEq/L (ref 3.7–5.3)
Sodium: 120 mEq/L — CL (ref 137–147)

## 2014-05-25 LAB — GLUCOSE, CAPILLARY: Glucose-Capillary: 95 mg/dL (ref 70–99)

## 2014-05-25 LAB — MAGNESIUM: Magnesium: 2 mg/dL (ref 1.5–2.5)

## 2014-05-25 MED ORDER — SPIRONOLACTONE 50 MG PO TABS
50.0000 mg | ORAL_TABLET | Freq: Every day | ORAL | Status: DC
  Administered 2014-05-25 – 2014-05-26 (×2): 50 mg via ORAL
  Filled 2014-05-25 (×2): qty 1

## 2014-05-25 NOTE — Progress Notes (Signed)
INITIAL NUTRITION ASSESSMENT  DOCUMENTATION CODES Per approved criteria  -Severe malnutrition in the context of chronic illness  Pt meets criteria for severe MALNUTRITION in the context of chronic illness as evidenced by 10% weight loss x 4 months and severe fluid accumulation.  INTERVENTION: -Provide Magic cup TID with meals, each supplement provides 290 kcal and 9 grams of protein -Encourage PO intake -RD to continue to monitor  NUTRITION DIAGNOSIS: Increased nutrient (protein) needs related to cirrhosis as evidenced by estimated nutrient needs.   Goal: Pt to meet >/= 90% of their estimated nutrition needs   Monitor:  PO and supplemental intake, weight, labs, I/O's  Reason for Assessment: Consult for diet education  Admitting Dx: Abdominal distension  ASSESSMENT: 58 year old male with past medical history of liver cirrhosis secondary to hepatitis C, alcohol abuse, HCC, on transplant list at [facility], recent hospitalization for lower GI bleed who presented to [facility] ED With worsening abdominal swelling lower extremity swelling and more shortness of breath over past few days.   Pt reports weight loss, did not specify how much. Per weight history documentation, pt has lost 17 lb since July (10% wt loss x 4 months). Pt had parecentesis on 11/29, pt had 6.1 L removed. Pt's weight tends to fluctuate d/t fluid gain/loss.  Pt states that he drinks whey protein drinks at home. He states that he tries to consume 100g of protein a day, states this is the goal his doctor set for him.  Pt shows noticeable signs of depletion in temporal and clavicle regions. Pt very edematous.  Edema: +3 RLE, +3 LLE edema, ascites  Labs reviewed: Low Na Mg/Phos WNL  Height: Ht Readings from Last 1 Encounters:  05/23/14 5\' 9"  (1.753 m)    Weight: Wt Readings from Last 1 Encounters:  05/25/14 159 lb 11.2 oz (72.439 kg)    Ideal Body Weight: 160 lb  % Ideal Body Weight: 99%  Wt Readings from Last 10  Encounters:  05/25/14 159 lb 11.2 oz (72.439 kg)  05/11/14 154 lb 6 oz (70.024 kg)  04/30/14 168 lb (76.204 kg)  01/21/14 176 lb 12.9 oz (80.2 kg)  10/06/10 210 lb (95.255 kg)  09/26/10 211 lb 3.2 oz (95.8 kg)  09/28/09 202 lb 3.2 oz (91.717 kg)  09/20/09 206 lb 2.1 oz (93.501 kg)  08/06/09 199 lb 9.6 oz (90.538 kg)  06/30/09 201 lb 12.8 oz (91.536 kg)    Usual Body Weight: 172 lb -per pt  % Usual Body Weight: 92%  BMI:  Body mass index is 23.57 kg/(m^2).  Estimated Nutritional Needs: Kcal: 2200-2400 Protein: 110-120g Fluid: Per MD  Skin: ecchymosis  Diet Order: Diet regular  EDUCATION NEEDS: -No education needs identified at this time   Intake/Output Summary (Last 24 hours) at 05/25/14 1236 Last data filed at 05/25/14 0436  Gross per 24 hour  Intake    527 ml  Output    460 ml  Net     67 ml    Last BM: 11/28  Labs:   Recent Labs Lab 05/23/14 0917 05/24/14 0549 05/25/14 0450  NA 118* 119* 120*  K 5.1 5.3 5.0  CL 88* 88* 88*  CO2 21 23 22   BUN 24* 24* 20  CREATININE 0.84 0.96 0.89  CALCIUM 7.7* 7.8* 7.8*  MG 1.9  --  2.0  PHOS 3.2  --   --   GLUCOSE 110* 108* 93    CBG (last 3)   Recent Labs  05/23/14 0813 05/24/14  0747 05/25/14 0749  GLUCAP 107* 96 95    Scheduled Meds: . cefTRIAXone (ROCEPHIN)  IV  1 g Intravenous Q24H  . furosemide  20 mg Oral Daily  . lactulose  20 g Oral Daily  . mirtazapine  7.5 mg Oral QHS  . nadolol  20 mg Oral Daily  . pantoprazole  40 mg Oral BID  . sodium chloride  3 mL Intravenous Q12H  . spironolactone  50 mg Oral Daily    Continuous Infusions:   Past Medical History  Diagnosis Date  . Hepatitis C   . Anxiety     dx w/ depression 07/2008  . Colitis   . Alcohol abuse, in remission   . Headache(784.0)   . Allergic rhinitis   . Hepatic failure due to alcoholism   . Ulcerative colitis   . Coagulopathy 01/21/2014  . Severe protein-calorie malnutrition 01/21/2014  . Depression with anxiety 04/30/2014   . Ascites 01/21/2014  . ALLERGIC RHINITIS 04/28/2008    Qualifier: Diagnosis of  By: Larose Kells MD, [place] 04/04/2007    Annotation: cscope 08-2005 (had diarrhea) Qualifier: Diagnosis of  By: Larose Kells MD, [place] HYPERSOMNIA, ASSOCIATED WITH SLEEP APNEA 09/20/2009    Qualifier: Diagnosis of  By: Elsworth Soho MD, [place], HX OF 06/15/2009    Qualifier: Diagnosis of  By: Inda Castle FNP, [place]   . HTN (hypertension) 09/26/2010  . Liver failure 01/15/2014  . Hepatic cirrhosis due to chronic hepatitis C infection 01/15/2014  . Anemia 01/19/2014  . Thrombocytopenia 01/19/2014  . Encephalopathy, hepatic 01/21/2014  . Hyponatremia 04/30/2014  . Hypoalbuminemia 04/30/2014    Past Surgical History  Procedure Laterality Date  . Tonsillectomy    . Paracentesis    . Colonoscopy N/A 05/09/2014    Procedure: COLONOSCOPY;  Surgeon: Beryle Beams, MD;  Location: [facility] ENDOSCOPY;  Service: Endoscopy;  Laterality: N/A;    Clayton Bibles, MS, RD, LDN Pager: [phone] After Hours Pager: [phone]

## 2014-05-25 NOTE — Progress Notes (Signed)
Patient ID: Bradley Woods, male   DOB: August 10, 1955, 58 y.o.   MRN: [id number] [facility] PROGRESS NOTE  Bradley Woods [id number] DOB: 10/15/1955 DOA: 05/23/2014 PCP: [name], MD  Brief narrative:    58 year old male with past medical history of liver cirrhosis secondary to hepatitis C, alcohol abuse, HCC, on transplant list at [facility], recent hospitalization for lower GI bleed who presented to [facility] ED With worsening abdominal swelling lower extremity swelling and more shortness of breath over past few days prior to this admission. No reports of nausea or vomiting. He has chronic abdominal pain and distention and has required paracentesis previously, most recently on prior admission. No reports of fevers.  In ED, BP was 106/62; HR 113, RR 18-24, no fever and O2 saturation 99% on room air. He had WBC count 15.9 Pt underwent paracentesis 05/24/2014 with 6 L fluid removed.  Assessment/Plan:     Principal Problem: Abdominal distention secondary to ascites  - secondary to ascites in the setting of advanced liver cirrhosis / [place] - on transplant list at [facility] - paracentesis done 05/24/2014 with  6.1 L fluid removed; pt feels better after paracentesis  - we will continue lasix, lactulose, nadolol - resume spironolactone since potassium normalized   Active Problems: Sepsis / Leukocytosis - sepsis criteria met on admission with hypotension, tachycardia, tachypnea. Also, elevated lactic acid, leukocytosis. No clear source of infection but cant rule out SBP. Pt was on cipro at home for SBP prophylaxis - continue rocephin   Abnormal LFT's / Pancreatitis  - secondary to liver cirrhosis - elevated AST 74, ALP 164, bilirubin 4.2; lipase elevated at 399.  Coagulopathy / anemia of chronic disease / thrombocytopenia - secondary to advanced liver disease  - continue to monitor   Hyperkalemia - likely due to spironolactone which we temporarily placed on hold - continue lasix 20 mg  daily - potassium WNL  Chronic Hyponatremia - in setting liver failure.  - improving with fluid restriction to 1.2 L a day   Severe protein calorie malnutrition - in the setting on chronic liver failure - nutrition consulted   DVT prophylaxis:  - SCD' s bilaterally    Code Status: Full code.  Family Communication: no family at the bedside Disposition Plan: Home when stable.   IV access:   Peripheral IV  Procedures and diagnostic studies:    US Paracentesis 05/24/2014 Successful ultrasound-guided therapeutic paracentesis yielding 6.1 liters of peritoneal fluid. Read by: Rowe Robert, PA-C Electronically Signed  By: Arne Cleveland M.D. On: 05/24/2014 10:40   Dg Chest Port 1 View 05/23/2014 Lungs hypoexpanded, minimal bilateral atelectasis. Electronically Signed By: Garald Balding M.D. On: 05/23/2014 06:40   Medical Consultants:   None  Other Consultants:   None  IAnti-Infectives:    Cipro was supposed to end 05/23/2014  Rocephin 05/23/2014 -->   Leisa Lenz, MD  [facility] Pager [phone]  If 7PM-7AM, please contact night-coverage [email or web address] Password [email or web address] 05/25/2014, 9:21 AM   LOS: 2 days    HPI/Subjective: No acute overnight events.  Objective: Filed Vitals:   05/24/14 1350 05/24/14 1404 05/24/14 2057 05/25/14 0434  BP: 104/67 104/67 109/70 110/64  Pulse: 96  83 89  Temp: 98.7 F (37.1 C) 98.7 F (37.1 C) 97.6 F (36.4 C) 97.3 F (36.3 C)  TempSrc: Oral  Oral Oral  Resp: _0 Height:      Weight:    72.439 kg (159 lb 11.2 oz)  SpO2: 98% 98% 96%  Intake/Output Summary (Last 24 hours) at 05/25/14 0921 Last data filed at 05/25/14 0436  Gross per 24 hour  Intake   1010 ml  Output    960 ml  Net     50 ml    Exam:   General:  Pt is alert, not in acute distress  Cardiovascular: Regular rate and rhythm, S1/S2 appreciated   Respiratory: diminished breath sounds, no wheezing, no crackles, no  rhonchi  Abdomen: softly distended, bowel sounds present  Extremities: +3 LE pitting edema, pulses DP and PT palpable bilaterally  Neuro: Grossly nonfocal  Data Reviewed: Basic Metabolic Panel:  Recent Labs Lab 05/23/14 0449 05/23/14 0917 05/24/14 0549 05/25/14 0450  NA 120* 118* 119* 120*  K 5.4* 5.1 5.3 5.0  CL 88* 88* 88* 88*  CO2 _0 GLUCOSE 132* 110* 108* 93  BUN 25* 24* 24* 20  CREATININE 0.94 0.84 0.96 0.89  CALCIUM 8.3* 7.7* 7.8* 7.8*  MG  --  1.9  --  2.0  PHOS  --  3.2  --   --    Liver Function Tests:  Recent Labs Lab 05/23/14 0449 05/23/14 0917 05/24/14 0549  AST 74* 63* 68*  ALT 45 39 40  ALKPHOS 164* 148* 143*  BILITOT 4.2* 3.4* 3.9*  PROT 6.9 6.0 6.2  ALBUMIN 1.9* 1.7* 1.8*    Recent Labs Lab 05/23/14 0449  LIPASE 399*   No results for input(s): AMMONIA in the last 168 hours. CBC:  Recent Labs Lab 05/23/14 0449 05/23/14 0917 05/24/14 0549  WBC 15.9* 15.9* 21.8*  NEUTROABS 13.2* 12.6*  --   HGB 9.8* 8.3* 8.9*  HCT 31.0* 26.7* 28.2*  MCV 91.4 89.6 89.8  PLT 58* 52* 69*   Cardiac Enzymes: No results for input(s): CKTOTAL, CKMB, CKMBINDEX, TROPONINI in the last 168 hours. BNP: Invalid input(s): POCBNP CBG:  Recent Labs Lab 05/23/14 0813 05/24/14 0747 05/25/14 0749  GLUCAP 107* 96 95    No results found for this or any previous visit (from the past 240 hour(s)).   Scheduled Meds: . cefTRIAXone (ROCEPHIN)  IV  1 g Intravenous Q24H  . furosemide  20 mg Oral Daily  . lactulose  20 g Oral Daily  . mirtazapine  7.5 mg Oral QHS  . nadolol  20 mg Oral Daily  . pantoprazole  40 mg Oral BID

## 2014-05-25 NOTE — Telephone Encounter (Signed)
Letter mailed to patient's home address.

## 2014-05-25 NOTE — Progress Notes (Signed)
CRITICAL VALUE ALERT  Critical value received:  Na 120  Date of notification:  11/30  Time of notification:  5:52 AM   Critical value read back:Yes.    Nurse who received alert:  jm  MD notified (1st page):  na  Time of first page:  na  MD notified (2nd page):  Time of second page:  Responding MD:  na  Time MD responded:  Na  Result similar to already reported, treatment underway

## 2014-05-26 LAB — BASIC METABOLIC PANEL
Anion gap: 14 (ref 5–15)
BUN: 32 mg/dL — AB (ref 6–23)
CHLORIDE: 88 meq/L — AB (ref 96–112)
CO2: 19 meq/L (ref 19–32)
Calcium: 8.2 mg/dL — ABNORMAL LOW (ref 8.4–10.5)
Creatinine, Ser: 1.24 mg/dL (ref 0.50–1.35)
GFR calc Af Amer: 72 mL/min — ABNORMAL LOW (ref >90)
GFR calc non Af Amer: 62 mL/min — ABNORMAL LOW (ref >90)
GLUCOSE: 102 mg/dL — AB (ref 70–99)
Potassium: 5.8 mEq/L — ABNORMAL HIGH (ref 3.7–5.3)
Sodium: 121 mEq/L — CL (ref 137–147)

## 2014-05-26 MED ORDER — HYDROMORPHONE HCL 2 MG PO TABS: 1.0000 mg | ORAL_TABLET | ORAL | Status: AC | PRN

## 2014-05-26 MED ORDER — SODIUM POLYSTYRENE SULFONATE 15 GM/60ML PO SUSP
30.0000 g | Freq: Once | ORAL | Status: AC
  Administered 2014-05-26: 30 g via ORAL
  Filled 2014-05-26: qty 120

## 2014-05-26 NOTE — Discharge Instructions (Signed)
Ascites °Ascites is a gathering of fluid in the belly (abdomen). This is most often caused by liver disease. It may also be caused by a number of other less common problems. It causes a ballooning out (distension) of the abdomen. °CAUSES  °Scarring of the liver (cirrhosis) is the most common cause of ascites. Other causes include: °· Infection or inflammation in the abdomen. °· Cancer in the abdomen. °· Heart failure. °· Certain forms of kidney failure (nephritic syndrome). °· Inflammation of the pancreas. °· Clots in the veins of the liver. °SYMPTOMS  °In the early stages of ascites, you may not have any symptoms. The main symptom of ascites is a sense of abdominal bloating. This is due to the presence of fluid. This may also cause an increase in abdominal or waist size. People with this condition can develop swelling in the legs, and men can develop a swollen scrotum. When there is a lot of fluid, it may be hard to breath. Stretching of the abdomen by fluid can be painful. °DIAGNOSIS  °Certain features of your medical history, such as a history of liver disease and of an enlarging abdomen, can suggest the presence of ascites. The diagnosis of ascites can be made on physical exam by your caregiver. An abdominal ultrasound examination can confirm that ascites is present, and estimate the amount of fluid. °Once ascites is confirmed, it is important to determine its cause. Again, a history of one of the conditions listed in "CAUSES" provides a strong clue. A physical exam is important, and blood and X-ray tests may be needed. During a procedure called paracentesis, a sample of fluid is removed from the abdomen. This can determine certain key features about the fluid, such as whether or not infection or cancer is present. Your caregiver will determine if a paracentesis is necessary. They will describe the procedure to you. °PREVENTION  °Ascites is a complication of other conditions. Therefore to prevent ascites, you  must seek treatment for any significant health conditions you have. Once ascites is present, careful attention to fluid and salt intake may help prevent it from getting worse. If you have ascites, you should not drink alcohol. °PROGNOSIS  °The prognosis of ascites depends on the underlying disease. If the disease is reversible, such as with certain infections or with heart failure, then ascites may improve or disappear. When ascites is caused by cirrhosis, then it indicates that the liver disease has worsened, and further evaluation and treatment of the liver disease is needed. If your ascites is caused by cancer, then the success or failure of the cancer treatment will determine whether your ascites will improve or worsen. °RISKS AND COMPLICATIONS  °Ascites is likely to worsen if it is not properly diagnosed and treated. A large amount of ascites can cause pain and difficulty breathing. The main complication, besides worsening, is infection (called spontaneous bacterial peritonitis). This requires prompt treatment. °TREATMENT  °The treatment of ascites depends on its cause. When liver disease is your cause, medical management using water pills (diuretics) and decreasing salt intake is often effective. Ascites due to peritoneal inflammation or malignancy (cancer) alone does not respond to salt restriction and diuretics. Hospitalization is sometimes required. °If the treatment of ascites cannot be managed with medications, a number of other treatments are available. Your caregivers will help you decide which will work best for you. Some of these are: °· Removal of fluid from the abdomen (paracentesis). °· Fluid from the abdomen is passed into a vein (peritoneovenous shunting). °·   Liver transplantation. °· Transjugular intrahepatic portosystemic stent shunt. °HOME CARE INSTRUCTIONS  °It is important to monitor body weight and the intake and output of fluids. Weigh yourself at the same time every day. Record your  weights. Fluid restriction may be necessary. It is also important to know your salt intake. The more salt you take in, the more fluid you will retain. Ninety percent of people with ascites respond to this approach. °· Follow any directions for medicines carefully. °· Follow up with your caregiver, as directed. °· Report any changes in your health, especially any new or worsening symptoms. °· If your ascites is from liver disease, avoid alcohol and other substances toxic to the liver. °SEEK MEDICAL CARE IF:  °· Your weight increases more than a few pounds in a few days. °· Your abdominal or waist size increases. °· You develop swelling in your legs. °· You had swelling and it worsens. °SEEK IMMEDIATE MEDICAL CARE IF:  °· You develop a fever. °· You develop new abdominal pain. °· You develop difficulty breathing. °· You develop confusion. °· You have bleeding from the mouth, stomach, or rectum. °MAKE SURE YOU:  °· Understand these instructions. °· Will watch your condition. °· Will get help right away if you are not doing well or get worse. °Document Released: 06/12/2005 Document Revised: 09/04/2011 Document Reviewed: 01/11/2007 °[employer]® Patient Information ©2015 [employer]. This information is not intended to replace advice given to you by your health care provider. Make sure you discuss any questions you have with your health care provider. ° °

## 2014-05-26 NOTE — Progress Notes (Signed)
CRITICAL VALUE ALERT  Critical value received:  Na+ 121  Date of notification:  05/26/14  Time of notification:  2883  Critical value read back:Yes.    Nurse who received alert:  Laural Benes, RN  MD notified (1st page):  Dr. Charlies Silvers   Time of first page:  1145  MD notified (2nd page):  Time of second page:  Responding MD:  Dr. Charlies Silvers  Time MD responded:  1200

## 2014-05-26 NOTE — Progress Notes (Signed)
Pt discharged home with spouse in stable condition. Discharge instructions and scripts given. Pt and wife verbalized understanding  

## 2014-05-26 NOTE — Care Management Note (Signed)
CARE MANAGEMENT NOTE 05/26/2014  Patient:  Bradley Woods, Bradley Woods   Account Number:  [id number]  Date Initiated:  05/26/2014  Documentation initiated by:  Marney Doctor  Subjective/Objective Assessment:   58 yo admitted with abdominal distension. past medical history of liver cirrhosis secondary to hepatitis C, alcohol abuse, HCC, on transplant list at [facility],     Action/Plan:   From home with spouse   Anticipated DC Date:  05/26/2014   Anticipated DC Plan:  [place]  CM consult      [facility] Choice  HOME HEALTH   Choice offered to / List presented to:  C-1 Patient        [place] arranged  HH-1 RN  [place]      [place] agency  [place]   Status of service:  Completed, signed off Medicare Important Message given?  YES (If response is "NO", the following Medicare IM given date fields will be blank) Date Medicare IM given:  05/26/2014 Medicare IM given by:  Marney Doctor Date Additional Medicare IM given:   Additional Medicare IM given by:    Discharge Disposition:    Per UR Regulation:  Reviewed for med. necessity/level of care/duration of stay  If discussed at [place] of Stay Meetings, dates discussed:    Comments:  05/26/14 Marney Doctor RN,BSN,NCM [phone] Pt identified as a high risk readmission pt.  HH orders written.  [facility] is [place] agency of the week and will assume pts [place] care.  Mary from [place] called to give referral.  Pt aware that [facility] will be contacting him.

## 2014-05-26 NOTE — Discharge Summary (Addendum)
Physician Discharge Summary  Bradley Woods IRW:[id number] DOB: October 22, 1955 DOA: 05/23/2014  PCP: Bradley November, MD  Admit date: 05/23/2014 Discharge date: 05/26/2014  Recommendations for Outpatient Follow-up:  1. Home health orders in place. I spoke extensively with the patient's wife at the bedside about following at [facility] for transplant. Patient is on the waiting list but from what she was telling me he may not even be a candidate at this point because his strength has declined. Wife was asking where candidate due paracentesis in case his abdomen becomes distended again. They do not have a primary care physician other than Dr. Larose Woods with a follow with but he cannot do paracentesis at the office. I encouraged him to talk to the GI physician who they were seen before Dr. Tobin Woods and try to arrange paracentesis in his office.   Discharge Diagnoses:  Principal Problem:   Abdominal distension Active Problems:   Ascites   Thrombocytopenia   Coagulopathy   Severe protein-calorie malnutrition   Hyponatremia   Decompensation of cirrhosis of liver   Hepatocellular carcinoma   Weakness generalized   Leukocytosis   Anemia of chronic disease    Discharge Condition: stable   Diet recommendation: as tolerated   History of present illness:  58 year old male with past medical history of liver cirrhosis secondary to hepatitis C, alcohol abuse, HCC, on transplant list at [facility], recent hospitalization for lower GI bleed who presented to [facility] ED With worsening abdominal swelling lower extremity swelling and more shortness of breath over past few days prior to this admission. No reports of nausea or vomiting. He has chronic abdominal pain and distention and has required paracentesis previously, most recently on prior admission. No reports of fevers.  In ED, BP was 106/62; HR 113, RR 18-24, no fever and O2 saturation 99% on room air. He had WBC count 15.9 Pt underwent paracentesis 05/24/2014  with 6 L fluid removed.  Assessment/Plan:    Principal Problem: Abdominal distention secondary to ascites  - secondary to ascites in the setting of advanced liver cirrhosis / [place] - on transplant list at [facility] - paracentesis done 05/24/2014 with 6.1 L fluid removed; pt feels better after paracentesis  - we will continue lasix, lactulose, nadolol - Patient will continue ciprofloxacin, left over from last hospitalization. He has 3 more days of ciprofloxacin to complete.  Active Problems: Sepsis / Leukocytosis - sepsis criteria met on admission with hypotension, tachycardia, tachypnea. Also, elevated lactic acid, leukocytosis. No clear source of infection but cant rule out SBP. Pt was on cipro at home for SBP prophylaxis - Patient received Rocephin in hospital but will continue Cipro on discharge per previous home regimen.  Abnormal LFT's / Pancreatitis  - secondary to liver cirrhosis - elevated AST 74, ALP 164, bilirubin 4.2; lipase elevated at 399.  Coagulopathy / anemia of chronic disease / thrombocytopenia - secondary to advanced liver disease  - continue to monitor   Hyperkalemia - spironolactone placed on hold for now since potassium 5.8. Give 1 dose kayexalate now.  - spoke with family and patient and his wife want to go home. Will ask [place] RN to recheck potassium level at home. I left my phone number for RN to call me with the result. Unfortunately i think this patient is approaching end of life but family not yet ready to accept this. They are still hopefule pt will be able to have liver transplant.  - continue lasix 20 mg daily - potassium WNL  Chronic Hyponatremia - in setting liver failure.  - improving with fluid restriction to 1.2 L a day  - sodium is 121 prior to discharge.  Severe protein calorie malnutrition - in the setting on chronic liver failure - nutrition consulted   DVT prophylaxis:  - SCD' s bilaterally    Code Status: Full code.  Family  Communication: no family at the bedside   IV access:   Peripheral IV  Procedures and diagnostic studies:   US Paracentesis 05/24/2014 Successful ultrasound-guided therapeutic paracentesis yielding 6.1 liters of peritoneal fluid.   Dg Chest Port 1 View 05/23/2014 Lungs hypoexpanded, minimal bilateral atelectasis.  Medical Consultants:   None   Other Consultants:   None   IAnti-Infectives:     Rocephin 05/23/2014 --> 05/26/2014     Signed:  Leisa Lenz, MD  [facility] 05/26/2014, 12:52 PM  Pager #: [phone]   Discharge Exam: Filed Vitals:   05/26/14 0630  BP: 96/52  Pulse:   Temp:   Resp:    Filed Vitals:   05/25/14 1450 05/25/14 2134 05/26/14 0528 05/26/14 0630  BP: 1$Rem'00/52 96/56 90/54 'xvOH$ 96/52  Pulse: 60 85 81   Temp: 98.4 F (36.9 C) 97.9 F (36.6 C) 98 F (36.7 C)   TempSrc: Oral Oral Oral   Resp: $Remo'16 16 16   'yLCUV$ Height:      Weight:   73.483 kg (162 lb)   SpO2: 95% 93% 92%     General: Pt is alert, skin color more ashy Cardiovascular: Regular rate and rhythm, S1/S2 appreciated  Respiratory: no wheezing, no crackles, no rhonchi Abdominal: softly distended, bowel sounds +, no guarding Extremities: + 2 LE pitting edema, no cyanosis, pulses palpable bilaterally DP and PT Neuro: Grossly nonfocal  Discharge Instructions  Discharge Instructions    Call MD for:  difficulty breathing, headache or visual disturbances    Complete by:  As directed      Call MD for:  persistant dizziness or light-headedness    Complete by:  As directed      Call MD for:  persistant nausea and vomiting    Complete by:  As directed      Call MD for:  severe uncontrolled pain    Complete by:  As directed      Diet - low sodium heart healthy    Complete by:  As directed      Increase activity slowly    Complete by:  As directed             Medication List    STOP taking these medications        LORazepam 0.5 MG tablet  Commonly known as:   ATIVAN      TAKE these medications        albuterol 108 (90 BASE) MCG/ACT inhaler  Commonly known as:  PROVENTIL HFA;VENTOLIN HFA  Inhale 2 puffs into the lungs every 6 (six) hours as needed for wheezing or shortness of breath.     antiseptic oral rinse 0.05 % Liqd solution  Commonly known as:  CPC / CETYLPYRIDINIUM CHLORIDE 0.05%  7 mLs by Mouth Rinse route 2 (two) times daily.     ciprofloxacin 500 MG tablet  Commonly known as:  CIPRO  Take 1 tablet (500 mg total) by mouth 2 (two) times daily.     furosemide 20 MG tablet  Commonly known as:  LASIX  Take 1 tablet (20 mg total) by mouth daily.     HYDROmorphone 2 MG tablet  Commonly known as:  DILAUDID  Take 0.5-1 tablets (1-2 mg total) by mouth every 3 (three) hours as needed for severe pain.     lactulose 10 GM/15ML solution  Commonly known as:  CHRONULAC  Take 30 mLs (20 g total) by mouth daily.     mirtazapine 7.5 MG tablet  Commonly known as:  REMERON  Take 7.5 mg by mouth at bedtime.     nadolol 20 MG tablet  Commonly known as:  CORGARD  Take 1 tablet (20 mg total) by mouth daily.     ondansetron 4 MG tablet  Commonly known as:  ZOFRAN  Take 1 tablet (4 mg total) by mouth every 6 (six) hours as needed for nausea.     pantoprazole 40 MG tablet  Commonly known as:  PROTONIX  Take 1 tablet (40 mg total) by mouth 2 (two) times daily.     potassium chloride 10 MEQ tablet  Commonly known as:  K-DUR  Take 1 tablet (10 mEq total) by mouth daily.     spironolactone 50 MG tablet  Commonly known as:  ALDACTONE  Take 1 tablet (50 mg total) by mouth daily.           Follow-up Information    Follow up with Bradley November, MD. Schedule an appointment as soon as possible for a visit in 2 weeks.   Specialty:  Internal Medicine   Why:  Follow up appt after recent hospitalization   Contact information:   [place]  [id number] [phone]        The results of significant diagnostics  from this hospitalization (including imaging, microbiology, ancillary and laboratory) are listed below for reference.    Significant Diagnostic Studies: Dg Chest 2 View  05/07/2014   CLINICAL DATA:  Cough, shortness of breath, ascites. History of liver failure.  EXAM: CHEST  2 VIEW  COMPARISON:  PA and lateral chest 04/30/2014.  FINDINGS: Airspace disease throughout the right chest seen on the prior study has markedly improved. The left lung appears clear. Heart size is normal. No pneumothorax or pleural effusion. Marked degenerative change right shoulder is noted.  IMPRESSION: Marked improvement in airspace disease in the right chest consistent with resolving pneumonia. No new abnormality.   Electronically Signed   By: Inge Rise M.D.   On: 05/07/2014 05:31   Dg Chest 2 View  04/30/2014   CLINICAL DATA:  Liver failure. Now with shortness of breath and distended abdomen  EXAM: CHEST  2 VIEW  COMPARISON:  8/28/9  FINDINGS: Normal heart size. There is diffuse airspace opacification throughout the right lung. This is new from previous exam. Left lung is clear. No pleural effusions identified. 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  IMPRESSION: 1. Asymmetric opacification of the right lung, new from previous exam. This may represent multifocal pneumonia or asymmetric edema.   Electronically Signed   By: Kerby Moors M.D.   On: 04/30/2014 11:46   US Abdomen Limited  05/11/2014   CLINICAL DATA:  Ascites.  EXAM: LIMITED ABDOMEN ULTRASOUND FOR ASCITES  TECHNIQUE: Limited ultrasound survey for ascites was performed in all four abdominal quadrants.  COMPARISON:  None.  FINDINGS: Fluid is seen in all 4 quadrants of the abdomen.  IMPRESSION: Moderate ascites.   Electronically Signed   By: Lorin Picket M.D.   On: 05/11/2014 12:28   US Paracentesis  05/24/2014   INDICATION: Hepatitis-C, cirrhosis, recurrent ascites. Request is made for therapeutic paracentesis .  EXAM:  ULTRASOUND-GUIDED THERAPEUTIC PARACENTESIS  COMPARISON:  PRIOR PARACENTESIS ON 05/07/2014  MEDICATIONS: None.  COMPLICATIONS: None immediate  TECHNIQUE: Informed written consent was obtained from the patient after a discussion of the risks, benefits and alternatives to treatment. A timeout was performed prior to the initiation of the procedure.  Initial ultrasound scanning demonstrates a large amount of ascites within the right lower abdominal quadrant. The right lower abdomen was prepped and draped in the usual sterile fashion. 1% lidocaine was used for local anesthesia. Under direct ultrasound guidance, a 19 gauge, 7-cm, Yueh catheter was introduced. An ultrasound image was saved for documentation purposed. The paracentesis was performed. The catheter was removed and a dressing was applied. The patient tolerated the procedure well without immediate post procedural complication.  FINDINGS: A total of approximately 6.1 liters of amber fluid was removed.  IMPRESSION: Successful ultrasound-guided therapeutic paracentesis yielding 6.1 liters of peritoneal fluid.  Read by: Rowe Robert, PA-C   Electronically Signed   By: Arne Cleveland M.D.   On: 05/24/2014 10:40   US Paracentesis  05/07/2014   CLINICAL DATA:  Cirrhosis, hepatitis-C, recurrent ascites. Request for therapeutic paracentesis.  EXAM: ULTRASOUND GUIDED PARACENTESIS  COMPARISON:  Previous paracentesis  PROCEDURE: An ultrasound guided paracentesis was thoroughly discussed with the patient and questions answered. The benefits, risks, alternatives and complications were also discussed. The patient understands and wishes to proceed with the procedure. Written consent was obtained.  Ultrasound was performed to localize and mark an adequate pocket of fluid in the right lower quadrant of the abdomen. The area was then prepped and draped in the normal sterile fashion. 1% Lidocaine was used for local anesthesia. Under ultrasound guidance a 6 French  Safe-T-Centesis catheter was introduced. Paracentesis was performed. The catheter was removed and a dressing applied.  COMPLICATIONS: None  FINDINGS: A total of approximately 3.8 L of clear yellow fluid was removed. A fluid sample was not sent for laboratory analysis.  IMPRESSION: Successful ultrasound guided paracentesis yielding 3.8 L of ascites.  Read by: Ascencion Dike PA-C   Electronically Signed   By: Corrie Mckusick D.O.   On: 05/07/2014 10:58   US Paracentesis  05/04/2014   INDICATION: Cirrhosis, hepatitis-C, recurrent ascites. Request is made for therapeutic paracentesis .  EXAM: ULTRASOUND-GUIDED THERAPEUTIC PARACENTESIS  COMPARISON:  PRIOR PARACENTESIS ON 05/01/2014  MEDICATIONS: None.  COMPLICATIONS: None immediate  TECHNIQUE: Informed written consent was obtained from the patient after a discussion of the risks, benefits and alternatives to treatment. A timeout was performed prior to the initiation of the procedure.  Initial ultrasound scanning demonstrates a small to moderate amount of ascites within the right lower abdominal quadrant. The right lower abdomen was prepped and draped in the usual sterile fashion. 1% lidocaine was used for local anesthesia. Under direct ultrasound guidance, a 19 gauge, 7-cm, Yueh catheter was introduced. An ultrasound image was saved for documentation purposed. The paracentesis was performed. The catheter was removed and a dressing was applied. The patient tolerated the procedure well without immediate post procedural complication.  FINDINGS: A total of approximately 2.8 liters of slightly turbid, yellow fluid was removed.  IMPRESSION: Successful ultrasound-guided therapeutic paracentesis yielding 2.8 liters of peritoneal fluid.  Read by: Rowe Robert, PA-C   Electronically Signed   By: Jacqulynn Cadet M.D.   On: 05/04/2014 14:04   US Paracentesis  05/01/2014   INDICATION: Cirrhosis, hepatitis-C, recurrent ascites. Request is made for diagnostic and therapeutic  paracentesis up to 5 liters.  EXAM: ULTRASOUND-GUIDED DIAGNOSTIC AND THERAPEUTIC  PARACENTESIS  COMPARISON:  PRIOR PARACENTESIS ON 01/19/2014  MEDICATIONS: None.  COMPLICATIONS: None immediate  TECHNIQUE: Informed written consent was obtained from the patient after a discussion of the risks, benefits and alternatives to treatment. A timeout was performed prior to the initiation of the procedure.  Initial ultrasound scanning demonstrates a large amount of ascites within the right lower abdominal quadrant. The right lower abdomen was prepped and draped in the usual sterile fashion. 1% lidocaine was used for local anesthesia. Under direct ultrasound guidance, a 19 gauge, 7-cm, Yueh catheter was introduced. An ultrasound image was saved for documentation purposed. The paracentesis was performed. The catheter was removed and a dressing was applied. The patient tolerated the procedure well without immediate post procedural complication.  FINDINGS: A total of approximately 5 liters liters of slightly turbid, yellow fluid was removed. Samples were sent to the laboratory as requested by the clinical team.  IMPRESSION: Successful ultrasound-guided diagnostic and therapeutic paracentesis yielding 5 liters of peritoneal fluid.  Read by: Rowe Robert, PA-C   Electronically Signed   By: Aletta Edouard M.D.   On: 05/01/2014 14:34   Dg Chest Port 1 View  05/23/2014   CLINICAL DATA:  Acute onset of shortness of breath and cough for 4 days. Initial encounter.  EXAM: PORTABLE CHEST - 1 VIEW  COMPARISON:  Chest radiograph performed 05/07/2014  FINDINGS: The lungs are hypoexpanded. Minimal bilateral atelectasis noted. Mild vascular crowding is seen. No pleural effusion or pneumothorax is identified.  The cardiomediastinal silhouette is within normal limits. No acute osseous abnormalities are seen.  IMPRESSION: Lungs hypoexpanded, minimal bilateral atelectasis.   Electronically Signed   By: Garald Balding M.D.   On: 05/23/2014 06:40    Dg Foot Complete Right  05/11/2014   CLINICAL DATA:  A traumatic right foot swelling and heel bruising  EXAM: RIGHT FOOT COMPLETE - 3+ VIEW  COMPARISON:  None.  FINDINGS: The bones of the right foot are adequately mineralized. There is no acute fracture nor dislocation. There is soft tissue swelling over the midfoot. There are arterial calcifications along the dorsum of the foot. There is irregularity of the density of the skin over the posterior aspect of the calcaneus.  IMPRESSION: There is no acute fracture, dislocation, nor evidence of significant degenerative change. There is diffuse soft tissue swelling over the midfoot with the above-mentioned soft tissue changes over the heel. Are there clinical findings of cellulitis?   Electronically Signed   By: David  Martinique   On: 05/11/2014 12:03    Microbiology: No results found for this or any previous visit (from the past 240 hour(s)).   Labs: Basic Metabolic Panel:  Recent Labs Lab 05/23/14 0449 05/23/14 0917 05/24/14 0549 05/25/14 0450 05/26/14 1023  NA 120* 118* 119* 120* 121*  K 5.4* 5.1 5.3 5.0 5.8*  CL 88* 88* 88* 88* 88*  CO2 $Re'20 21 23 22 19  'kTE$ GLUCOSE 132* 110* 108* 93 102*  BUN 25* 24* 24* 20 32*  CREATININE 0.94 0.84 0.96 0.89 1.24  CALCIUM 8.3* 7.7* 7.8* 7.8* 8.2*  MG  --  1.9  --  2.0  --   PHOS  --  3.2  --   --   --    Liver Function Tests:  Recent Labs Lab 05/23/14 0449 05/23/14 0917 05/24/14 0549  AST 74* 63* 68*  ALT 45 39 40  ALKPHOS 164* 148* 143*  BILITOT 4.2* 3.4* 3.9*  PROT 6.9 6.0 6.2  ALBUMIN 1.9* 1.7* 1.8*  Recent Labs Lab 05/23/14 0449  LIPASE 399*   No results for input(s): AMMONIA in the last 168 hours. CBC:  Recent Labs Lab 05/23/14 0449 05/23/14 0917 05/24/14 0549  WBC 15.9* 15.9* 21.8*  NEUTROABS 13.2* 12.6*  --   HGB 9.8* 8.3* 8.9*  HCT 31.0* 26.7* 28.2*  MCV 91.4 89.6 89.8  PLT 58* 52* 69*   Cardiac Enzymes: No results for input(s): CKTOTAL, CKMB, CKMBINDEX, TROPONINI  in the last 168 hours. BNP: BNP (last 3 results)  Recent Labs  05/23/14 0451  PROBNP 112.8   CBG:  Recent Labs Lab 05/23/14 0813 05/24/14 0747 05/25/14 0749  GLUCAP 107* 96 95    Time coordinating discharge: Over 30 minutes

## 2014-06-01 ENCOUNTER — Other Ambulatory Visit: Payer: Self-pay | Admitting: Gastroenterology

## 2014-06-01 DIAGNOSIS — R188 Other ascites: Secondary | ICD-10-CM

## 2014-06-01 NOTE — Telephone Encounter (Signed)
Encounter closed until patient schedules appointment.

## 2014-06-03 ENCOUNTER — Ambulatory Visit (HOSPITAL_COMMUNITY)
Admission: RE | Admit: 2014-06-03 | Discharge: 2014-06-03 | Disposition: A | Discharge status: Home / Self Care | Payer: Medicare Other | Source: Ambulatory Visit | Attending: Gastroenterology | Admitting: Gastroenterology

## 2014-06-03 DIAGNOSIS — R188 Other ascites: Secondary | ICD-10-CM | POA: Insufficient documentation

## 2014-06-03 MED ORDER — ALBUMIN HUMAN 25 % IV SOLN
25.0000 g | Freq: Once | INTRAVENOUS | Status: AC
  Administered 2014-06-03: 25 g via INTRAVENOUS
  Filled 2014-06-03: qty 100

## 2014-06-03 NOTE — Procedures (Signed)
Successful US guided paracentesis from RUQ.  Yielded 5.8 liters of serous fluid.  No immediate complications.  Pt tolerated well.  Post procedure IV Albumin was ordered.  Specimen was not sent for labs.  Tsosie Billing D PA-C 06/03/2014 2:29 PM

## 2014-06-09 ENCOUNTER — Other Ambulatory Visit: Payer: Self-pay | Admitting: Gastroenterology

## 2014-06-09 ENCOUNTER — Encounter: Payer: Self-pay | Admitting: Cardiovascular Disease

## 2014-06-09 DIAGNOSIS — R188 Other ascites: Secondary | ICD-10-CM

## 2014-06-10 ENCOUNTER — Other Ambulatory Visit: Payer: Self-pay | Admitting: Gastroenterology

## 2014-06-10 DIAGNOSIS — R188 Other ascites: Secondary | ICD-10-CM

## 2014-06-12 ENCOUNTER — Ambulatory Visit (HOSPITAL_COMMUNITY)
Admission: RE | Admit: 2014-06-12 | Discharge: 2014-06-12 | Disposition: A | Discharge status: Home / Self Care | Payer: Medicare Other | Source: Ambulatory Visit | Attending: Gastroenterology | Admitting: Gastroenterology

## 2014-06-12 DIAGNOSIS — R188 Other ascites: Secondary | ICD-10-CM | POA: Diagnosis present

## 2014-06-12 MED ORDER — LIDOCAINE HCL (PF) 1 % IJ SOLN
INTRAMUSCULAR | Status: AC
  Filled 2014-06-12: qty 10

## 2014-06-12 NOTE — Procedures (Signed)
  US guided RLQ para  5.6 liters dark yellow fluid  BP 96/57 post procedure

## 2014-06-14 ENCOUNTER — Other Ambulatory Visit: Payer: Self-pay | Admitting: Internal Medicine

## 2014-06-17 ENCOUNTER — Ambulatory Visit (HOSPITAL_COMMUNITY): Payer: Medicare Other

## 2014-06-18 ENCOUNTER — Ambulatory Visit (HOSPITAL_COMMUNITY): Admission: RE | Admit: 2014-06-18 | Payer: Medicare Other | Source: Ambulatory Visit

## 2014-06-22 ENCOUNTER — Ambulatory Visit (HOSPITAL_COMMUNITY)
Admission: RE | Admit: 2014-06-22 | Discharge: 2014-06-22 | Disposition: A | Discharge status: Home / Self Care | Payer: Medicare Other | Source: Ambulatory Visit | Attending: Gastroenterology | Admitting: Gastroenterology

## 2014-06-22 ENCOUNTER — Telehealth: Payer: Self-pay

## 2014-06-22 ENCOUNTER — Telehealth: Payer: Self-pay | Admitting: Internal Medicine

## 2014-06-22 DIAGNOSIS — R188 Other ascites: Secondary | ICD-10-CM | POA: Diagnosis not present

## 2014-06-22 MED ORDER — ALBUMIN HUMAN 25 % IV SOLN
25.0000 g | Freq: Once | INTRAVENOUS | Status: AC
  Administered 2014-06-22: 25 g via INTRAVENOUS
  Filled 2014-06-22: qty 100

## 2014-06-22 MED ORDER — SODIUM CHLORIDE 0.9 % IV SOLN
INTRAVENOUS | Status: DC
  Administered 2014-06-22: 12:00:00 via INTRAVENOUS

## 2014-06-22 NOTE — Procedures (Signed)
Successful US guided paracentesis from RLQ.  Yielded 4.5L of clear amber colored fluid.  No immediate complications.  Pt tolerated well.   Specimen was not sent for labs.  Ascencion Dike PA-C 06/22/2014 11:55 AM

## 2014-06-22 NOTE — Telephone Encounter (Signed)
although the patient has not been able to come to the office to be seen, I did sign the home health certificate today

## 2014-06-22 NOTE — Telephone Encounter (Signed)
Another form faxed stating "due to scheduling complications, OT evaluation has been moved to week 12/20."  Form labeled and placed in Dr. Ethel Rana red folder for review and signature.

## 2014-06-22 NOTE — Telephone Encounter (Signed)
Forms signed and faxed to [employer] [phone]), form placed in scanning folder to be scanned.

## 2014-06-22 NOTE — Discharge Instructions (Signed)
° ° ° ° ° ° ° ° ° ° ° ° ° ° ° ° ° ° ° ° ° ° ° ° ° ° ° ° ° ° ° ° ° ° ° ° ° ° ° ° ° ° ° ° ° ° ° ° ° ° ° ° ° ° ° ° ° ° ° ° ° ° ° ° ° ° ° ° ° ° ° ° ° ° ° ° ° ° ° ° ° ° ° ° ° ° ° ° ° ° ° ° ° ° ° ° ° ° ° ° ° ° ° ° °  Albumin °Albumin tests are done as a screen for a liver disorder or kidney disease or to check nutritional status, especially in hospitalized patients (prealbumin is sometimes used instead of albumin in this situation). °Albumin is the most plentiful protein in the blood plasma. It keeps fluid from leaking out of blood vessels; nourishes tissues; and transports hormones, vitamins, drugs, and ions like calcium throughout the body. Albumin is made in the liver and is extremely sensitive to liver damage. The concentration of albumin drops when the liver is damaged, with kidney disease (nephrotic syndrome), when a person is malnourished, if a person experiences inflammation in the body, or with shock. Albumin increases when a person is dehydrated. °PREPARATION FOR TEST °No preparation or fasting is necessary. A blood sample is taken by a needle from a vein. Tell the person doing the test if you are pregnant. °NORMAL FINDINGS  °Adult/Elderly °· Total Protein: 6.4-8.3 g/dL or 64-83g/L (SI units) °· Albumin; 3.5-5 g/dL or 35-50 g/L (SI units) °· Globulin 2.3-3.4 g/dL °¨ Alpha1 globulin: 0.1-3 g/dL or 1-3 g/L (SI units) °¨ Alpha2 globulin: 0.6-1 g/dL or6-10 g/L (SI units) °¨ Beta globulin: 0.7-1.1 g/dL or 7-11 g/L (SI units) °Children °· Total protein. °¨ Premature infant: 4.2-7.6 g/L °¨ Newborn: 4.6-7.4 g/dL °¨ Infant: 6-6.7 g/L °· Albumin. °¨ Premature infant: 3-4.2 g/dL °¨ Newborn: 3.5-5.4 g/dL °¨ Infant: 4.4-5.4 g/dL °¨ Child: 4-5.9 g/dL °Ranges for normal findings may vary among different laboratories and hospitals. You should always check with your doctor after having lab work or other tests done to discuss the meaning of your test results and whether your values are considered within normal limits. °MEANING OF  TEST  °Your caregiver will go over the test results with you and discuss the importance and meaning of your results, as well as treatment options and the need for additional tests if necessary. °OBTAINING THE TEST RESULTS °It is your responsibility to obtain your test results. Ask the lab or department performing the test when and how you will get your results. °Document Released: 07/04/2004 Document Revised: 09/04/2011 Document Reviewed: 05/17/2008 °ExitCare® Patient Information ©2015 [employer]. This information is not intended to replace advice given to you by your health care provider. Make sure you discuss any questions you have with your health care provider. ° °

## 2014-06-22 NOTE — Telephone Encounter (Signed)
Skilled nursing orders faxed.  Forms labeled and placed in Dr. Ethel Rana red folder for review and signature.

## 2014-06-23 ENCOUNTER — Telehealth: Payer: Self-pay

## 2014-06-23 NOTE — Telephone Encounter (Signed)
Faxed back to [facility], and sent for scanning.

## 2014-06-23 NOTE — Telephone Encounter (Signed)
Received notification from [place] that Pt refused HHA services on 06/20/2014. Report given to Dr. Larose Kells for review.

## 2014-06-29 ENCOUNTER — Telehealth: Payer: Self-pay | Admitting: Internal Medicine

## 2014-06-29 ENCOUNTER — Ambulatory Visit (HOSPITAL_COMMUNITY): Admission: RE | Admit: 2014-06-29 | Payer: Medicare Other | Source: Ambulatory Visit

## 2014-06-29 DIAGNOSIS — Z8639 Personal history of other endocrine, nutritional and metabolic disease: Secondary | ICD-10-CM | POA: Diagnosis not present

## 2014-06-29 DIAGNOSIS — B182 Chronic viral hepatitis C: Secondary | ICD-10-CM | POA: Diagnosis not present

## 2014-06-29 DIAGNOSIS — K7469 Other cirrhosis of liver: Secondary | ICD-10-CM | POA: Diagnosis not present

## 2014-06-29 DIAGNOSIS — R188 Other ascites: Secondary | ICD-10-CM | POA: Diagnosis not present

## 2014-06-29 DIAGNOSIS — K766 Portal hypertension: Secondary | ICD-10-CM | POA: Diagnosis not present

## 2014-06-29 NOTE — Telephone Encounter (Signed)
That is ok 

## 2014-06-29 NOTE — Telephone Encounter (Signed)
Spoke with Constance Haw, verbal orders given.

## 2014-06-29 NOTE — Telephone Encounter (Signed)
Caller name: [facility] Relation to pt: other Physical Therapist  Call back number: [phone]  Reason for call:  PT requesting verbal orders for twice a week for 4 weeks for home physical therapy and a home health aid to assist in bathing twice a week for 4 weeks.

## 2014-06-29 NOTE — Telephone Encounter (Signed)
Please advise 

## 2014-06-30 ENCOUNTER — Ambulatory Visit (HOSPITAL_COMMUNITY)
Admission: RE | Admit: 2014-06-30 | Discharge: 2014-06-30 | Disposition: A | Discharge status: Home / Self Care | Payer: Medicare Other | Source: Ambulatory Visit | Attending: Gastroenterology | Admitting: Gastroenterology

## 2014-06-30 ENCOUNTER — Encounter (HOSPITAL_COMMUNITY): Payer: Self-pay

## 2014-06-30 ENCOUNTER — Ambulatory Visit (HOSPITAL_COMMUNITY): Payer: Medicare Other

## 2014-06-30 DIAGNOSIS — B1920 Unspecified viral hepatitis C without hepatic coma: Secondary | ICD-10-CM | POA: Diagnosis not present

## 2014-06-30 DIAGNOSIS — R188 Other ascites: Secondary | ICD-10-CM | POA: Insufficient documentation

## 2014-06-30 DIAGNOSIS — K7460 Unspecified cirrhosis of liver: Secondary | ICD-10-CM | POA: Insufficient documentation

## 2014-06-30 MED ORDER — ALBUMIN HUMAN 25 % IV SOLN
25.0000 g | Freq: Once | INTRAVENOUS | Status: AC
  Administered 2014-06-30: 25 g via INTRAVENOUS
  Filled 2014-06-30: qty 100

## 2014-06-30 MED ORDER — SODIUM CHLORIDE 0.9 % IV SOLN
Freq: Once | INTRAVENOUS | Status: AC
  Administered 2014-06-30: 13:00:00 via INTRAVENOUS

## 2014-06-30 NOTE — Procedures (Signed)
US guided therapeutic paracentesis performed yielding 4 liters blood- tinged ,amber fluid. No immediate complications. The pt will receive IV albumin postprocedure.

## 2014-07-06 ENCOUNTER — Other Ambulatory Visit: Payer: Self-pay | Admitting: Gastroenterology

## 2014-07-06 DIAGNOSIS — K7469 Other cirrhosis of liver: Secondary | ICD-10-CM | POA: Diagnosis not present

## 2014-07-06 DIAGNOSIS — K7290 Hepatic failure, unspecified without coma: Secondary | ICD-10-CM | POA: Diagnosis not present

## 2014-07-06 DIAGNOSIS — E871 Hypo-osmolality and hyponatremia: Secondary | ICD-10-CM | POA: Diagnosis not present

## 2014-07-06 DIAGNOSIS — B182 Chronic viral hepatitis C: Secondary | ICD-10-CM | POA: Diagnosis not present

## 2014-07-06 DIAGNOSIS — R188 Other ascites: Secondary | ICD-10-CM

## 2014-07-07 ENCOUNTER — Other Ambulatory Visit: Payer: Self-pay | Admitting: Internal Medicine

## 2014-07-09 ENCOUNTER — Encounter (HOSPITAL_COMMUNITY)
Admission: RE | Admit: 2014-07-09 | Discharge: 2014-07-09 | Disposition: A | Discharge status: Home / Self Care | Payer: Medicare Other | Source: Ambulatory Visit | Attending: Gastroenterology | Admitting: Gastroenterology

## 2014-07-09 ENCOUNTER — Ambulatory Visit (HOSPITAL_COMMUNITY)
Admission: RE | Admit: 2014-07-09 | Discharge: 2014-07-09 | Disposition: A | Discharge status: Home / Self Care | Payer: Medicare Other | Source: Ambulatory Visit | Attending: Gastroenterology | Admitting: Gastroenterology

## 2014-07-09 DIAGNOSIS — K7460 Unspecified cirrhosis of liver: Secondary | ICD-10-CM | POA: Diagnosis not present

## 2014-07-09 DIAGNOSIS — R188 Other ascites: Secondary | ICD-10-CM

## 2014-07-09 DIAGNOSIS — B1920 Unspecified viral hepatitis C without hepatic coma: Secondary | ICD-10-CM | POA: Diagnosis not present

## 2014-07-09 MED ORDER — SODIUM CHLORIDE 0.9 % IV SOLN: Freq: Once | INTRAVENOUS | Status: DC

## 2014-07-09 MED ORDER — ALBUMIN HUMAN 25 % IV SOLN
25.0000 g | Freq: Once | INTRAVENOUS | Status: AC
  Administered 2014-07-09: 25 g via INTRAVENOUS
  Filled 2014-07-09: qty 100

## 2014-07-09 NOTE — Discharge Instructions (Signed)
Paracentesis °Paracentesis is a procedure used to remove excess fluid from the belly (abdomen). Excess fluid in the belly is called ascites. Excess fluid can be the result of certain conditions, such as infection, inflammation, abdominal injury, heart failure, chronic scarring of the liver (cirrhosis), or cancer. The excess fluid is removed using a needle inserted through the skin and tissue into the abdomen.  °A paracentesis may be done to: °· Determine the cause of the excess fluid through examination of the fluid. °· Relieve symptoms of shortness of breath or pain caused by the excess fluid. °· Determine presence of bleeding after an abdominal injury. °LET YOUR CAREGIVERS KNOW ABOUT: °· Allergies. °· Medications taken including herbs, eye drops, over-the-counter medications, and creams. °· Use of steroids (by mouth or creams). °· Previous problems with anesthetics or numbing medicine. °· Possibility of pregnancy, if this applies. °· History of blood clots (thrombophlebitis). °· History of bleeding or blood problems. °· Previous surgery. °· Other health problems. °RISKS AND COMPLICATIONS °· Injury to an abdominal organ, such as the bowel (large intestine), liver, spleen, or bladder. °· Possible infection. °· Bleeding. °· Low blood pressure (hypotension). °BEFORE THE PROCEDURE °This is a procedure that can be done as an outpatient. Confirm the time that you need to arrive for your procedure. A blood sample may be done to determine your blood clotting time. The presence of a severe bleeding disorder (coagulopathy) which cannot be promptly corrected may make this procedure inadvisable. You may be asked to urinate. °PROCEDURE °The procedure will take about 30 minutes. This time will vary depending on the amount of fluid that is removed. You may be asked to lie on your back with your head elevated. An area on your abdomen will be cleansed. A numbing medicine may then be injected (local anesthesia) into the skin and  tissue. A needle is inserted through your abdominal skin and tissues until it is positioned in your abdomen. You may feel pressure or slight pain as the needle is positioned into the abdomen. Fluid is removed from the abdomen through the needle. Tell your caregiver if you feel dizzy or lightheaded. The needle is withdrawn once the desired amount of fluid has been removed. A sample of the fluid may be sent for examination.  °AFTER THE PROCEDURE °Your recovery will be assessed and monitored. If there are no problems, as an outpatient, you should be able to go home shortly after the procedure. There may be a very limited amount of clear fluid draining from the needle insertion site over the next 2 days. Confirm with your caregiver as to the expected amount of drainage. °Obtaining the Test Results °It is your responsibility to obtain your test results. Do not assume everything is normal if you have not heard from your caregiver or the medical facility. It is important for you to follow up on all of your test results. °HOME CARE INSTRUCTIONS  °· You may resume normal diet and activities as directed or allowed. °· Only take over-the-counter or prescription medicines for pain, discomfort, or fever as directed by your caregiver. °SEEK IMMEDIATE MEDICAL CARE IF: °· You develop shortness of breath or chest pain. °· You develop increasing pain, discomfort, or swelling in your abdomen. °· You develop new drainage or pus coming from site where fluid was removed. °· You develop swelling or increased redness from site where fluid was removed. °· You develop an unexplained temperature of 102° F (38.9° C) or above. °Document Released: 12/26/2004 Document Revised: 09/04/2011 Document   Reviewed: 02/01/2009 Exit[employer] Patient Information 2015 [place], [place]. This information is not intended to replace advice given to you by your health care provider. Make sure you discuss any questions you have with your health care provider. Albumin   What is this medicine? ALBUMIN (al BYOO min) is used to treat or prevent shock following serious injury, bleeding, surgery, or burns by increasing the volume of blood plasma. This medicine can also replace low blood protein. This medicine may be used for other purposes; ask your health care provider or pharmacist if you have questions. COMMON BRAND NAME(S): Albuked, Albumarc, Albuminar, Albutein, Buminate, Flexbumin, Kedbumin, Macrotec, Plasbumin What should I tell my health care provider before I take this medicine? They need to know if you have any of the following conditions: -anemia -heart disease -kidney disease -an unusual or allergic reaction to albumin, other medicines, foods, dyes, or preservatives -pregnant or trying to get pregnant -breast-feeding How should I use this medicine? This medicine is for infusion into a vein. It is given by a health-care professional in a hospital or clinic. Talk to your pediatrician regarding the use of this medicine in children. While this drug may be prescribed for selected conditions, precautions do apply. Overdosage: If you think you have taken too much of this medicine contact a poison control center or emergency room at once. NOTE: This medicine is only for you. Do not share this medicine with others. What if I miss a dose? This does not apply. What may interact with this medicine? Interactions are not expected. This list may not describe all possible interactions. Give your health care provider a list of all the medicines, herbs, non-prescription drugs, or dietary supplements you use. Also tell them if you smoke, drink alcohol, or use illegal drugs. Some items may interact with your medicine. What should I watch for while using this medicine? Your condition will be closely monitored while you receive this medicine. Some products are derived from human plasma, and there is a small risk that these products may contain certain types of virus or  bacteria. All products are processed to kill most viruses and bacteria. If you have questions concerning the risk of infections, discuss them with your doctor or health care professional. What side effects may I notice from receiving this medicine? Side effects that you should report to your doctor or health care professional as soon as possible: -allergic reactions like skin rash, itching or hives, swelling of the face, lips, or tongue -breathing problems -changes in heartbeat -fever, chills -pain, redness or swelling at the injection site -signs of viral infection including fever, drowsiness, chills, runny nose followed in about 2 weeks by a rash and joint pain -tightness in the chest Side effects that usually do not require medical attention (report to your doctor or health care professional if they continue or are bothersome): -increased salivation -nausea, vomiting This list may not describe all possible side effects. Call your doctor for medical advice about side effects. You may report side effects to FDA at 1-800-FDA-1088. Where should I keep my medicine? This does not apply. You will not be given this medicine to store at home. NOTE: This sheet is a summary. It may not cover all possible information. If you have questions about this medicine, talk to your doctor, pharmacist, or health care provider.  2015, [employer]. (2007-09-05 10:18:55)  Ascites Ascites is a gathering of fluid in the belly (abdomen). This is most often caused by liver disease. It may also be caused  by a number of other less common problems. It causes a ballooning out (distension) of the abdomen. CAUSES  Scarring of the liver (cirrhosis) is the most common cause of ascites. Other causes include:  Infection or inflammation in the abdomen.  Cancer in the abdomen.  Heart failure.  Certain forms of kidney failure (nephritic syndrome).  Inflammation of the pancreas.  Clots in the veins of the  liver. SYMPTOMS  In the early stages of ascites, you may not have any symptoms. The main symptom of ascites is a sense of abdominal bloating. This is due to the presence of fluid. This may also cause an increase in abdominal or waist size. People with this condition can develop swelling in the legs, and men can develop a swollen scrotum. When there is a lot of fluid, it may be hard to breath. Stretching of the abdomen by fluid can be painful. DIAGNOSIS  Certain features of your medical history, such as a history of liver disease and of an enlarging abdomen, can suggest the presence of ascites. The diagnosis of ascites can be made on physical exam by your caregiver. An abdominal ultrasound examination can confirm that ascites is present, and estimate the amount of fluid. Once ascites is confirmed, it is important to determine its cause. Again, a history of one of the conditions listed in "CAUSES" provides a strong clue. A physical exam is important, and blood and X-ray tests may be needed. During a procedure called paracentesis, a sample of fluid is removed from the abdomen. This can determine certain key features about the fluid, such as whether or not infection or cancer is present. Your caregiver will determine if a paracentesis is necessary. They will describe the procedure to you. PREVENTION  Ascites is a complication of other conditions. Therefore to prevent ascites, you must seek treatment for any significant health conditions you have. Once ascites is present, careful attention to fluid and salt intake may help prevent it from getting worse. If you have ascites, you should not drink alcohol. PROGNOSIS  The prognosis of ascites depends on the underlying disease. If the disease is reversible, such as with certain infections or with heart failure, then ascites may improve or disappear. When ascites is caused by cirrhosis, then it indicates that the liver disease has worsened, and further evaluation and  treatment of the liver disease is needed. If your ascites is caused by cancer, then the success or failure of the cancer treatment will determine whether your ascites will improve or worsen. RISKS AND COMPLICATIONS  Ascites is likely to worsen if it is not properly diagnosed and treated. A large amount of ascites can cause pain and difficulty breathing. The main complication, besides worsening, is infection (called spontaneous bacterial peritonitis). This requires prompt treatment. TREATMENT  The treatment of ascites depends on its cause. When liver disease is your cause, medical management using water pills (diuretics) and decreasing salt intake is often effective. Ascites due to peritoneal inflammation or malignancy (cancer) alone does not respond to salt restriction and diuretics. Hospitalization is sometimes required. If the treatment of ascites cannot be managed with medications, a number of other treatments are available. Your caregivers will help you decide which will work best for you. Some of these are:  Removal of fluid from the abdomen (paracentesis).  Fluid from the abdomen is passed into a vein (peritoneovenous shunting).  Liver transplantation.  Transjugular intrahepatic portosystemic stent shunt. HOME CARE INSTRUCTIONS  It is important to monitor body weight and the intake  and output of fluids. Weigh yourself at the same time every day. Record your weights. Fluid restriction may be necessary. It is also important to know your salt intake. The more salt you take in, the more fluid you will retain. Ninety percent of people with ascites respond to this approach.  Follow any directions for medicines carefully.  Follow up with your caregiver, as directed.  Report any changes in your health, especially any new or worsening symptoms.  If your ascites is from liver disease, avoid alcohol and other substances toxic to the liver. SEEK MEDICAL CARE IF:   Your weight increases more than  a few pounds in a few days.  Your abdominal or waist size increases.  You develop swelling in your legs.  You had swelling and it worsens. SEEK IMMEDIATE MEDICAL CARE IF:   You develop a fever.  You develop new abdominal pain.  You develop difficulty breathing.  You develop confusion.  You have bleeding from the mouth, stomach, or rectum. MAKE SURE YOU:   Understand these instructions.  Will watch your condition.  Will get help right away if you are not doing well or get worse. Document Released: 06/12/2005 Document Revised: 09/04/2011 Document Reviewed: 01/11/2007 [facility] Patient Information 2015 [place], [place]. This information is not intended to replace advice given to you by your health care provider. Make sure you discuss any questions you have with your health care provider.

## 2014-07-09 NOTE — Procedures (Signed)
Successful US guided paracentesis from RLQ.  Yielded 4.6 liters of serous/amber fluid.  No immediate complications.  Pt tolerated well.   Specimen was not sent for labs.  Tsosie Billing D PA-C 07/09/2014 11:47 AM

## 2014-07-13 ENCOUNTER — Other Ambulatory Visit: Payer: Self-pay | Admitting: Gastroenterology

## 2014-07-13 DIAGNOSIS — R188 Other ascites: Secondary | ICD-10-CM

## 2014-07-17 ENCOUNTER — Encounter (HOSPITAL_COMMUNITY)
Admission: RE | Admit: 2014-07-17 | Discharge: 2014-07-17 | Disposition: A | Discharge status: Home / Self Care | Payer: Medicare Other | Source: Ambulatory Visit | Attending: Gastroenterology | Admitting: Gastroenterology

## 2014-07-17 ENCOUNTER — Ambulatory Visit (HOSPITAL_COMMUNITY)
Admission: RE | Admit: 2014-07-17 | Discharge: 2014-07-17 | Disposition: A | Discharge status: Home / Self Care | Payer: Medicare Other | Source: Ambulatory Visit | Attending: Gastroenterology | Admitting: Gastroenterology

## 2014-07-17 ENCOUNTER — Encounter (HOSPITAL_COMMUNITY): Payer: Self-pay

## 2014-07-17 DIAGNOSIS — R188 Other ascites: Secondary | ICD-10-CM | POA: Insufficient documentation

## 2014-07-17 MED ORDER — ALBUMIN HUMAN 25 % IV SOLN
25.0000 g | Freq: Once | INTRAVENOUS | Status: AC
  Administered 2014-07-17: 25 g via INTRAVENOUS

## 2014-07-17 MED ORDER — SODIUM CHLORIDE 0.9 % IV SOLN
INTRAVENOUS | Status: DC
  Administered 2014-07-17: 12:00:00 via INTRAVENOUS

## 2014-07-17 MED ORDER — ALBUMIN HUMAN 25 % IV SOLN
25.0000 g | Freq: Once | INTRAVENOUS | Status: DC
  Filled 2014-07-17: qty 100

## 2014-07-17 NOTE — Procedures (Signed)
Successful US guided paracentesis from RUQ.  Yielded 4 liters of serous fluid.  No immediate complications.  Pt tolerated well.  Post procedure IV Albumin was ordered.  Specimen was not sent for labs.  Tsosie Billing D PA-C 07/17/2014 11:17 AM

## 2014-07-17 NOTE — Discharge Instructions (Signed)
Paracentesis °Paracentesis is a procedure used to remove excess fluid from the belly (abdomen). Excess fluid in the belly is called ascites. Excess fluid can be the result of certain conditions, such as infection, inflammation, abdominal injury, heart failure, chronic scarring of the liver (cirrhosis), or cancer. The excess fluid is removed using a needle inserted through the skin and tissue into the abdomen.  °A paracentesis may be done to: °· Determine the cause of the excess fluid through examination of the fluid. °· Relieve symptoms of shortness of breath or pain caused by the excess fluid. °· Determine presence of bleeding after an abdominal injury. °LET YOUR CAREGIVERS KNOW ABOUT: °· Allergies. °· Medications taken including herbs, eye drops, over-the-counter medications, and creams. °· Use of steroids (by mouth or creams). °· Previous problems with anesthetics or numbing medicine. °· Possibility of pregnancy, if this applies. °· History of blood clots (thrombophlebitis). °· History of bleeding or blood problems. °· Previous surgery. °· Other health problems. °RISKS AND COMPLICATIONS °· Injury to an abdominal organ, such as the bowel (large intestine), liver, spleen, or bladder. °· Possible infection. °· Bleeding. °· Low blood pressure (hypotension). °BEFORE THE PROCEDURE °This is a procedure that can be done as an outpatient. Confirm the time that you need to arrive for your procedure. A blood sample may be done to determine your blood clotting time. The presence of a severe bleeding disorder (coagulopathy) which cannot be promptly corrected may make this procedure inadvisable. You may be asked to urinate. °PROCEDURE °The procedure will take about 30 minutes. This time will vary depending on the amount of fluid that is removed. You may be asked to lie on your back with your head elevated. An area on your abdomen will be cleansed. A numbing medicine may then be injected (local anesthesia) into the skin and  tissue. A needle is inserted through your abdominal skin and tissues until it is positioned in your abdomen. You may feel pressure or slight pain as the needle is positioned into the abdomen. Fluid is removed from the abdomen through the needle. Tell your caregiver if you feel dizzy or lightheaded. The needle is withdrawn once the desired amount of fluid has been removed. A sample of the fluid may be sent for examination.  °AFTER THE PROCEDURE °Your recovery will be assessed and monitored. If there are no problems, as an outpatient, you should be able to go home shortly after the procedure. There may be a very limited amount of clear fluid draining from the needle insertion site over the next 2 days. Confirm with your caregiver as to the expected amount of drainage. °Obtaining the Test Results °It is your responsibility to obtain your test results. Do not assume everything is normal if you have not heard from your caregiver or the medical facility. It is important for you to follow up on all of your test results. °HOME CARE INSTRUCTIONS  °· You may resume normal diet and activities as directed or allowed. °· Only take over-the-counter or prescription medicines for pain, discomfort, or fever as directed by your caregiver. °SEEK IMMEDIATE MEDICAL CARE IF: °· You develop shortness of breath or chest pain. °· You develop increasing pain, discomfort, or swelling in your abdomen. °· You develop new drainage or pus coming from site where fluid was removed. °· You develop swelling or increased redness from site where fluid was removed. °· You develop an unexplained temperature of 102° F (38.9° C) or above. °Document Released: 12/26/2004 Document Revised: 09/04/2011 Document   Reviewed: 02/01/2009 °ExitCare® Patient Information ©2015 [employer]. This information is not intended to replace advice given to you by your health care provider. Make sure you discuss any questions you have with your health care provider. ° °

## 2014-07-23 ENCOUNTER — Ambulatory Visit (HOSPITAL_COMMUNITY)
Admission: RE | Admit: 2014-07-23 | Discharge: 2014-07-23 | Disposition: A | Discharge status: Home / Self Care | Payer: Medicare Other | Source: Ambulatory Visit | Attending: Gastroenterology | Admitting: Gastroenterology

## 2014-07-23 ENCOUNTER — Encounter (HOSPITAL_COMMUNITY)
Admission: RE | Admit: 2014-07-23 | Discharge: 2014-07-23 | Disposition: A | Discharge status: Home / Self Care | Payer: Medicare Other | Source: Ambulatory Visit | Attending: Gastroenterology | Admitting: Gastroenterology

## 2014-07-23 DIAGNOSIS — K7460 Unspecified cirrhosis of liver: Secondary | ICD-10-CM | POA: Insufficient documentation

## 2014-07-23 DIAGNOSIS — B1920 Unspecified viral hepatitis C without hepatic coma: Secondary | ICD-10-CM | POA: Diagnosis not present

## 2014-07-23 DIAGNOSIS — R188 Other ascites: Secondary | ICD-10-CM | POA: Diagnosis not present

## 2014-07-23 NOTE — Procedures (Signed)
US guided therapeutic paracentesis performed yielding 1.2 liters blood-tinged fluid. No immediate complications.

## 2014-07-29 ENCOUNTER — Encounter (HOSPITAL_COMMUNITY): Payer: Self-pay | Admitting: Emergency Medicine

## 2014-07-29 ENCOUNTER — Inpatient Hospital Stay (HOSPITAL_COMMUNITY)
Admission: EM | Admit: 2014-07-29 | Discharge: 2014-08-05 | DRG: 641 | Disposition: A | Discharge status: Home / Self Care | Payer: Medicare Other | Attending: Internal Medicine | Admitting: Internal Medicine

## 2014-07-29 DIAGNOSIS — E875 Hyperkalemia: Secondary | ICD-10-CM | POA: Diagnosis not present

## 2014-07-29 DIAGNOSIS — D696 Thrombocytopenia, unspecified: Secondary | ICD-10-CM | POA: Diagnosis not present

## 2014-07-29 DIAGNOSIS — K589 Irritable bowel syndrome without diarrhea: Secondary | ICD-10-CM | POA: Diagnosis not present

## 2014-07-29 DIAGNOSIS — E43 Unspecified severe protein-calorie malnutrition: Secondary | ICD-10-CM | POA: Diagnosis present

## 2014-07-29 DIAGNOSIS — K7460 Unspecified cirrhosis of liver: Secondary | ICD-10-CM | POA: Diagnosis not present

## 2014-07-29 DIAGNOSIS — I951 Orthostatic hypotension: Secondary | ICD-10-CM | POA: Diagnosis not present

## 2014-07-29 DIAGNOSIS — C220 Liver cell carcinoma: Secondary | ICD-10-CM | POA: Diagnosis not present

## 2014-07-29 DIAGNOSIS — F1020 Alcohol dependence, uncomplicated: Secondary | ICD-10-CM | POA: Diagnosis present

## 2014-07-29 DIAGNOSIS — G4730 Sleep apnea, unspecified: Secondary | ICD-10-CM | POA: Diagnosis present

## 2014-07-29 DIAGNOSIS — K7469 Other cirrhosis of liver: Secondary | ICD-10-CM | POA: Diagnosis not present

## 2014-07-29 DIAGNOSIS — E869 Volume depletion, unspecified: Secondary | ICD-10-CM | POA: Diagnosis not present

## 2014-07-29 DIAGNOSIS — D689 Coagulation defect, unspecified: Secondary | ICD-10-CM | POA: Diagnosis not present

## 2014-07-29 DIAGNOSIS — K766 Portal hypertension: Secondary | ICD-10-CM | POA: Diagnosis not present

## 2014-07-29 DIAGNOSIS — Z7682 Awaiting organ transplant status: Secondary | ICD-10-CM

## 2014-07-29 DIAGNOSIS — Z6820 Body mass index (BMI) 20.0-20.9, adult: Secondary | ICD-10-CM | POA: Diagnosis not present

## 2014-07-29 DIAGNOSIS — I10 Essential (primary) hypertension: Secondary | ICD-10-CM | POA: Diagnosis present

## 2014-07-29 DIAGNOSIS — Z91018 Allergy to other foods: Secondary | ICD-10-CM | POA: Diagnosis not present

## 2014-07-29 DIAGNOSIS — F418 Other specified anxiety disorders: Secondary | ICD-10-CM | POA: Diagnosis not present

## 2014-07-29 DIAGNOSIS — R188 Other ascites: Secondary | ICD-10-CM

## 2014-07-29 DIAGNOSIS — D72829 Elevated white blood cell count, unspecified: Secondary | ICD-10-CM | POA: Diagnosis not present

## 2014-07-29 DIAGNOSIS — B182 Chronic viral hepatitis C: Secondary | ICD-10-CM | POA: Diagnosis present

## 2014-07-29 DIAGNOSIS — D638 Anemia in other chronic diseases classified elsewhere: Secondary | ICD-10-CM | POA: Diagnosis present

## 2014-07-29 DIAGNOSIS — E871 Hypo-osmolality and hyponatremia: Secondary | ICD-10-CM | POA: Diagnosis not present

## 2014-07-29 DIAGNOSIS — R531 Weakness: Secondary | ICD-10-CM | POA: Diagnosis not present

## 2014-07-29 DIAGNOSIS — E876 Hypokalemia: Secondary | ICD-10-CM | POA: Diagnosis not present

## 2014-07-29 DIAGNOSIS — E8770 Fluid overload, unspecified: Secondary | ICD-10-CM | POA: Diagnosis not present

## 2014-07-29 LAB — COMPREHENSIVE METABOLIC PANEL
ALT: 33 U/L (ref 0–53)
AST: 42 U/L — AB (ref 0–37)
Albumin: 2.4 g/dL — ABNORMAL LOW (ref 3.5–5.2)
Alkaline Phosphatase: 105 U/L (ref 39–117)
Anion gap: 6 (ref 5–15)
BUN: 13 mg/dL (ref 6–23)
CO2: 21 mmol/L (ref 19–32)
CREATININE: 0.65 mg/dL (ref 0.50–1.35)
Calcium: 7.9 mg/dL — ABNORMAL LOW (ref 8.4–10.5)
Chloride: 89 mmol/L — ABNORMAL LOW (ref 96–112)
GFR calc Af Amer: >90 mL/min (ref >90)
GFR calc non Af Amer: >90 mL/min (ref >90)
Glucose, Bld: 102 mg/dL — ABNORMAL HIGH (ref 70–99)
Potassium: 5.6 mmol/L — ABNORMAL HIGH (ref 3.5–5.1)
Sodium: 116 mmol/L — CL (ref 135–145)
Total Bilirubin: 5.2 mg/dL — ABNORMAL HIGH (ref 0.3–1.2)
Total Protein: 5.6 g/dL — ABNORMAL LOW (ref 6.0–8.3)

## 2014-07-29 LAB — CBC WITH DIFFERENTIAL/PLATELET
BASOS ABS: 0 10*3/uL (ref 0.0–0.1)
Basophils Relative: 0 % (ref 0–1)
EOS PCT: 1 % (ref 0–5)
Eosinophils Absolute: 0.1 10*3/uL (ref 0.0–0.7)
HEMATOCRIT: 25.8 % — AB (ref 39.0–52.0)
Hemoglobin: 8.9 g/dL — ABNORMAL LOW (ref 13.0–17.0)
LYMPHS ABS: 1 10*3/uL (ref 0.7–4.0)
Lymphocytes Relative: 8 % — ABNORMAL LOW (ref 12–46)
MCH: 31.7 pg (ref 26.0–34.0)
MCHC: 34.5 g/dL (ref 30.0–36.0)
MCV: 91.8 fL (ref 78.0–100.0)
MONO ABS: 1 10*3/uL (ref 0.1–1.0)
Monocytes Relative: 8 % (ref 3–12)
NEUTROS ABS: 10.8 10*3/uL — AB (ref 1.7–7.7)
NEUTROS PCT: 83 % — AB (ref 43–77)
PLATELETS: 64 10*3/uL — AB (ref 150–400)
RBC: 2.81 MIL/uL — ABNORMAL LOW (ref 4.22–5.81)
RDW: 17.4 % — AB (ref 11.5–15.5)
WBC: 12.9 10*3/uL — AB (ref 4.0–10.5)

## 2014-07-29 LAB — AMMONIA: Ammonia: 13 umol/L (ref 11–32)

## 2014-07-29 LAB — ETHANOL: Alcohol, Ethyl (B): <5 mg/dL (ref 0–9)

## 2014-07-29 MED ORDER — SODIUM POLYSTYRENE SULFONATE 15 GM/60ML PO SUSP
15.0000 g | Freq: Once | ORAL | Status: AC
  Administered 2014-07-29: 15 g via ORAL
  Filled 2014-07-29: qty 60

## 2014-07-29 MED ORDER — SODIUM CHLORIDE 0.9 % IV BOLUS (SEPSIS)
1000.0000 mL | Freq: Once | INTRAVENOUS | Status: AC
  Administered 2014-07-29: 1000 mL via INTRAVENOUS

## 2014-07-29 NOTE — ED Notes (Signed)
MD at bedside. Admitting  

## 2014-07-29 NOTE — ED Provider Notes (Signed)
CSN: [id number]     Arrival date & time 07/29/14  2121 History   First MD Initiated Contact with Patient 07/29/14 2201     Chief Complaint  Patient presents with  . low sodium      (Consider location/radiation/quality/duration/timing/severity/associated sxs/prior Treatment) The history is provided by the patient.  Bradley Woods is a 59 y.o. male hx of hep C, anxiety, ulcerative colitis, on liver transplant list, who presented with weakness, dehydration, hyponatremia. He's been having diffuse weakness for the last several days. Has been having poor appetite as well and denies any vomiting or diarrhea. Denies any seizure activity. He went to his transplant center and is labs drawn which showed sodium of 116. He was told to come to the ER for evaluation. He states that he has had this in the past. Of note he had a therapeutic paracentesis a week ago.    Past Medical History  Diagnosis Date  . Hepatitis C   . Anxiety     dx w/ depression 07/2008  . Colitis   . Alcohol abuse, in remission   . Headache(784.0)   . Allergic rhinitis   . Hepatic failure due to alcoholism   . Ulcerative colitis   . Coagulopathy 01/21/2014  . Severe protein-calorie malnutrition 01/21/2014  . Depression with anxiety 04/30/2014  . Ascites 01/21/2014  . ALLERGIC RHINITIS 04/28/2008    Qualifier: Diagnosis of  By: Larose Kells MD, [place] 04/04/2007    Annotation: cscope 08-2005 (had diarrhea) Qualifier: Diagnosis of  By: Larose Kells MD, [place] HYPERSOMNIA, ASSOCIATED WITH SLEEP APNEA 09/20/2009    Qualifier: Diagnosis of  By: Elsworth Soho MD, [place], HX OF 06/15/2009    Qualifier: Diagnosis of  By: Inda Castle FNP, [place]   . HTN (hypertension) 09/26/2010  . Liver failure 01/15/2014  . Hepatic cirrhosis due to chronic hepatitis C infection 01/15/2014  . Anemia 01/19/2014  . Thrombocytopenia 01/19/2014  . Encephalopathy, hepatic 01/21/2014  . Hyponatremia 04/30/2014  . Hypoalbuminemia  04/30/2014   Past Surgical History  Procedure Laterality Date  . Tonsillectomy    . Paracentesis    . Colonoscopy N/A 05/09/2014    Procedure: COLONOSCOPY;  Surgeon: Beryle Beams, MD;  Location: [facility] ENDOSCOPY;  Service: Endoscopy;  Laterality: N/A;   Family History  Problem Relation Age of Onset  . Diabetes      GM  . Coronary artery disease    . Hyperlipidemia    . Prostate cancer    . Colon cancer Neg Hx    History  Substance Use Topics  . Smoking status: Never Smoker   . Smokeless tobacco: Not on file  . Alcohol Use: No     Comment: Quit in 1988.    Review of Systems  Neurological: Positive for weakness.  All other systems reviewed and are negative.     Allergies  Mango flavor  Home Medications   Prior to Admission medications   Medication Sig Start Date End Date Taking? Authorizing Provider  albuterol (PROVENTIL HFA;VENTOLIN HFA) 108 (90 BASE) MCG/ACT inhaler Inhale 2 puffs into the lungs every 6 (six) hours as needed for wheezing or shortness of breath. 05/13/14  Yes Robbie Lis, MD  antiseptic oral rinse (CPC / CETYLPYRIDINIUM CHLORIDE 0.05%) 0.05 % LIQD solution 7 mLs by Mouth Rinse route 2 (two) times daily. 05/13/14  Yes Robbie Lis, MD  furosemide (LASIX) 20 MG tablet Take 1 tablet (  20 mg total) by mouth daily. 05/13/14  Yes Robbie Lis, MD  HYDROmorphone (DILAUDID) 2 MG tablet Take 0.5-1 tablets (1-2 mg total) by mouth every 3 (three) hours as needed for severe pain. 05/26/14  Yes Robbie Lis, MD  lactulose (CHRONULAC) 10 GM/15ML solution Take 30 mLs (20 g total) by mouth daily. 05/13/14  Yes Robbie Lis, MD  mirtazapine (REMERON) 7.5 MG tablet Take 7.5 mg by mouth at bedtime.    Yes Historical Provider, MD  potassium chloride (K-DUR) 10 MEQ tablet Take 1 tablet (10 mEq total) by mouth daily. 05/13/14  Yes Robbie Lis, MD  ciprofloxacin (CIPRO) 500 MG tablet Take 1 tablet (500 mg total) by mouth 2 (two) times daily. Patient not taking: Reported on  07/29/2014 05/13/14   Robbie Lis, MD  nadolol (CORGARD) 20 MG tablet Take 1 tablet (20 mg total) by mouth daily. Patient not taking: Reported on 07/29/2014 05/13/14   Robbie Lis, MD  ondansetron (ZOFRAN) 4 MG tablet Take 1 tablet (4 mg total) by mouth every 6 (six) hours as needed for nausea. Patient not taking: Reported on 07/29/2014 05/13/14   Robbie Lis, MD  pantoprazole (PROTONIX) 40 MG tablet Take 1 tablet (40 mg total) by mouth 2 (two) times daily. Patient not taking: Reported on 07/29/2014 05/13/14   Robbie Lis, MD   BP 115/71 mmHg  Pulse 97  Temp(Src) 97.9 F (36.6 C) (Oral)  Resp 20  SpO2 100% Physical Exam  Constitutional: He is oriented to person, place, and time.  Chronically ill, NAD   HENT:  Head: Normocephalic.  MM dry   Eyes: Conjunctivae are normal. Pupils are equal, round, and reactive to light.  Neck: Normal range of motion. Neck supple.  Cardiovascular: Normal rate, regular rhythm and normal heart sounds.   Pulmonary/Chest: Effort normal and breath sounds normal. No respiratory distress. He has no wheezes. He has no rales.  Abdominal: Soft. Bowel sounds are normal. He exhibits no distension. There is no tenderness. There is no rebound and no guarding.  Musculoskeletal: Normal range of motion.  Neurological: He is alert and oriented to person, place, and time. No cranial nerve deficit. Coordination normal.  ? Tremors, no obvious asterixis   Skin: Skin is warm and dry.  Psychiatric: He has a normal mood and affect. His behavior is normal. Judgment and thought content normal.  Nursing note and vitals reviewed.   ED Course  Procedures (including critical care time) Labs Review Labs Reviewed  CBC WITH DIFFERENTIAL/PLATELET - Abnormal; Notable for the following:    WBC 12.9 (*)    RBC 2.81 (*)    Hemoglobin 8.9 (*)    HCT 25.8 (*)    RDW 17.4 (*)    Platelets 64 (*)    Neutrophils Relative % 83 (*)    Lymphocytes Relative 8 (*)    Neutro Abs 10.8 (*)     All other components within normal limits  COMPREHENSIVE METABOLIC PANEL - Abnormal; Notable for the following:    Sodium 116 (*)    Potassium 5.6 (*)    Chloride 89 (*)    Glucose, Bld 102 (*)    Calcium 7.9 (*)    Total Protein 5.6 (*)    Albumin 2.4 (*)    AST 42 (*)    Total Bilirubin 5.2 (*)    All other components within normal limits  AMMONIA  ETHANOL  URINALYSIS, ROUTINE W REFLEX MICROSCOPIC  OSMOLALITY  OSMOLALITY, URINE  SODIUM, URINE, RANDOM  CREATININE, URINE, RANDOM  I-STAT CHEM 8, ED    Imaging Review No results found.   EKG Interpretation None      MDM   Final diagnoses:  None   Bradley Woods is a 59 y.o. male here with weakness, dehydration, hyponatremia. Appears dehydrated. Will check labs, serum and urine osmolality. I think likely from dehydration so will hydrate patient.   11:29 PM Repeat Sodium is 116. K 5.3, given kayexelate. Will admit to tele.    Wandra Arthurs, MD 07/29/14 2329

## 2014-07-29 NOTE — H&P (Signed)
[facility] History and Physical  [name] XBL:[id number] DOB: 09-Mar-1956 DOA: 07/29/2014   PCP: [name], MD  Specialists: Followed by Dr. Benson Norway here locally. Also followed at the [facility] Liver transplant clinic.  Chief Complaint: Weakness and low sodium levels  HPI: Bradley Woods is a 59 y.o. male with a past medical history of liver cirrhosis, hepatocellular carcinoma, hepatitis C, previous history of alcohol abuse who is currently on the waiting list for liver transplant at [facility]. Patient mentioned that he has had some weakness over the last few days. Denies any nausea, vomiting and diarrhea. He has been dizzy, but denies any syncopal episode. No fever, no chills. He had his ascites tapped on Jan 28. 1.2 L were removed at that time. Abdomen doesn't bother him currently. He went for his usual follow-up at his transplant clinic today and underwent blood work. He came back to [place]. He was called this evening and was told that he had very low sodium levels. It was 114. He was asked to come in to the emergency department. He has been taking his medications regularly. Denies any other complaints.  Home Medications: Prior to Admission medications   Medication Sig Start Date End Date Taking? Authorizing Provider  albuterol (PROVENTIL HFA;VENTOLIN HFA) 108 (90 BASE) MCG/ACT inhaler Inhale 2 puffs into the lungs every 6 (six) hours as needed for wheezing or shortness of breath. 05/13/14  Yes Robbie Lis, MD  antiseptic oral rinse (CPC / CETYLPYRIDINIUM CHLORIDE 0.05%) 0.05 % LIQD solution 7 mLs by Mouth Rinse route 2 (two) times daily. 05/13/14  Yes Robbie Lis, MD  furosemide (LASIX) 20 MG tablet Take 1 tablet (20 mg total) by mouth daily. 05/13/14  Yes Robbie Lis, MD  HYDROmorphone (DILAUDID) 2 MG tablet Take 0.5-1 tablets (1-2 mg total) by mouth every 3 (three) hours as needed for severe pain. 05/26/14  Yes Robbie Lis, MD    lactulose (CHRONULAC) 10 GM/15ML solution Take 30 mLs (20 g total) by mouth daily. 05/13/14  Yes Robbie Lis, MD  mirtazapine (REMERON) 7.5 MG tablet Take 7.5 mg by mouth at bedtime.    Yes Historical Provider, MD  potassium chloride (K-DUR) 10 MEQ tablet Take 1 tablet (10 mEq total) by mouth daily. 05/13/14  Yes Robbie Lis, MD  ciprofloxacin (CIPRO) 500 MG tablet Take 1 tablet (500 mg total) by mouth 2 (two) times daily. Patient not taking: Reported on 07/29/2014 05/13/14   Robbie Lis, MD  nadolol (CORGARD) 20 MG tablet Take 1 tablet (20 mg total) by mouth daily. Patient not taking: Reported on 07/29/2014 05/13/14   Robbie Lis, MD  ondansetron (ZOFRAN) 4 MG tablet Take 1 tablet (4 mg total) by mouth every 6 (six) hours as needed for nausea. Patient not taking: Reported on 07/29/2014 05/13/14   Robbie Lis, MD  pantoprazole (PROTONIX) 40 MG tablet Take 1 tablet (40 mg total) by mouth 2 (two) times daily. Patient not taking: Reported on 07/29/2014 05/13/14   Robbie Lis, MD    Allergies:  Allergies  Allergen Reactions  . Mango Flavor Rash    Severe rash    Past Medical History: Past Medical History  Diagnosis Date  . Hepatitis C   . Anxiety     dx w/ depression 07/2008  . Colitis   . Alcohol abuse, in remission   . Headache(784.0)   . Allergic rhinitis   . Hepatic failure due to alcoholism   .  Ulcerative colitis   . Coagulopathy 01/21/2014  . Severe protein-calorie malnutrition 01/21/2014  . Depression with anxiety 04/30/2014  . Ascites 01/21/2014  . ALLERGIC RHINITIS 04/28/2008    Qualifier: Diagnosis of  By: Larose Kells MD, [place] 04/04/2007    Annotation: cscope 08-2005 (had diarrhea) Qualifier: Diagnosis of  By: Larose Kells MD, [place] HYPERSOMNIA, ASSOCIATED WITH SLEEP APNEA 09/20/2009    Qualifier: Diagnosis of  By: Elsworth Soho MD, [place], HX OF 06/15/2009    Qualifier: Diagnosis of  By: Inda Castle FNP, [place]   . HTN (hypertension)  09/26/2010  . Liver failure 01/15/2014  . Hepatic cirrhosis due to chronic hepatitis C infection 01/15/2014  . Anemia 01/19/2014  . Thrombocytopenia 01/19/2014  . Encephalopathy, hepatic 01/21/2014  . Hyponatremia 04/30/2014  . Hypoalbuminemia 04/30/2014    Past Surgical History  Procedure Laterality Date  . Tonsillectomy    . Paracentesis    . Colonoscopy N/A 05/09/2014    Procedure: COLONOSCOPY;  Surgeon: Beryle Beams, MD;  Location: [facility] ENDOSCOPY;  Service: Endoscopy;  Laterality: N/A;    Social History:  He lives in [place] with his wife. Denies smoking, alcohol use or illicit drug use. He says he has a lot of difficulty getting around due to weakness. Denies using any assistive devices.  Family History:  Family History  Problem Relation Age of Onset  . Diabetes      GM  . Coronary artery disease    . Hyperlipidemia    . Prostate cancer    . Colon cancer Neg Hx     denies any significant health problems in the family  Review of Systems - History obtained from the patient General ROS: positive for  - fatigue Psychological ROS: negative Ophthalmic ROS: negative ENT ROS: negative Allergy and Immunology ROS: negative Hematological and Lymphatic ROS: Easy bruising Endocrine ROS: negative Respiratory ROS: no cough, shortness of breath, or wheezing Cardiovascular ROS: no chest pain or dyspnea on exertion Gastrointestinal ROS: no abdominal pain, change in bowel habits, or black or bloody stools Genito-Urinary ROS: no dysuria, trouble voiding, or hematuria Musculoskeletal ROS: negative Neurological ROS: no TIA or stroke symptoms Dermatological ROS: negative  Physical Examination  Filed Vitals:   07/29/14 2142 07/29/14 2243 07/29/14 2318  BP: 146/120 111/62 115/71  Pulse: 99 96 97  Temp: 98.1 F (36.7 C)  97.9 F (36.6 C)  TempSrc: Oral  Oral  Resp: $Remo'17 16 20  'wbZKN$ SpO2: 100% 100% 100%    BP 115/71 mmHg  Pulse 97  Temp(Src) 97.9 F (36.6 C) (Oral)  Resp 20  SpO2  100%  General appearance: alert, cooperative, appears stated age and no distress Head: Normocephalic, without obvious abnormality, atraumatic Eyes: conjunctivae/corneas clear. PERRL, EOM's intact.  Muddy sclera Throat: Dry mucous membranes Resp: clear to auscultation bilaterally Cardio: regular rate and rhythm, S1, S2 normal, no murmur, click, rub or gallop GI: soft, non-tender; bowel sounds normal; no masses,  no organomegaly Extremities: extremities normal, atraumatic, no cyanosis or edema Pulses: 2+ and symmetric Skin: Bruising noted on arms and legs. No active bleeding. Lymph nodes: Cervical, supraclavicular, and axillary nodes normal. Neurologic: Awake and alert. Oriented 3. No focal neurological deficits.   Laboratory Data: Results for orders placed or performed during the hospital encounter of 07/29/14 (from the past 48 hour(s))  CBC with Differential     Status: Abnormal   Collection Time: 07/29/14 10:09 PM  Result Value Ref Range  WBC 12.9 (H) 4.0 - 10.5 K/uL   RBC 2.81 (L) 4.22 - 5.81 MIL/uL   Hemoglobin 8.9 (L) 13.0 - 17.0 g/dL   HCT 25.8 (L) 39.0 - 52.0 %   MCV 91.8 78.0 - 100.0 fL   MCH 31.7 26.0 - 34.0 pg   MCHC 34.5 30.0 - 36.0 g/dL   RDW 17.4 (H) 11.5 - 15.5 %   Platelets 64 (L) 150 - 400 K/uL    Comment: SPECIMEN CHECKED FOR CLOTS REPEATED TO VERIFY PLATELET COUNT CONFIRMED BY SMEAR    Neutrophils Relative % 83 (H) 43 - 77 %   Lymphocytes Relative 8 (L) 12 - 46 %   Monocytes Relative 8 3 - 12 %   Eosinophils Relative 1 0 - 5 %   Basophils Relative 0 0 - 1 %   Neutro Abs 10.8 (H) 1.7 - 7.7 K/uL   Lymphs Abs 1.0 0.7 - 4.0 K/uL   Monocytes Absolute 1.0 0.1 - 1.0 K/uL   Eosinophils Absolute 0.1 0.0 - 0.7 K/uL   Basophils Absolute 0.0 0.0 - 0.1 K/uL  Comprehensive metabolic panel     Status: Abnormal   Collection Time: 07/29/14 10:09 PM  Result Value Ref Range   Sodium 116 (LL) 135 - 145 mmol/L    Comment: CRITICAL RESULT CALLED TO, READ BACK BY AND  VERIFIED WITH: Candiss Norse RN 2318 07/29/14 A NAVARRO    Potassium 5.6 (H) 3.5 - 5.1 mmol/L   Chloride 89 (L) 96 - 112 mmol/L   CO2 21 19 - 32 mmol/L   Glucose, Bld 102 (H) 70 - 99 mg/dL   BUN 13 6 - 23 mg/dL   Creatinine, Ser 0.65 0.50 - 1.35 mg/dL   Calcium 7.9 (L) 8.4 - 10.5 mg/dL   Total Protein 5.6 (L) 6.0 - 8.3 g/dL   Albumin 2.4 (L) 3.5 - 5.2 g/dL   AST 42 (H) 0 - 37 U/L   ALT 33 0 - 53 U/L   Alkaline Phosphatase 105 39 - 117 U/L   Total Bilirubin 5.2 (H) 0.3 - 1.2 mg/dL   GFR calc non Af Amer >90 >90 mL/min   GFR calc Af Amer >90 >90 mL/min    Comment: (NOTE) The eGFR has been calculated using the CKD EPI equation. This calculation has not been validated in all clinical situations. eGFR's persistently <90 mL/min signify possible Chronic Kidney Disease.    Anion gap 6 5 - 15  Ammonia     Status: None   Collection Time: 07/29/14 10:09 PM  Result Value Ref Range   Ammonia 13 11 - 32 umol/L  Ethanol     Status: None   Collection Time: 07/29/14 10:09 PM  Result Value Ref Range   Alcohol, Ethyl (B) <5 0 - 9 mg/dL    Comment:        LOWEST DETECTABLE LIMIT FOR SERUM ALCOHOL IS 11 mg/dL FOR MEDICAL PURPOSES ONLY     Radiology Reports: No results found.   Problem List  Principal Problem:   Hyponatremia Active Problems:   Thrombocytopenia   Ascites   Coagulopathy   Severe protein-calorie malnutrition   Hepatocellular carcinoma   Liver cirrhosis   Assessment:  This is a 59 year old Caucasian male who comes in with weakness and low sodium levels. He also has hyperkalemia along with hyponatremia. He has chronic hyponatremia with a baseline sodium level between 120-125. Reason for his acute worsening is not entirely clear. ED physician felt that the patient was dehydrated, so  he gave him a liter bolus. Patient has been having more weakness over the last few days. His tongue is dry, so he could be mildly dehydrated.   Plan: #1 Hyponatremia: He has chronic  hyponatremia in the setting of liver cirrhosis. Quite likely that he is dehydrated currently. Gentle IV hydration for now. Utilize caution due to liver cirrhosis and baseline hypervolemia. Sodium levels will be repeated in the morning.  #2 Hyperkalemia: Kayexalate will be given. Potassium level will be repeated in the morning.  #3 Liver cirrhosis with the portal hypertension/hepatocellular carcinoma/hep C/thrombocytopenia: All these are chronic issues. He is on the liver transplant list at [facility]. He is also coagulopathic. All of these appears to be at baseline. He underwent paracentesis on Jan 28. Abdomen is quite benign. May resume his diuretics in the morning.  #4 Normocytic anemia: Appears to be at baseline. No overt bleeding.  #5 leukocytosis: This has been noted before. He is afebrile. UA is pending. No other obvious sources of infection.  DVT Prophylaxis:  SCDs Code Status:  Full code Family Communication:  Discussed with the patient  Disposition Plan:  Admit to telemetry   Further management decisions will depend on results of further testing and patient's response to treatment.   [facility]  [facility] Pager [phone]  If 7PM-7AM, please contact night-coverage [email or web address] Password [facility]  07/29/2014, 11:48 PM

## 2014-07-29 NOTE — ED Notes (Signed)
Gave patient urinal and asked patient to try to give urine sample. Explained why it was important to get that sample.

## 2014-07-29 NOTE — ED Notes (Addendum)
Pt currently on waiting on liver transplant. Pt was seen at [facility] several hours ago and had a sodium of 116. In triage pt weak and unsteady. Pt family members this has happened before. Pt is alert and oriented.

## 2014-07-29 NOTE — ED Notes (Signed)
Critical Lab reported to [place].

## 2014-07-30 ENCOUNTER — Encounter (HOSPITAL_COMMUNITY)
Admission: RE | Admit: 2014-07-30 | Payer: Medicare Other | Source: Ambulatory Visit | Attending: Gastroenterology | Admitting: Gastroenterology

## 2014-07-30 ENCOUNTER — Ambulatory Visit (HOSPITAL_COMMUNITY): Payer: Medicare Other

## 2014-07-30 DIAGNOSIS — C220 Liver cell carcinoma: Secondary | ICD-10-CM

## 2014-07-30 DIAGNOSIS — D689 Coagulation defect, unspecified: Secondary | ICD-10-CM

## 2014-07-30 LAB — COMPREHENSIVE METABOLIC PANEL
ALT: 29 U/L (ref 0–53)
ANION GAP: 6 (ref 5–15)
AST: 36 U/L (ref 0–37)
Albumin: 2.2 g/dL — ABNORMAL LOW (ref 3.5–5.2)
Alkaline Phosphatase: 80 U/L (ref 39–117)
BILIRUBIN TOTAL: 5.4 mg/dL — AB (ref 0.3–1.2)
BUN: 12 mg/dL (ref 6–23)
CO2: 20 mmol/L (ref 19–32)
Calcium: 7.3 mg/dL — ABNORMAL LOW (ref 8.4–10.5)
Chloride: 89 mmol/L — ABNORMAL LOW (ref 96–112)
Creatinine, Ser: 0.59 mg/dL (ref 0.50–1.35)
GFR calc non Af Amer: >90 mL/min (ref >90)
Glucose, Bld: 94 mg/dL (ref 70–99)
Potassium: 4.9 mmol/L (ref 3.5–5.1)
SODIUM: 115 mmol/L — AB (ref 135–145)
Total Protein: 4.7 g/dL — ABNORMAL LOW (ref 6.0–8.3)

## 2014-07-30 LAB — URINALYSIS, ROUTINE W REFLEX MICROSCOPIC
Glucose, UA: NEGATIVE mg/dL
Hgb urine dipstick: NEGATIVE
Ketones, ur: NEGATIVE mg/dL
NITRITE: NEGATIVE
Protein, ur: NEGATIVE mg/dL
Specific Gravity, Urine: 1.024 (ref 1.005–1.030)
Urobilinogen, UA: 4 mg/dL — ABNORMAL HIGH (ref 0.0–1.0)
pH: 6.5 (ref 5.0–8.0)

## 2014-07-30 LAB — BASIC METABOLIC PANEL
ANION GAP: 7 (ref 5–15)
BUN: 12 mg/dL (ref 6–23)
CALCIUM: 7.3 mg/dL — AB (ref 8.4–10.5)
CO2: 20 mmol/L (ref 19–32)
Chloride: 89 mmol/L — ABNORMAL LOW (ref 96–112)
Creatinine, Ser: 0.54 mg/dL (ref 0.50–1.35)
GFR calc Af Amer: >90 mL/min (ref >90)
GFR calc non Af Amer: >90 mL/min (ref >90)
Glucose, Bld: 135 mg/dL — ABNORMAL HIGH (ref 70–99)
POTASSIUM: 4.2 mmol/L (ref 3.5–5.1)
Sodium: 116 mmol/L — CL (ref 135–145)

## 2014-07-30 LAB — GLUCOSE, CAPILLARY: Glucose-Capillary: 121 mg/dL — ABNORMAL HIGH (ref 70–99)

## 2014-07-30 LAB — URINE MICROSCOPIC-ADD ON

## 2014-07-30 LAB — CBC
HEMATOCRIT: 22.5 % — AB (ref 39.0–52.0)
Hemoglobin: 7.8 g/dL — ABNORMAL LOW (ref 13.0–17.0)
MCH: 31.7 pg (ref 26.0–34.0)
MCHC: 34.7 g/dL (ref 30.0–36.0)
MCV: 91.5 fL (ref 78.0–100.0)
PLATELETS: 46 10*3/uL — AB (ref 150–400)
RBC: 2.46 MIL/uL — ABNORMAL LOW (ref 4.22–5.81)
RDW: 17.4 % — ABNORMAL HIGH (ref 11.5–15.5)
WBC: 9.7 10*3/uL (ref 4.0–10.5)

## 2014-07-30 LAB — SODIUM, URINE, RANDOM: SODIUM UR: 55 mmol/L

## 2014-07-30 LAB — CREATININE, URINE, RANDOM: Creatinine, Urine: 114.68 mg/dL

## 2014-07-30 LAB — OSMOLALITY: OSMOLALITY: 242 mosm/kg — AB (ref 275–300)

## 2014-07-30 LAB — OSMOLALITY, URINE: Osmolality, Ur: 680 mOsm/kg (ref 390–1090)

## 2014-07-30 MED ORDER — CETYLPYRIDINIUM CHLORIDE 0.05 % MT LIQD
7.0000 mL | Freq: Two times a day (BID) | OROMUCOSAL | Status: DC
  Administered 2014-07-30 – 2014-08-05 (×7): 7 mL via OROMUCOSAL

## 2014-07-30 MED ORDER — ONDANSETRON HCL 4 MG PO TABS: 4.0000 mg | ORAL_TABLET | Freq: Four times a day (QID) | ORAL | Status: DC | PRN

## 2014-07-30 MED ORDER — CHLORHEXIDINE GLUCONATE 0.12 % MT SOLN
15.0000 mL | Freq: Two times a day (BID) | OROMUCOSAL | Status: DC
  Administered 2014-07-30 – 2014-08-05 (×10): 15 mL via OROMUCOSAL
  Filled 2014-07-30 (×15): qty 15

## 2014-07-30 MED ORDER — SODIUM CHLORIDE 0.9 % IV SOLN
INTRAVENOUS | Status: DC
  Administered 2014-07-30: 14:00:00 via INTRAVENOUS

## 2014-07-30 MED ORDER — SODIUM CHLORIDE 0.9 % IV SOLN
INTRAVENOUS | Status: AC
  Administered 2014-07-30: 01:00:00 via INTRAVENOUS

## 2014-07-30 MED ORDER — MIRTAZAPINE 7.5 MG PO TABS
7.5000 mg | ORAL_TABLET | Freq: Every day | ORAL | Status: DC
  Administered 2014-07-30 – 2014-08-04 (×6): 7.5 mg via ORAL
  Filled 2014-07-30 (×8): qty 1

## 2014-07-30 MED ORDER — ENSURE COMPLETE PO LIQD
237.0000 mL | Freq: Two times a day (BID) | ORAL | Status: DC
  Administered 2014-07-30 (×2): 237 mL via ORAL

## 2014-07-30 MED ORDER — BOOST / RESOURCE BREEZE PO LIQD
1.0000 | Freq: Three times a day (TID) | ORAL | Status: DC
  Administered 2014-07-30 – 2014-08-05 (×5): 1 via ORAL

## 2014-07-30 MED ORDER — LACTULOSE 10 GM/15ML PO SOLN
20.0000 g | Freq: Every day | ORAL | Status: DC
  Administered 2014-07-30 – 2014-08-05 (×7): 20 g via ORAL
  Filled 2014-07-30 (×7): qty 30

## 2014-07-30 MED ORDER — ONDANSETRON HCL 4 MG/2ML IJ SOLN: 4.0000 mg | Freq: Four times a day (QID) | INTRAMUSCULAR | Status: DC | PRN

## 2014-07-30 MED ORDER — SODIUM CHLORIDE 0.9 % IV BOLUS (SEPSIS)
500.0000 mL | Freq: Once | INTRAVENOUS | Status: AC
  Administered 2014-07-30: 500 mL via INTRAVENOUS

## 2014-07-30 MED ORDER — HYDROMORPHONE HCL 2 MG PO TABS: 1.0000 mg | ORAL_TABLET | ORAL | Status: DC | PRN

## 2014-07-30 MED ORDER — SODIUM CHLORIDE 0.9 % IJ SOLN
3.0000 mL | Freq: Two times a day (BID) | INTRAMUSCULAR | Status: DC
  Administered 2014-07-30 – 2014-08-04 (×10): 3 mL via INTRAVENOUS

## 2014-07-30 NOTE — Consult Note (Signed)
Reason for Consult: hyponatremia Referring Physician:  [name] is an 59 y.o. male with a past medical history of liver cirrhosis, hepatocellular carcinoma, hepatitis C, previous history of alcohol abuse who is currently on the waiting list for liver transplant at [facility] ?  Patient mentioned that he has had some weakness over the last few days. Denies any nausea, vomiting or diarrhea. He has been dizzy, but denies any syncopal episode. No fever, no chills. He had his ascites tapped on Jan 28. 1.2 L were removed at that time.  He went for his usual follow-up at his transplant clinic yesterday and underwent blood work. He came back to [place]. He was called yesterday and was told that he had very low sodium levels. It was 114. He was asked to come in to the emergency department. He was almitted last night to [facility] hospital.  The impression was that he was dry so he was hydrated.  This did not lead to an improvement in sodium levels so I was called this PM.  Most recent sodium was 116.  Of note, sodium has been in the 120's really since July of 2015.  He was not on lasix as an OP- but is on lactulose.  He appears to be failing to thrive at this time with weight loss.  He says that he is not eating too well but is drinking. Right now he says that he feels a little better with IVF- has no peripheral edema but does have dependent sacral edema right now.      Trend in Creatinine: CREATININE, SER  Date/Time Value Ref Range Status  07/30/2014 11:40 AM 0.54 0.50 - 1.35 mg/dL Final  07/30/2014 05:40 AM 0.59 0.50 - 1.35 mg/dL Final  07/29/2014 10:09 PM 0.65 0.50 - 1.35 mg/dL Final  05/26/2014 10:23 AM 1.24 0.50 - 1.35 mg/dL Final  05/25/2014 04:50 AM 0.89 0.50 - 1.35 mg/dL Final  05/24/2014 05:49 AM 0.96 0.50 - 1.35 mg/dL Final  05/23/2014 09:17 AM 0.84 0.50 - 1.35 mg/dL Final  05/23/2014 04:49 AM 0.94 0.50 - 1.35 mg/dL Final  05/12/2014 12:09 PM 0.68 0.50 - 1.35 mg/dL  Final  05/10/2014 04:40 AM 0.86 0.50 - 1.35 mg/dL Final  05/09/2014 07:22 PM 0.86 0.50 - 1.35 mg/dL Final  05/09/2014 07:31 AM 0.78 0.50 - 1.35 mg/dL Final  05/08/2014 04:16 AM 0.77 0.50 - 1.35 mg/dL Final  05/07/2014 04:54 AM 0.74 0.50 - 1.35 mg/dL Final  05/04/2014 04:49 AM 0.67 0.50 - 1.35 mg/dL Final  05/03/2014 09:47 AM 0.71 0.50 - 1.35 mg/dL Final  05/02/2014 03:16 PM 0.88 0.50 - 1.35 mg/dL Final  05/02/2014 05:18 AM 0.82 0.50 - 1.35 mg/dL Final  04/30/2014 11:12 AM 0.74 0.50 - 1.35 mg/dL Final  01/21/2014 04:35 AM 0.98 0.50 - 1.35 mg/dL Final  01/20/2014 05:16 AM 0.98 0.50 - 1.35 mg/dL Final  01/19/2014 05:02 AM 0.90 0.50 - 1.35 mg/dL Final  01/18/2014 04:30 AM 0.99 0.50 - 1.35 mg/dL Final  01/17/2014 04:25 AM 0.98 0.50 - 1.35 mg/dL Final  01/16/2014 04:20 AM 0.98 0.50 - 1.35 mg/dL Final  01/15/2014 05:18 PM 0.76 0.50 - 1.35 mg/dL Final  09/26/2010 12:08 PM 0.9 0.4 - 1.5 mg/dL Final  08/06/2009 10:09 AM 1.0 0.4-1.5 mg/dL Final  02/21/2008 01:43 PM 0.9 0.4-1.5 mg/dL Final  01/29/2008 10:36 AM 0.9 0.4-1.5 mg/dL Final  04/04/2007 12:00 AM 0.6 0.4-1.5 mg/dL Final   SODIUM  Date/Time Value Ref Range Status  07/30/2014 11:40 AM 116*  135 - 145 mmol/L Final    Comment:    RESULT REPEATED AND VERIFIED CRITICAL RESULT CALLED TO, READ BACK BY AND VERIFIED WITH: V.BOYD AT 1259 ON 07/30/14 BY S.VANHOORNE   07/30/2014 05:40 AM 115* 135 - 145 mmol/L Final    Comment:    CRITICAL RESULT CALLED TO, READ BACK BY AND VERIFIED WITH: A. MERRITT RN AT 8119 ON 02.04.16 BY SHUEA   07/29/2014 10:09 PM 116* 135 - 145 mmol/L Final    Comment:    CRITICAL RESULT CALLED TO, READ BACK BY AND VERIFIED WITH: T NEILSON RN 1478 07/29/14 A NAVARRO   05/26/2014 10:23 AM 121* 137 - 147 mEq/L Final    Comment:    CRITICAL RESULT CALLED TO, READ BACK BY AND VERIFIED WITH: OSBOURNE,K. RN AT 2956 05/26/14 MULLINS,T   05/25/2014 04:50 AM 120* 137 - 147 mEq/L Final    Comment:    CRITICAL RESULT CALLED  TO, READ BACK BY AND VERIFIED WITH: MALMFELT,J/3W @0550  ON 05/25/14 BY KARCZEWSKI,S.   05/24/2014 05:49 AM 119* 137 - 147 mEq/L Final    Comment:    CRITICAL RESULT CALLED TO, READ BACK BY AND VERIFIED WITH: MALMFELT,J/3W @0627  ON 05/24/14 BY KARCZEWSKI,S.   05/23/2014 09:17 AM 118* 137 - 147 mEq/L Final    Comment:    CRITICAL RESULT CALLED TO, READ BACK BY AND VERIFIED WITH: J JONAS AT 1054 ON 11.28.2015 BY NBROOKS   05/23/2014 04:49 AM 120* 137 - 147 mEq/L Final    Comment:    CRITICAL RESULT CALLED TO, READ BACK BY AND VERIFIED WITH: DENNIS,A RN AT 0641 11.28.15 BY TIBBITTS,K   05/12/2014 12:09 PM 123* 137 - 147 mEq/L Final  05/10/2014 04:40 AM 129* 137 - 147 mEq/L Final  05/09/2014 07:22 PM 126* 137 - 147 mEq/L Final  05/09/2014 07:31 AM 128* 137 - 147 mEq/L Final  05/08/2014 04:16 AM 124* 137 - 147 mEq/L Final  05/07/2014 04:54 AM 125* 137 - 147 mEq/L Final  05/04/2014 04:49 AM 124* 137 - 147 mEq/L Final  05/03/2014 09:47 AM 123* 137 - 147 mEq/L Final  05/02/2014 03:16 PM 128* 137 - 147 mEq/L Final  05/02/2014 05:18 AM 131* 137 - 147 mEq/L Final  04/30/2014 11:12 AM 127* 137 - 147 mEq/L Final  01/21/2014 04:35 AM 130* 137 - 147 mEq/L Final  01/20/2014 05:16 AM 130* 137 - 147 mEq/L Final  01/19/2014 05:02 AM 131* 137 - 147 mEq/L Final  01/18/2014 04:30 AM 131* 137 - 147 mEq/L Final  01/17/2014 04:25 AM 131* 137 - 147 mEq/L Final  01/16/2014 04:20 AM 126* 137 - 147 mEq/L Final  01/15/2014 05:18 PM 124* 137 - 147 mEq/L Final  09/26/2010 12:08 PM 136 135 - 145 mEq/L Final  08/06/2009 10:09 AM 139 135-145 meq/L Final  02/21/2008 01:43 PM 135 135-145 meq/L Final  01/29/2008 10:36 AM 138 135-145 meq/L Final  04/04/2007 12:00 AM 137 135-145 meq/L Final   PMH:   Past Medical History  Diagnosis Date  . Hepatitis C   . Anxiety     dx w/ depression 07/2008  . Colitis   . Alcohol abuse, in remission   . Headache(784.0)   . Allergic rhinitis   . Hepatic failure due to  alcoholism   . Ulcerative colitis   . Coagulopathy 01/21/2014  . Severe protein-calorie malnutrition 01/21/2014  . Depression with anxiety 04/30/2014  . Ascites 01/21/2014  . ALLERGIC RHINITIS 04/28/2008    Qualifier: Diagnosis of  By: Larose Kells MD, [place]  COLITIS 04/04/2007    Annotation: cscope 08-2005 (had diarrhea) Qualifier: Diagnosis of  By: Larose Kells MD, [place] HYPERSOMNIA, ASSOCIATED WITH SLEEP APNEA 09/20/2009    Qualifier: Diagnosis of  By: Elsworth Soho MD, [place], HX OF 06/15/2009    Qualifier: Diagnosis of  By: Inda Castle FNP, [place]   . HTN (hypertension) 09/26/2010  . Liver failure 01/15/2014  . Hepatic cirrhosis due to chronic hepatitis C infection 01/15/2014  . Anemia 01/19/2014  . Thrombocytopenia 01/19/2014  . Encephalopathy, hepatic 01/21/2014  . Hyponatremia 04/30/2014  . Hypoalbuminemia 04/30/2014    PSH:   Past Surgical History  Procedure Laterality Date  . Tonsillectomy    . Paracentesis    . Colonoscopy N/A 05/09/2014    Procedure: COLONOSCOPY;  Surgeon: Beryle Beams, MD;  Location: [facility] ENDOSCOPY;  Service: Endoscopy;  Laterality: N/A;    Allergies:  Allergies  Allergen Reactions  . Mango Flavor Rash    Severe rash    Medications:   Prior to Admission medications   Medication Sig Start Date End Date Taking? Authorizing Provider  albuterol (PROVENTIL HFA;VENTOLIN HFA) 108 (90 BASE) MCG/ACT inhaler Inhale 2 puffs into the lungs every 6 (six) hours as needed for wheezing or shortness of breath. 05/13/14  Yes Robbie Lis, MD  antiseptic oral rinse (CPC / CETYLPYRIDINIUM CHLORIDE 0.05%) 0.05 % LIQD solution 7 mLs by Mouth Rinse route 2 (two) times daily. 05/13/14  Yes Robbie Lis, MD  furosemide (LASIX) 20 MG tablet Take 1 tablet (20 mg total) by mouth daily. 05/13/14  Yes Robbie Lis, MD  HYDROmorphone (DILAUDID) 2 MG tablet Take 0.5-1 tablets (1-2 mg total) by mouth every 3 (three) hours as needed for severe pain. 05/26/14  Yes Robbie Lis, MD  lactulose (CHRONULAC) 10 GM/15ML solution Take 30 mLs (20 g total) by mouth daily. 05/13/14  Yes Robbie Lis, MD  mirtazapine (REMERON) 7.5 MG tablet Take 7.5 mg by mouth at bedtime.    Yes Historical Provider, MD  potassium chloride (K-DUR) 10 MEQ tablet Take 1 tablet (10 mEq total) by mouth daily. 05/13/14  Yes Robbie Lis, MD  ciprofloxacin (CIPRO) 500 MG tablet Take 1 tablet (500 mg total) by mouth 2 (two) times daily. Patient not taking: Reported on 07/29/2014 05/13/14   Robbie Lis, MD  nadolol (CORGARD) 20 MG tablet Take 1 tablet (20 mg total) by mouth daily. Patient not taking: Reported on 07/29/2014 05/13/14   Robbie Lis, MD  ondansetron (ZOFRAN) 4 MG tablet Take 1 tablet (4 mg total) by mouth every 6 (six) hours as needed for nausea. Patient not taking: Reported on 07/29/2014 05/13/14   Robbie Lis, MD  pantoprazole (PROTONIX) 40 MG tablet Take 1 tablet (40 mg total) by mouth 2 (two) times daily. Patient not taking: Reported on 07/29/2014 05/13/14   Robbie Lis, MD    Discontinued Meds:   Medications Discontinued During This Encounter  Medication Reason  . feeding supplement (ENSURE COMPLETE) (ENSURE COMPLETE) liquid 237 mL     Social History:  reports that he has never smoked. He does not have any smokeless tobacco history on file. He reports that he does not drink alcohol or use illicit drugs.  Family History:   Family History  Problem Relation Age of Onset  . Diabetes      GM  . Coronary artery disease    . Hyperlipidemia    . Prostate  cancer    . Colon cancer Neg Hx     A comprehensive review of systems was negative except for: Constitutional: positive for anorexia Cardiovascular: positive for fatigue and dizziness  Blood pressure 108/54, pulse 101, temperature 98.5 F (36.9 C), temperature source Oral, resp. rate 20, SpO2 100 %. General appearance: alert, cachectic and fatigued Eyes: conjunctivae/corneas clear. PERRL, EOM's intact. Fundi  benign. Throat: normal findings: buccal mucosa normal Resp: clear to auscultation bilaterally Chest wall: no tenderness GI: slightly distended but no obvious ascites Extremities: edema only in sacral area  Neurologic: Grossly normal  Labs: Basic Metabolic Panel:  Recent Labs Lab 07/29/14 2209 07/30/14 0540 07/30/14 1140  NA 116* 115* 116*  K 5.6* 4.9 4.2  CL 89* 89* 89*  CO2 21 20 20   GLUCOSE 102* 94 135*  BUN 13 12 12   CREATININE 0.65 0.59 0.54  ALBUMIN 2.4* 2.2*  --   CALCIUM 7.9* 7.3* 7.3*   Liver Function Tests:  Recent Labs Lab 07/29/14 2209 07/30/14 0540  AST 42* 36  ALT 33 29  ALKPHOS 105 80  BILITOT 5.2* 5.4*  PROT 5.6* 4.7*  ALBUMIN 2.4* 2.2*   No results for input(s): LIPASE, AMYLASE in the last 168 hours.  Recent Labs Lab 07/29/14 2209  AMMONIA 13   CBC:  Recent Labs Lab 07/29/14 2209 07/30/14 0540  WBC 12.9* 9.7  NEUTROABS 10.8*  --   HGB 8.9* 7.8*  HCT 25.8* 22.5*  MCV 91.8 91.5  PLT 64* 46*   PT/INR: @labrcntip (inr:5) Cardiac Enzymes: No results for input(s): CKTOTAL, CKMB, CKMBINDEX, TROPONINI in the last 168 hours. CBG: No results for input(s): GLUCAP in the last 168 hours.  Iron Studies: No results for input(s): IRON, TIBC, TRANSFERRIN, FERRITIN in the last 168 hours.  Xrays/Other Studies: No results found.   Assessment/Plan: 59 year old WM with cirrhosis advanced and baseline sodium in the 120's.  He now presents with a lower sodium felt to be in the setting of volume depletion which did not improve with IVF.  1. Hyponatremia- so the usual mechanism for hyponatremia in cirrhosis is hypervolemia and overactive vasopressin and the treatment for that is fluid restriction and at times vasopressin antagonists.  This presentation is a little unusual because he was felt to be volume depleted.  It is OK that he received hydration but now that he has some sacral pitting edema, I am going to stop the continuous fluids and place him on a  fluid restriction.  I will check a urine osm and will continue to follow sodium level.  I anticipate this will be slow to resolve and we much keep in mind that his baseline sodium may only be in the low 120's.   We may consider a trial of samsca if the sodium fails to improve by conservative measures.  It is always so difficult to tell if a person is having symptoms related to low sodium- I do not think that 3% saline is needed at this time given his baseline sodium is not normal.    Thank you for this consult, we will continue to follow with you    Shailyn Weyandt A 07/30/2014, 5:24 PM

## 2014-07-30 NOTE — Progress Notes (Signed)
PT Cancellation Note  Patient Details Name: Bradley Woods MRN: [id number] DOB: [birth year]   Cancelled Treatment:    Reason Eval/Treat Not Completed: Patient declined, no reason specified   [facility] 07/30/2014, 2:30 PM

## 2014-07-30 NOTE — Progress Notes (Signed)
CRITICAL VALUE ALERT  Critical value received:  Na 115  Date of notification:  07/30/14  Time of notification:  0643  Critical value read back:Yes.    Nurse who received alert:  Edwyna Ready RN  MD notified (1st page):  Tylene Fantasia  Time of first page:  0645  MD notified (2nd page):  Time of second page:  Responding MD:    Time MD responded:

## 2014-07-30 NOTE — Progress Notes (Signed)
INITIAL NUTRITION ASSESSMENT  DOCUMENTATION CODES Per approved criteria  -Not Applicable   INTERVENTION: - Resource Breeze po TID, each supplement provides 250 kcal and 9 grams of protein  NUTRITION DIAGNOSIS: Inadequate oral intake related to cirrhosis as evidenced by wt loss.   Goal: Pt to meet >/= 90% of their estimated nutrition needs   Monitor:  Weight trend, po intake, acceptance of supplements, labs  Reason for Assessment: MST  59 y.o. male  Admitting Dx: Hyponatremia  ASSESSMENT: 59 y.o. male with a past medical history of liver cirrhosis, hepatocellular carcinoma, hepatitis C, previous history of alcohol abuse who is currently on the waiting list for liver transplant at [facility]. Patient mentioned that he has had some weakness over the last few days.   - Pt admitted with hyponatremia related to ascites.  - Pt reports a 22 lb wt loss in the past year. He has had a poor appetite for the past couple of weeks. Pt ordered [employer] which he likes. Will send TID. - Pt with mild to moderate muscle wasting of the temples and clavicle.  - Labs reviewed. Na 115, K WNL   Height: Ht Readings from Last 1 Encounters:  07/09/14 5\' 9"  (1.753 m)    Weight: Wt Readings from Last 1 Encounters:  07/09/14 154 lb (69.854 kg)    Ideal Body Weight: 70.7 kg  % Ideal Body Weight: 99%  Wt Readings from Last 10 Encounters:  07/09/14 154 lb (69.854 kg)  05/26/14 162 lb (73.483 kg)  05/11/14 154 lb 6 oz (70.024 kg)  04/30/14 168 lb (76.204 kg)  01/21/14 176 lb 12.9 oz (80.2 kg)  10/06/10 210 lb (95.255 kg)  09/26/10 211 lb 3.2 oz (95.8 kg)  09/28/09 202 lb 3.2 oz (91.717 kg)  09/20/09 206 lb 2.1 oz (93.501 kg)  08/06/09 199 lb 9.6 oz (90.538 kg)   BMI:  There is no weight on file to calculate BMI.  Estimated Nutritional Needs: Kcal: 1750-2000 Protein: 85-95 g Fluid: 1.8-2.0 L/day  Skin: intact  Diet Order: Diet 2 gram sodium  EDUCATION  NEEDS: -Education needs addressed   Intake/Output Summary (Last 24 hours) at 07/30/14 1606 Last data filed at 07/30/14 1527  Gross per 24 hour  Intake    555 ml  Output    101 ml  Net    454 ml    Last BM: 2/4   Labs:   Recent Labs Lab 07/29/14 2209 07/30/14 0540 07/30/14 1140  NA 116* 115* 116*  K 5.6* 4.9 4.2  CL 89* 89* 89*  CO2 21 20 20   BUN 13 12 12   CREATININE 0.65 0.59 0.54  CALCIUM 7.9* 7.3* 7.3*  GLUCOSE 102* 94 135*    CBG (last 3)  No results for input(s): GLUCAP in the last 72 hours.  Scheduled Meds: . antiseptic oral rinse  7 mL Mouth Rinse q12n4p  . chlorhexidine  15 mL Mouth Rinse BID  . feeding supplement (ENSURE COMPLETE)  237 mL Oral BID BM  . lactulose  20 g Oral Daily  . mirtazapine  7.5 mg Oral QHS  . sodium chloride  3 mL Intravenous Q12H    Continuous Infusions: . sodium chloride 75 mL/hr at 07/30/14 1343    Past Medical History  Diagnosis Date  . Hepatitis C   . Anxiety     dx w/ depression 07/2008  . Colitis   . Alcohol abuse, in remission   . Headache(784.0)   . Allergic rhinitis   .  Hepatic failure due to alcoholism   . Ulcerative colitis   . Coagulopathy 01/21/2014  . Severe protein-calorie malnutrition 01/21/2014  . Depression with anxiety 04/30/2014  . Ascites 01/21/2014  . ALLERGIC RHINITIS 04/28/2008    Qualifier: Diagnosis of  By: Larose Kells MD, [place] 04/04/2007    Annotation: cscope 08-2005 (had diarrhea) Qualifier: Diagnosis of  By: Larose Kells MD, Vazquez HYPERSOMNIA, ASSOCIATED WITH SLEEP APNEA 09/20/2009    Qualifier: Diagnosis of  By: Elsworth Soho MD, [place], HX OF 06/15/2009    Qualifier: Diagnosis of  By: Inda Castle FNP, [place] Hampshire   . HTN (hypertension) 09/26/2010  . Liver failure 01/15/2014  . Hepatic cirrhosis due to chronic hepatitis C infection 01/15/2014  . Anemia 01/19/2014  . Thrombocytopenia 01/19/2014  . Encephalopathy, hepatic 01/21/2014  . Hyponatremia 04/30/2014  .  Hypoalbuminemia 04/30/2014    Past Surgical History  Procedure Laterality Date  . Tonsillectomy    . Paracentesis    . Colonoscopy N/A 05/09/2014    Procedure: COLONOSCOPY;  Surgeon: Beryle Beams, MD;  Location: [facility] ENDOSCOPY;  Service: Endoscopy;  Laterality: N/A;    Laurette Schimke MS, RD, LDN

## 2014-07-30 NOTE — Progress Notes (Signed)
[facility] PROGRESS NOTE  Harshil Cavallaro ZOX:[id number] DOB: 1955-09-07 DOA: 07/29/2014 PCP: [name], MD Interim summary: Bradley Woods is a 59 y.o. male with a past medical history of liver cirrhosis, hepatocellular carcinoma, hepatitis C, previous history of alcohol abuse who is currently on the waiting list for liver transplant at [facility]. He was admitted for generalized weakness and he was found to have severe hyponatremia. His baseline sodium is around 120's. On arrival he was found to be dehydrated and volume depleted. He was started on gentle hydration and repeat sodium levels checked. They have been persistently low despite hydration. SIADH work up sent and pending.  Renal consulted for severe hyponatremia.  Assessment/Plan:  Severe hyponatremia in the setting of chronic hyponatremia from liver cirrhosis. Repeat sodium levels on gentle hydration have been persistently low. Renal consulted for recommendations.   Hyperkalemia: resolved with kayexalte.   Liver cirrhosis and hepatocellular carcinoma:  he appears jaundiced and he is also coagulopathic. He is currently waiting on liver transplant at [facility].   Normocytic anemia of chronic disease: Baseline hemoglobin between 8 to 9. Slight drop to 7.8 today. Continue to monitor. No signs of bleeding.   Leukocytosis: Resolved. Probably reactive.   Code Status: full  Family Communication: none at  bedside Disposition Plan: home when sodium levels improve.   Consultants:  renal  Procedures:  none  Antibiotics:  none  HPI/Subjective:still feels week. No nausea or vomiting.   Objective: Filed Vitals:   07/30/14 1305  BP: 108/54  Pulse: 101  Temp: 98.5 F (36.9 C)  Resp: 20    Intake/Output Summary (Last 24 hours) at 07/30/14 1921 Last data filed at 07/30/14 1737  Gross per 24 hour  Intake    555 ml  Output    104 ml  Net    451 ml   There were no vitals filed for this  visit.  Exam:   General:  Alert afebrile comfortable.   Cardiovascular: s1s2  Respiratory: chest clear to auscultation, no wheezing or rhonchi  Abdomen: soft non tender non distended bowel sounds heard.   Musculoskeletal: no pedal edema.   Data Reviewed: Basic Metabolic Panel:  Recent Labs Lab 07/29/14 2209 07/30/14 0540 07/30/14 1140  NA 116* 115* 116*  K 5.6* 4.9 4.2  CL 89* 89* 89*  CO2 21 20 20   GLUCOSE 102* 94 135*  BUN 13 12 12   CREATININE 0.65 0.59 0.54  CALCIUM 7.9* 7.3* 7.3*   Liver Function Tests:  Recent Labs Lab 07/29/14 2209 07/30/14 0540  AST 42* 36  ALT 33 29  ALKPHOS 105 80  BILITOT 5.2* 5.4*  PROT 5.6* 4.7*  ALBUMIN 2.4* 2.2*   No results for input(s): LIPASE, AMYLASE in the last 168 hours.  Recent Labs Lab 07/29/14 2209  AMMONIA 13   CBC:  Recent Labs Lab 07/29/14 2209 07/30/14 0540  WBC 12.9* 9.7  NEUTROABS 10.8*  --   HGB 8.9* 7.8*  HCT 25.8* 22.5*  MCV 91.8 91.5  PLT 64* 46*   Cardiac Enzymes: No results for input(s): CKTOTAL, CKMB, CKMBINDEX, TROPONINI in the last 168 hours. BNP (last 3 results) No results for input(s): BNP in the last 8760 hours.  ProBNP (last 3 results)  Recent Labs  05/23/14 0451  PROBNP 112.8    CBG: No results for input(s): GLUCAP in the last 168 hours.  No results found for this or any previous visit (from the past 240 hour(s)).   Studies: No results found.  Scheduled Meds: . antiseptic oral rinse  7 mL Mouth Rinse q12n4p  . chlorhexidine  15 mL Mouth Rinse BID  . feeding supplement (RESOURCE BREEZE)  1 Container Oral TID BM  . lactulose  20 g Oral Daily  . mirtazapine  7.5 mg Oral QHS  . sodium chloride  3 mL Intravenous Q12H   Continuous Infusions:   Principal Problem:   Hyponatremia Active Problems:   Thrombocytopenia   Ascites   Coagulopathy   Severe protein-calorie malnutrition   Hepatocellular carcinoma   Liver cirrhosis    Time spent: 20  min    [facility] Pager [phone]. If 7PM-7AM, please contact night-coverage at [email or web address], password [facility] 07/30/2014, 7:21 PM  LOS: 1 day

## 2014-07-30 NOTE — Progress Notes (Signed)
CRITICAL VALUE ALERT  Critical value received:  Sodium 116  Date of notification: 07/30/14  Time of notification:  1300  Critical value read back:Yes.    Nurse who received alert:  Eduard Clos  MD notified (1st page): Dr. Karleen Hampshire  Time of first page:  40  MD notified (2nd page):  Time of second page:  Responding MD:    Time MD responded:

## 2014-07-30 NOTE — ED Provider Notes (Signed)
Date: 07/30/2014  Rate: 99  Rhythm: normal sinus rhythm  QRS Axis: normal  Intervals: normal  ST/T Wave abnormalities: normal  Conduction Disutrbances:none  Narrative Interpretation: baseline wander, borderline low voltage limb leads  Old EKG Reviewed: none available  Rolland Porter, MD, Abram Sander   Janice Norrie, MD 07/30/14 [phone]

## 2014-07-30 NOTE — ED Notes (Signed)
Communication with RN that Urine delayed. RN verified to send pt to floor.

## 2014-07-31 LAB — BASIC METABOLIC PANEL
Anion gap: 4 — ABNORMAL LOW (ref 5–15)
BUN: 9 mg/dL (ref 6–23)
CHLORIDE: 90 mmol/L — AB (ref 96–112)
CO2: 20 mmol/L (ref 19–32)
Calcium: 7.3 mg/dL — ABNORMAL LOW (ref 8.4–10.5)
Creatinine, Ser: 0.53 mg/dL (ref 0.50–1.35)
GFR calc Af Amer: >90 mL/min (ref >90)
GFR calc non Af Amer: >90 mL/min (ref >90)
GLUCOSE: 99 mg/dL (ref 70–99)
Potassium: 4.4 mmol/L (ref 3.5–5.1)
SODIUM: 114 mmol/L — AB (ref 135–145)

## 2014-07-31 LAB — CBC
HCT: 23.9 % — ABNORMAL LOW (ref 39.0–52.0)
HEMOGLOBIN: 8.2 g/dL — AB (ref 13.0–17.0)
MCH: 31.4 pg (ref 26.0–34.0)
MCHC: 34.3 g/dL (ref 30.0–36.0)
MCV: 91.6 fL (ref 78.0–100.0)
Platelets: 48 10*3/uL — ABNORMAL LOW (ref 150–400)
RBC: 2.61 MIL/uL — ABNORMAL LOW (ref 4.22–5.81)
RDW: 17.2 % — ABNORMAL HIGH (ref 11.5–15.5)
WBC: 7.9 10*3/uL (ref 4.0–10.5)

## 2014-07-31 MED ORDER — FUROSEMIDE 10 MG/ML IJ SOLN
20.0000 mg | Freq: Four times a day (QID) | INTRAMUSCULAR | Status: DC
  Administered 2014-07-31 – 2014-08-01 (×5): 20 mg via INTRAVENOUS
  Filled 2014-07-31 (×7): qty 2

## 2014-07-31 NOTE — Progress Notes (Signed)
CRITICAL VALUE ALERT  Critical value received:  Na 114  Date of notification:  07/31/2014  Time of notification: 0645  Critical value read back: yes  Nurse who received alert:  Kathyrn Lass  MD notified (1st page):  Dr Hilbert Bible  Time of first page:  [phone]  MD notified (2nd page):  Time of second page:  Responding MD:    Time MD responded:

## 2014-07-31 NOTE — Evaluation (Signed)
Occupational Therapy Evaluation Patient Details Name: Bradley Woods MRN: [id number] DOB: 05-24-1956 Today's Date: 07/31/2014    History of Present Illness 59 y.o. male admitted with hyonatremia and dehydration and with a past medical history of liver cirrhosis, hepatocellular carcinoma, hepatitis C, previous history of alcohol abuse who is currently on the waiting list for liver transplant at [facility].   Clinical Impression   This 59 year old man was admitted for the above. He will benefit from skilled OT to increase strength for adls, further assess need for DME and educate on energy conservation.  Pt stated his wife has helped with showering mostly, but will assist as needed. Pt states he has felt weak for about a year.    Follow Up Recommendations  Supervision/Assistance - 24 hour    Equipment Recommendations   (to be further assessed for toilet DME)    Recommendations for Other Services       Precautions / Restrictions Precautions Precautions: Fall Restrictions Weight Bearing Restrictions: No      Mobility Bed Mobility Overal bed mobility: Modified Independent             General bed mobility comments: with HOB elevated  Transfers Overall transfer level: Modified independent Equipment used: None                  Balance     Sitting balance-Leahy Scale: Good       Standing balance-Leahy Scale: Fair                              ADL Overall ADL's : Needs assistance/impaired             Lower Body Bathing: Minimal assistance;Sit to/from stand       Lower Body Dressing: Moderate assistance;Sit to/from stand                 General ADL Comments: pt feels generally weak and fatiqued. He can perform UB adls with set up.   Sat EOB:  he can usually perform LB ADLs but had difficulty reaching to feet.  Did not stand with OT:  walked with PT earlier.  Was mod I to stand and min guard to supervision to  walk.  Endurance limited pt this session.  Educated on energy conservation and encouraged him to sit up for at least a short period today.       Vision                     Perception     Praxis      Pertinent Vitals/Pain Pain Assessment: No/denies pain     Hand Dominance     Extremity/Trunk Assessment Upper Extremity Assessment Upper Extremity Assessment: Generalized weakness (grossly 3 to 3+/5)      Cervical / Trunk Assessment Cervical / Trunk Assessment: Normal   Communication Communication Communication: No difficulties   Cognition Arousal/Alertness: Awake/alert Behavior During Therapy: WFL for tasks assessed/performed Overall Cognitive Status: Within Functional Limits for tasks assessed                     General Comments       Exercises       Shoulder Instructions      Home Living Family/patient expects to be discharged to:: Private residence Living Arrangements: Spouse/significant other Available Help at Discharge: Family;Available 24 hours/day Type of Home: House Home Access: Stairs to  enter Entrance Stairs-Number of Steps: 4 Entrance Stairs-Rails: None Home Layout: Two level;Able to live on main level with bedroom/bathroom Alternate Level Stairs-Number of Steps: 8 Alternate Level Stairs-Rails: Right Bathroom Shower/Tub: Occupational psychologist: Standard     Home Equipment: Shower seat - built in   Additional Comments: lives on 1st floor but does stairs for exercise      Prior Functioning/Environment Level of Independence: Needs assistance        Comments: wife assisted pt in shower    OT Diagnosis: Generalized weakness   OT Problem List: Decreased strength;Decreased knowledge of use of DME or AE;Decreased activity tolerance   OT Treatment/Interventions: Self-care/ADL training;Energy conservation;Patient/family education;Balance training    OT Goals(Current goals can be found in the care plan section) Acute  Rehab OT Goals Patient Stated Goal: get stronger OT Goal Formulation: With patient Time For Goal Achievement: 08/14/14 Potential to Achieve Goals: Good ADL Goals Pt Will Transfer to Toilet: with supervision;ambulating;bedside commode;regular height toilet (vs) Additional ADL Goal #1: pt will verbalize 3 energy conservation techniques and initiate a rest break during session for this Additional ADL Goal #2: pt will be independent with Bil UE HEP:  AROM vs level 1 theraband  OT Frequency: Min 2X/week   Barriers to D/C:            Co-evaluation              End of Session    Activity Tolerance: Patient limited by fatigue Patient left: in bed;with call bell/phone within reach   Time: 1134-1143 OT Time Calculation (min): 9 min Charges:  OT General Charges $OT Visit: 1 Procedure OT Evaluation $Initial OT Evaluation Tier I: 1 Procedure G-Codes:    Jamie Belger 2014-08-26, 12:28 PM  Lesle Chris, OTR/L [phone] Aug 26, 2014

## 2014-07-31 NOTE — Progress Notes (Signed)
Doctor please call Elsworth Soho (wife) at [phone] for update please.

## 2014-07-31 NOTE — Progress Notes (Signed)
Assessment/Plan: 59 year old WM with cirrhosis advanced and baseline sodium in the 120's. He now presents with a lower sodium felt to be in the setting of volume depletion which did not improve with IVF.  1. Chronic Hyponatremia- due to liver disease & excess H2O consumption.  He has excess ADH production(inappropriate) and peripheral edema that technically rules out SIADH.  Plan; I will start IV furosemide to see if we can create hypotonic urine  Subjective: Interval History: He reports he drinks 8-9 cans of Ginger Ale daily plus 3 pitchers of water.  He reports he has been on furosemide until admission but I can't corroborate that fact.  Objective: Vital signs in last 24 hours: Temp:  [97.9 F (36.6 C)-98.1 F (36.7 C)] 98.1 F (36.7 C) (02/05 0640) Pulse Rate:  [98-107] 98 (02/05 0640) Resp:  [20] 20 (02/05 0640) BP: (109-128)/(71-75) 109/71 mmHg (02/05 0640) SpO2:  [99 %-100 %] 99 % (02/05 0640) Weight:  [68.04 kg (150 lb)] 68.04 kg (150 lb) (02/05 0900) Weight change:   Intake/Output from previous day: 02/04 0701 - 02/05 0700 In: 130 [I.V.:130] Out: 106 [Urine:103; Stool:3] Intake/Output this shift: Total I/O In: 3 [I.V.:3] Out: -   General appearance: alert and cooperative Back: presacral edema present Resp: clear to auscultation bilaterally Cardio: regular rate and rhythm, S1, S2 normal, no murmur, click, rub or gallop Extremities: extremities normal, atraumatic, no cyanosis or edema  Tattoos and ear rings  Lab Results:  Recent Labs  07/30/14 0540 07/31/14 0600  WBC 9.7 7.9  HGB 7.8* 8.2*  HCT 22.5* 23.9*  PLT 46* 48*   BMET:  Recent Labs  07/30/14 1140 07/31/14 0555  NA 116* 114*  K 4.2 4.4  CL 89* 90*  CO2 20 20  GLUCOSE 135* 99  BUN 12 9  CREATININE 0.54 0.53  CALCIUM 7.3* 7.3*   No results for input(s): PTH in the last 72 hours. Iron Studies: No results for input(s): IRON, TIBC, TRANSFERRIN, FERRITIN in the last 72  hours. Studies/Results: No results found.  Scheduled: . antiseptic oral rinse  7 mL Mouth Rinse q12n4p  . chlorhexidine  15 mL Mouth Rinse BID  . feeding supplement (RESOURCE BREEZE)  1 Container Oral TID BM  . furosemide  20 mg Intravenous Q6H  . lactulose  20 g Oral Daily  . mirtazapine  7.5 mg Oral QHS  . sodium chloride  3 mL Intravenous Q12H     LOS: 2 days   Antjuan Rothe C 07/31/2014,2:50 PM

## 2014-07-31 NOTE — Progress Notes (Addendum)
PROGRESS NOTE    Bradley Woods [id number]:[id number] DOB: May 26, 1956 DOA: 07/29/2014 PCP: [name], MD  Primary GI: [name]. Patient also followed at [facility] liver transplant clinic.  HPI/Brief narrative 59 year old male with history of hepatic cirrhosis, hepatitis C, hepatocellular carcinoma, remote history of alcohol abuse (>20 years), on waiting list for liver transplant at [facility], chronic hyponatremia with serum sodiums in the 120s, HTN, anxiety & depression, anemia and thrombocytopenia presented with generalized weakness and some dizziness, for a few days PTA but denied nausea, vomiting, diarrhea, fever or chills. His oral intake of fluids is good but not eating much. He had gone to the liver transplant clinic on 2/3 and underwent blood work. On returning home to [facility], he was called that his sodium level was 114 and advised to come to the ED for further evaluation and management. Nephrology consulting.   Assessment/Plan:  1. Hyponatremia: Seems subacute on chronic. Baseline serum sodium level in the 120s. Initially felt to be secondary to dehydration and briefly hydrated with IV normal saline without change in serum sodium. Nephrology was consulted. He appeared slightly volume overloaded i.e. sacral pitting edema. IV fluids were discontinued and patient was placed on fluid restriction. Serum osmolarity: 242 and urine osmolarity 680 consistent with SIADH-like picture. Complicated by advanced liver disease and excess free water intake. Serum sodium since admission: 116 >115 >116 > 114. Discussed with nephrology and await their follow-up recommendations:? IV Lasix/demeclocycline/Tolvaptan. Do not believe that hypertonic saline is indicated at this time given chronicity of his hyponatremia. Fluid restriction. 2. Hyperkalemia: Resolved after a dose of Kayexalate. 3. Chronic normocytic anemia and thrombocytopenia: Stable. No reported bleeding. 4. Hepatic cirrhosis  with portal hypertension/hepatitis C/hepatocellular carcinoma/coagulopathy: Follows up with [facility] liver transplant center. Status post paracentesis 07/23/14 of 1.2 L.    Code Status: Full Family Communication: None at bedside. Disposition Plan: Home when medically stable.   Consultants:  Nephrology  Procedures:  None  Antibiotics:  None   Subjective: Feels tired.  Objective: Filed Vitals:   07/30/14 1305 07/30/14 2143 07/31/14 0640 07/31/14 0900  BP: 108/54 128/75 109/71   Pulse: 101 107 98   Temp: 98.5 F (36.9 C) 97.9 F (36.6 C) 98.1 F (36.7 C)   TempSrc: Oral Oral Oral   Resp: 20 20 20    Height:    5\' 9"  (1.753 m)  Weight:    68.04 kg (150 lb)  SpO2: 100% 100% 99%     Intake/Output Summary (Last 24 hours) at 07/31/14 1434 Last data filed at 07/31/14 0945  Gross per 24 hour  Intake    133 ml  Output      5 ml  Net    128 ml   Filed Weights   07/31/14 0900  Weight: 68.04 kg (150 lb)     Exam:  General exam: Pleasant middle-aged male lying comfortably supine in bed. Respiratory system: Clear. No increased work of breathing. Cardiovascular system: S1 & S2 heard, RRR. No JVD, murmurs, gallops, clicks or pedal edema. Telemetry: Sinus rhythm in the 90s-occasional sinus tachycardia in the 100s. Gastrointestinal system: Abdomen is nondistended, soft and nontender. Normal bowel sounds heard. No sacral edema. Central nervous system: Alert and oriented. No focal neurological deficits. Extremities: Symmetric 5 x 5 power.   Data Reviewed: Basic Metabolic Panel:  Recent Labs Lab 07/29/14 2209 07/30/14 0540 07/30/14 1140 07/31/14 0555  NA 116* 115* 116* 114*  K 5.6* 4.9 4.2 4.4  CL 89* 89* 89* 90*  CO2 21 20 20 20   GLUCOSE 102* 94 135* 99  BUN 13 12 12 9   CREATININE 0.65 0.59 0.54 0.53  CALCIUM 7.9* 7.3* 7.3* 7.3*   Liver Function Tests:  Recent Labs Lab 07/29/14 2209 07/30/14 0540  AST 42* 36  ALT 33 29  ALKPHOS 105 80  BILITOT  5.2* 5.4*  PROT 5.6* 4.7*  ALBUMIN 2.4* 2.2*   No results for input(s): LIPASE, AMYLASE in the last 168 hours.  Recent Labs Lab 07/29/14 2209  AMMONIA 13   CBC:  Recent Labs Lab 07/29/14 2209 07/30/14 0540 07/31/14 0600  WBC 12.9* 9.7 7.9  NEUTROABS 10.8*  --   --   HGB 8.9* 7.8* 8.2*  HCT 25.8* 22.5* 23.9*  MCV 91.8 91.5 91.6  PLT 64* 46* 48*   Cardiac Enzymes: No results for input(s): CKTOTAL, CKMB, CKMBINDEX, TROPONINI in the last 168 hours. BNP (last 3 results)  Recent Labs  05/23/14 0451  PROBNP 112.8   CBG:  Recent Labs Lab 07/30/14 2205  GLUCAP 121*    No results found for this or any previous visit (from the past 240 hour(s)).         Studies: No results found.      Scheduled Meds: . antiseptic oral rinse  7 mL Mouth Rinse q12n4p  . chlorhexidine  15 mL Mouth Rinse BID  . feeding supplement (RESOURCE BREEZE)  1 Container Oral TID BM  . lactulose  20 g Oral Daily  . mirtazapine  7.5 mg Oral QHS  . sodium chloride  3 mL Intravenous Q12H   Continuous Infusions:   Principal Problem:   Hyponatremia Active Problems:   Thrombocytopenia   Ascites   Coagulopathy   Severe protein-calorie malnutrition   Hepatocellular carcinoma   Liver cirrhosis    Time spent: 30 minutes.    Bradley Leep, MD, FACP, FHM. [facility] Pager [phone]  If 7PM-7AM, please contact night-coverage [email or web address] Password [email or web address] 07/31/2014, 2:34 PM    LOS: 2 days

## 2014-07-31 NOTE — Evaluation (Signed)
Physical Therapy Evaluation Patient Details Name: Bradley Woods MRN: [id number] DOB: 12-21-1955 Today's Date: 07/31/2014   History of Present Illness  59 y.o. male admitted with hyonatremia and dehydration and with a past medical history of liver cirrhosis, hepatocellular carcinoma, hepatitis C, previous history of alcohol abuse who is currently on the waiting list for liver transplant at [facility].  Clinical Impression  Pt admitted with above diagnosis. Pt currently with functional limitations due to the deficits listed below (see PT Problem List).  Pt will benefit from skilled PT to increase their independence and safety with mobility to allow discharge to the venue listed below.  Pt is moving at min/guard to S level with gait.  He has generalized weakness with decreased activity tolerance and is hoping to have HHPT.   Follow Up Recommendations Home health PT    Equipment Recommendations  None recommended by PT    Recommendations for Other Services       Precautions / Restrictions Precautions Precautions: Fall      Mobility  Bed Mobility Overal bed mobility: Modified Independent             General bed mobility comments: with HOB elevated  Transfers Overall transfer level: Modified independent Equipment used: None                Ambulation/Gait Ambulation/Gait assistance: Min guard;Supervision Ambulation Distance (Feet): 120 Feet   Gait Pattern/deviations: Step-through pattern;Drifts right/left     General Gait Details: Pt ambulating without AD with min/guard for longer distances. Amb in room with S.  Generalized weakness and muscle fatigue with gait.  Stairs            Wheelchair Mobility    Modified Rankin (Stroke Patients Only)       Balance     Sitting balance-Leahy Scale: Good       Standing balance-Leahy Scale: Fair                               Pertinent Vitals/Pain Pain Assessment:  No/denies pain    Home Living Family/patient expects to be discharged to:: Private residence Living Arrangements: Spouse/significant other Available Help at Discharge: Family;Available 24 hours/day Type of Home: House Home Access: Stairs to enter Entrance Stairs-Rails: None Entrance Stairs-Number of Steps: 4 Home Layout: Two level;Able to live on main level with bedroom/bathroom Home Equipment: None Additional Comments: lives on 1st floor but does stairs for exercise    Prior Function Level of Independence: Independent               Hand Dominance        Extremity/Trunk Assessment   Upper Extremity Assessment: Defer to OT evaluation           Lower Extremity Assessment: Overall WFL for tasks assessed;Generalized weakness      Cervical / Trunk Assessment: Normal  Communication      Cognition Arousal/Alertness: Awake/alert Behavior During Therapy: WFL for tasks assessed/performed Overall Cognitive Status: Within Functional Limits for tasks assessed                      General Comments General comments (skin integrity, edema, etc.): Pt declined ambulation initially, but when encouraged he agreed after coming back was happy that he had.  Discussed HHPT with pt and felt like he needed it.    Exercises        Assessment/Plan    PT  Assessment Patient needs continued PT services  PT Diagnosis Generalized weakness   PT Problem List Decreased mobility;Decreased balance;Decreased activity tolerance  PT Treatment Interventions Gait training;Stair training;Functional mobility training;Therapeutic exercise;Balance training   PT Goals (Current goals can be found in the Care Plan section) Acute Rehab PT Goals Patient Stated Goal: Pt would like HHPT to help him get stronger. PT Goal Formulation: With patient Time For Goal Achievement: 08/14/14 Potential to Achieve Goals: Good    Frequency Min 3X/week   Barriers to discharge        Co-evaluation                End of Session Equipment Utilized During Treatment: Gait belt Activity Tolerance: Patient tolerated treatment well Patient left: in bed Nurse Communication: Mobility status         Time: 0947-1000 PT Time Calculation (min) (ACUTE ONLY): 13 min   Charges:   PT Evaluation $Initial PT Evaluation Tier I: 1 Procedure     PT G Codes:        Oluchi Pucci LUBECK 07/31/2014, 10:37 AM

## 2014-07-31 NOTE — Progress Notes (Signed)
CARE MANAGEMENT NOTE 07/31/2014  Patient:  Bradley Woods, Bradley Woods   Account Number:  [id number]  Date Initiated:  07/31/2014  Documentation initiated by:  Edwyna Shell  Subjective/Objective Assessment:   59 yo male admitted with hyponatremia from home     Action/Plan:   discharge planning   Anticipated DC Date:  08/02/2014   Anticipated DC Plan:  [place]  CM consult      Choice offered to / List presented to:  C-1 Patient           [place]   Status of service:   Medicare Important Message given?   (If response is "NO", the following Medicare IM given date fields will be blank) Date Medicare IM given:   Medicare IM given by:   Date Additional Medicare IM given:   Additional Medicare IM given by:    Discharge Disposition:  [place]  Per UR Regulation:    If discussed at Long Length of Stay Meetings, dates discussed:    Comments:  07/31/14 Edwyna Shell RN [facility] CM [phone] Spoke with patient regarding PT recommendation for [facility] pt and patient is agreeable if Md feels appropriate. Provided choice list to patient and he stated that he has had [facility] services with [facility] in the past and he would like to use their services again. He stated that he will be staying with his wife at [address], [place] [address], wife's name is [place] and cell # [phone]. Patient stated he does not have or require any DME at this time. Referral provided to Sheppard Evens, [facility] rep, and above address and contact number was provided. Although awaiting orders.

## 2014-08-01 LAB — BASIC METABOLIC PANEL
Anion gap: 5 (ref 5–15)
BUN: 10 mg/dL (ref 6–23)
CALCIUM: 7.2 mg/dL — AB (ref 8.4–10.5)
CHLORIDE: 90 mmol/L — AB (ref 96–112)
CO2: 23 mmol/L (ref 19–32)
Creatinine, Ser: 0.63 mg/dL (ref 0.50–1.35)
Glucose, Bld: 90 mg/dL (ref 70–99)
POTASSIUM: 3.6 mmol/L (ref 3.5–5.1)
SODIUM: 118 mmol/L — AB (ref 135–145)

## 2014-08-01 LAB — CBC
HCT: 22.7 % — ABNORMAL LOW (ref 39.0–52.0)
HEMOGLOBIN: 7.8 g/dL — AB (ref 13.0–17.0)
MCH: 31.6 pg (ref 26.0–34.0)
MCHC: 34.4 g/dL (ref 30.0–36.0)
MCV: 91.9 fL (ref 78.0–100.0)
Platelets: 41 10*3/uL — ABNORMAL LOW (ref 150–400)
RBC: 2.47 MIL/uL — ABNORMAL LOW (ref 4.22–5.81)
RDW: 17.1 % — ABNORMAL HIGH (ref 11.5–15.5)
WBC: 6.8 10*3/uL (ref 4.0–10.5)

## 2014-08-01 LAB — PROTIME-INR
INR: 2.14 — ABNORMAL HIGH (ref 0.00–1.49)
Prothrombin Time: 24.1 seconds — ABNORMAL HIGH (ref 11.6–15.2)

## 2014-08-01 MED ORDER — FUROSEMIDE 10 MG/ML IJ SOLN
20.0000 mg | Freq: Two times a day (BID) | INTRAMUSCULAR | Status: DC
  Administered 2014-08-02: 20 mg via INTRAVENOUS
  Filled 2014-08-01 (×4): qty 2

## 2014-08-01 MED ORDER — POTASSIUM CHLORIDE CRYS ER 20 MEQ PO TBCR
30.0000 meq | EXTENDED_RELEASE_TABLET | Freq: Two times a day (BID) | ORAL | Status: AC
  Administered 2014-08-01 (×2): 30 meq via ORAL
  Filled 2014-08-01 (×2): qty 1

## 2014-08-01 NOTE — Progress Notes (Addendum)
cCRITICAL VALUE ALERT  Critical value received:  Sodium of 118  Date of notification:  08/01/2014  Time of notification:  0800  Critical value read back: yes  Nurse who received alert:  Jeannie Fend RN  MD notified (1st page):  Dr. Algis Liming  Time of first page:  0805  MD notified (2nd page):  Time of second page: 0836 Dr Karleen Hampshire  Time of third page: 1015 Dr Coralyn Pear  Responding MD:  Dr Coralyn Pear  Time MD responded:  1020

## 2014-08-01 NOTE — Progress Notes (Signed)
OT Cancellation Note  Patient Details Name: Bradley Woods MRN: [id number] DOB: 10-Sep-1955   Cancelled Treatment:    Reason Eval/Treat Not Completed: Other (comment). Pt is fatiqued and doesn't feel up to OT at this time.  Will check back another day.  Juquan Reznick 08/01/2014, 3:09 PM  Lesle Chris, OTR/L [phone] 08/01/2014

## 2014-08-01 NOTE — Progress Notes (Signed)
PROGRESS NOTE    Bradley Woods VEH:[id number] DOB: [birth year] DOA: 07/29/2014 PCP: [name], MD  Primary GI: [name]. Patient also followed at [facility] liver transplant clinic.  HPI/Brief narrative 59 year old male with history of hepatic cirrhosis, hepatitis C, hepatocellular carcinoma, remote history of alcohol abuse (>20 years), on waiting list for liver transplant at [facility], chronic hyponatremia with serum sodiums in the 120s, HTN, anxiety & depression, anemia and thrombocytopenia presented with generalized weakness and some dizziness, for a few days PTA but denied nausea, vomiting, diarrhea, fever or chills. His oral intake of fluids is good but not eating much. He had gone to the liver transplant clinic on 2/3 and underwent blood work. On returning home to [facility], he was called that his sodium level was 114 and advised to come to the ED for further evaluation and management. Nephrology consulting.   Assessment/Plan:  1. Hyponatremia: Patient with history of advanced liver disease, initially presenting with a sodium of 116. He has a history of hyponatremia with baseline sodium likely in the 120s. Initially it was suspected that hyponatremia could be secondary to find depletion as he was administered normal saline. Sodium did not improve after this intervention. Urinalysis showed a urine osmolality of 680 with a urine sodium of 55. 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 Nephrology was consulted as he was started on Lasix 20 mg IV every 6 hours.        Sodium improving to 118 on 08/01/2014 from 114.  2. Hyperkalemia: Resolved after a dose of Kayexalate. A.m. labs showing potassium of 3.6, will continue to monitor. 3. Chronic normocytic anemia and thrombocytopenia: Stable. No reported bleeding. 4. Hepatic cirrhosis with portal hypertension/hepatitis C/hepatocellular carcinoma/coagulopathy: Follows up with [facility] liver transplant  center. Status post paracentesis 07/23/14 of 1.2 L.  5. Coagulopathy. Secondary to advanced liver disease. Labs showing a 9 are 2.14 with PT of 24.1   Code Status: Full Family Communication: None at bedside. Disposition Plan: Home when medically stable.   Consultants:  Nephrology  Procedures:  None  Antibiotics:  None   Subjective: Feels tired, although thinks that he may be feeling a little better today compared to yesterday.  Objective: Filed Vitals:   07/31/14 0900 07/31/14 1513 07/31/14 2239 08/01/14 0542  BP:  100/58 111/71 104/68  Pulse:  95 107 101  Temp:  98.5 F (36.9 C) 98.5 F (36.9 C) 98.3 F (36.8 C)  TempSrc:  Oral Oral Oral  Resp:  16 18 18   Height: 5\' 9"  (1.753 m)     Weight: 68.04 kg (150 lb)     SpO2:  100% 100% 100%    Intake/Output Summary (Last 24 hours) at 08/01/14 1014 Last data filed at 08/01/14 0719  Gross per 24 hour  Intake      0 ml  Output   2125 ml  Net  -2125 ml   Filed Weights   07/31/14 0900  Weight: 68.04 kg (150 lb)     Exam:  General exam: Pleasant middle-aged male lying comfortably supine in bed. Respiratory system: Clear. No increased work of breathing. Cardiovascular system: S1 & S2 heard, RRR. No JVD, murmurs, gallops, clicks or pedal edema. Telemetry: Sinus rhythm in the 90s-occasional sinus tachycardia in the 100s. Gastrointestinal system: Abdomen is nondistended, soft and nontender. Normal bowel sounds heard. No sacral edema. Central nervous system: Alert and oriented. No focal neurological deficits. Extremities: Symmetric 5 x 5 power.   Data Reviewed:  Basic Metabolic Panel:  Recent Labs Lab 07/29/14 2209 07/30/14 0540 07/30/14 1140 07/31/14 0555 08/01/14 0542  NA 116* 115* 116* 114* 118*  K 5.6* 4.9 4.2 4.4 3.6  CL 89* 89* 89* 90* 90*  CO2 21 20 20 20 23   GLUCOSE 102* 94 135* 99 90  BUN 13 12 12 9 10   CREATININE 0.65 0.59 0.54 0.53 0.63  CALCIUM 7.9* 7.3* 7.3* 7.3* 7.2*   Liver Function  Tests:  Recent Labs Lab 07/29/14 2209 07/30/14 0540  AST 42* 36  ALT 33 29  ALKPHOS 105 80  BILITOT 5.2* 5.4*  PROT 5.6* 4.7*  ALBUMIN 2.4* 2.2*   No results for input(s): LIPASE, AMYLASE in the last 168 hours.  Recent Labs Lab 07/29/14 2209  AMMONIA 13   CBC:  Recent Labs Lab 07/29/14 2209 07/30/14 0540 07/31/14 0600 08/01/14 0542  WBC 12.9* 9.7 7.9 6.8  NEUTROABS 10.8*  --   --   --   HGB 8.9* 7.8* 8.2* 7.8*  HCT 25.8* 22.5* 23.9* 22.7*  MCV 91.8 91.5 91.6 91.9  PLT 64* 46* 48* 41*   Cardiac Enzymes: No results for input(s): CKTOTAL, CKMB, CKMBINDEX, TROPONINI in the last 168 hours. BNP (last 3 results)  Recent Labs  05/23/14 0451  PROBNP 112.8   CBG:  Recent Labs Lab 07/30/14 2205  GLUCAP 121*    No results found for this or any previous visit (from the past 240 hour(s)).         Studies: No results found.      Scheduled Meds: . antiseptic oral rinse  7 mL Mouth Rinse q12n4p  . chlorhexidine  15 mL Mouth Rinse BID  . feeding supplement (RESOURCE BREEZE)  1 Container Oral TID BM  . furosemide  20 mg Intravenous Q6H  . lactulose  20 g Oral Daily  . mirtazapine  7.5 mg Oral QHS  . sodium chloride  3 mL Intravenous Q12H   Continuous Infusions:   Principal Problem:   Hyponatremia Active Problems:   Thrombocytopenia   Ascites   Coagulopathy   Severe protein-calorie malnutrition   Hepatocellular carcinoma   Liver cirrhosis    Time spent: 30 minutes.    Kelvin Cellar, MD [facility] Pager [phone]  If 7PM-7AM, please contact night-coverage [email or web address] Password [email or web address] 08/01/2014, 10:14 AM    LOS: 3 days

## 2014-08-01 NOTE — Progress Notes (Signed)
Assessment/Plan: 59 year old WM with cirrhosis advanced and baseline sodium in the 120's. He now presents with a lower sodium felt to be in the setting of volume depletion which did not improve with IVF.  1. Chronic Hyponatremia- due to liver disease & excess H2O consumption. He has excess ADH production(inappropriate) and peripheral edema that technically rules out SIADH.  Plan: Cont. Furosemide, reduce dose  Subjective: Interval History: He has cups on his tray table and an empty water pitcher  Objective: Vital signs in last 24 hours: Temp:  [97.8 F (36.6 C)-98.5 F (36.9 C)] 97.8 F (36.6 C) (02/06 1411) Pulse Rate:  [84-107] 84 (02/06 1411) Resp:  [18] 18 (02/06 1411) BP: (90-111)/(68-71) 90/71 mmHg (02/06 1411) SpO2:  [100 %] 100 % (02/06 1411) Weight change:   Intake/Output from previous day: 02/05 0701 - 02/06 0700 In: 3 [I.V.:3] Out: 1425 [Urine:1425] Intake/Output this shift: Total I/O In: 320 [P.O.:320] Out: 1600 [Urine:1600]  General appearance: alert and cooperative Back: symmetric, no curvature. ROM normal. No CVA tenderness., presacral edema resolved Extremities: extremities normal, atraumatic, no cyanosis or edema  Lab Results:  Recent Labs  07/31/14 0600 08/01/14 0542  WBC 7.9 6.8  HGB 8.2* 7.8*  HCT 23.9* 22.7*  PLT 48* 41*   BMET:  Recent Labs  07/31/14 0555 08/01/14 0542  NA 114* 118*  K 4.4 3.6  CL 90* 90*  CO2 20 23  GLUCOSE 99 90  BUN 9 10  CREATININE 0.53 0.63  CALCIUM 7.3* 7.2*   No results for input(s): PTH in the last 72 hours. Iron Studies: No results for input(s): IRON, TIBC, TRANSFERRIN, FERRITIN in the last 72 hours. Studies/Results: No results found.  Scheduled: . antiseptic oral rinse  7 mL Mouth Rinse q12n4p  . chlorhexidine  15 mL Mouth Rinse BID  . feeding supplement (RESOURCE BREEZE)  1 Container Oral TID BM  . furosemide  20 mg Intravenous Q6H  . lactulose  20 g Oral Daily  . mirtazapine  7.5 mg Oral QHS  .  potassium chloride  30 mEq Oral BID  . sodium chloride  3 mL Intravenous Q12H     LOS: 3 days   Jerone Cudmore C 08/01/2014,5:42 PM

## 2014-08-02 LAB — BASIC METABOLIC PANEL
ANION GAP: 5 (ref 5–15)
BUN: 10 mg/dL (ref 6–23)
CALCIUM: 7.4 mg/dL — AB (ref 8.4–10.5)
CHLORIDE: 91 mmol/L — AB (ref 96–112)
CO2: 24 mmol/L (ref 19–32)
Creatinine, Ser: 0.53 mg/dL (ref 0.50–1.35)
GFR calc Af Amer: >90 mL/min (ref >90)
GFR calc non Af Amer: >90 mL/min (ref >90)
Glucose, Bld: 94 mg/dL (ref 70–99)
Potassium: 4.2 mmol/L (ref 3.5–5.1)
SODIUM: 120 mmol/L — AB (ref 135–145)

## 2014-08-02 NOTE — Progress Notes (Signed)
PROGRESS NOTE    Bradley Woods KZL:[id number] DOB: 1956-01-08 DOA: 07/29/2014 PCP: [name], MD  Primary GI: [name]. Patient also followed at [facility] liver transplant clinic.  HPI/Brief narrative 59 year old male with history of hepatic cirrhosis, hepatitis C, hepatocellular carcinoma, remote history of alcohol abuse (>20 years), on waiting list for liver transplant at [facility], chronic hyponatremia with serum sodiums in the 120s, HTN, anxiety & depression, anemia and thrombocytopenia presented with generalized weakness and some dizziness, for a few days PTA but denied nausea, vomiting, diarrhea, fever or chills. His oral intake of fluids is good but not eating much. He had gone to the liver transplant clinic on 2/3 and underwent blood work. On returning home to [facility], he was called that his sodium level was 114 and advised to come to the ED for further evaluation and management. Nephrology consulting.   Assessment/Plan:  1. Hyponatremia: Patient with history of advanced liver disease, initially presenting with a sodium of 116. He has a history of hyponatremia with baseline sodium likely in the 120s. Initially it was suspected that hyponatremia could be secondary to find depletion as he was administered normal saline. Sodium did not improve after this intervention. Urinalysis showed a urine osmolality of 680 with a urine sodium of 55. 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 Nephrology was consulted as he was started on Lasix 20 mg IV every 6 hours.        Sodium improving to 120 on 08/02/2014 from 114 (07/31/2014).  2. Hyperkalemia: Resolved after a dose of Kayexalate. A.m. labs showing potassium of 3.6, will continue to monitor. 3. Chronic normocytic anemia and thrombocytopenia: Stable. No reported bleeding. 4. Hepatic cirrhosis with portal hypertension/hepatitis C/hepatocellular carcinoma/coagulopathy: Follows up with [facility] liver  transplant center. Status post paracentesis 07/23/14 of 1.2 L.  5. Coagulopathy. Secondary to advanced liver disease. Labs showing a 9 are 2.14 with PT of 24.1 6. Deconditioning. Will consult PT    Code Status: Full Family Communication: None at bedside. Disposition Plan: Home when medically stable.   Consultants:  Nephrology  Procedures:  None  Antibiotics:  None   Subjective: He reports feeling a little better today, he is more awake today, sitting up eating ice cream.   Objective: Filed Vitals:   08/01/14 0542 08/01/14 1411 08/01/14 2205 08/02/14 0612  BP: 104/68 90/71 84/51  94/47  Pulse: 101 84 96 95  Temp: 98.3 F (36.8 C) 97.8 F (36.6 C) 98.2 F (36.8 C) 98.3 F (36.8 C)  TempSrc: Oral Oral Oral Oral  Resp: 18 18 18 18   Height:      Weight:    63.186 kg (139 lb 4.8 oz)  SpO2: 100% 100% 100% 100%    Intake/Output Summary (Last 24 hours) at 08/02/14 1131 Last data filed at 08/02/14 0800  Gross per 24 hour  Intake    560 ml  Output   1600 ml  Net  -1040 ml   Filed Weights   07/31/14 0900 08/02/14 0612  Weight: 68.04 kg (150 lb) 63.186 kg (139 lb 4.8 oz)     Exam:  General exam: Pleasant middle-aged male lying comfortably supine in bed. Respiratory system: Clear. No increased work of breathing. Cardiovascular system: S1 & S2 heard, RRR. No JVD, murmurs, gallops, clicks or pedal edema. Telemetry: Sinus rhythm in the 90s-occasional sinus tachycardia in the 100s. Gastrointestinal system: Abdomen is nondistended, soft and nontender. Normal bowel sounds heard. No sacral edema. Central nervous system:  Alert and oriented. No focal neurological deficits. Extremities: Symmetric 5 x 5 power.   Data Reviewed: Basic Metabolic Panel:  Recent Labs Lab 07/30/14 0540 07/30/14 1140 07/31/14 0555 08/01/14 0542 08/02/14 0639  NA 115* 116* 114* 118* 120*  K 4.9 4.2 4.4 3.6 4.2  CL 89* 89* 90* 90* 91*  CO2 20 20 20 23 24   GLUCOSE 94 135* 99 90 94  BUN 12  12 9 10 10   CREATININE 0.59 0.54 0.53 0.63 0.53  CALCIUM 7.3* 7.3* 7.3* 7.2* 7.4*   Liver Function Tests:  Recent Labs Lab 07/29/14 2209 07/30/14 0540  AST 42* 36  ALT 33 29  ALKPHOS 105 80  BILITOT 5.2* 5.4*  PROT 5.6* 4.7*  ALBUMIN 2.4* 2.2*   No results for input(s): LIPASE, AMYLASE in the last 168 hours.  Recent Labs Lab 07/29/14 2209  AMMONIA 13   CBC:  Recent Labs Lab 07/29/14 2209 07/30/14 0540 07/31/14 0600 08/01/14 0542  WBC 12.9* 9.7 7.9 6.8  NEUTROABS 10.8*  --   --   --   HGB 8.9* 7.8* 8.2* 7.8*  HCT 25.8* 22.5* 23.9* 22.7*  MCV 91.8 91.5 91.6 91.9  PLT 64* 46* 48* 41*   Cardiac Enzymes: No results for input(s): CKTOTAL, CKMB, CKMBINDEX, TROPONINI in the last 168 hours. BNP (last 3 results)  Recent Labs  05/23/14 0451  PROBNP 112.8   CBG:  Recent Labs Lab 07/30/14 2205  GLUCAP 121*    No results found for this or any previous visit (from the past 240 hour(s)).         Studies: No results found.      Scheduled Meds: . antiseptic oral rinse  7 mL Mouth Rinse q12n4p  . chlorhexidine  15 mL Mouth Rinse BID  . feeding supplement (RESOURCE BREEZE)  1 Container Oral TID BM  . furosemide  20 mg Intravenous Q12H  . lactulose  20 g Oral Daily  . mirtazapine  7.5 mg Oral QHS  . sodium chloride  3 mL Intravenous Q12H   Continuous Infusions:   Principal Problem:   Hyponatremia Active Problems:   Thrombocytopenia   Ascites   Coagulopathy   Severe protein-calorie malnutrition   Hepatocellular carcinoma   Liver cirrhosis    Time spent: 30 minutes.    Kelvin Cellar, MD [facility] Pager [phone]  If 7PM-7AM, please contact night-coverage [email or web address] Password [email or web address] 08/02/2014, 11:31 AM    LOS: 4 days

## 2014-08-02 NOTE — Progress Notes (Signed)
Assessment/Plan: 59 year old WM with cirrhosis advanced and baseline sodium in the 120's. He now presents with a lower sodium 1 Chronic Hyponatremia- due to liver disease & excess H2O consumption. He has excess ADH production(inappropriate) and peripheral edema that technically rules out SIADH.  Plan: Cont. Furosemide, change to PO soon if Na+ cont to improve  Subjective: Interval History: No complaints   Objective: Vital signs in last 24 hours: Temp:  [97.8 F (36.6 C)-98.3 F (36.8 C)] 98.3 F (36.8 C) (02/07 0612) Pulse Rate:  [84-96] 95 (02/07 0612) Resp:  [18] 18 (02/07 0612) BP: (84-94)/(47-71) 94/47 mmHg (02/07 0612) SpO2:  [100 %] 100 % (02/07 0612) Weight:  [63.186 kg (139 lb 4.8 oz)] 63.186 kg (139 lb 4.8 oz) (02/07 0612) Weight change: -4.853 kg (-10 lb 11.2 oz)  Intake/Output from previous day: 02/06 0701 - 02/07 0700 In: 320 [P.O.:320] Out: 2550 [Urine:2550] Intake/Output this shift:    Head: Normocephalic, without obvious abnormality, atraumatic Eyes: bruise under left eye that pt was not aware of Extremities: extremities normal, atraumatic, no cyanosis or edema Neurologic: Grossly normal  Lab Results:  Recent Labs  07/31/14 0600 08/01/14 0542  WBC 7.9 6.8  HGB 8.2* 7.8*  HCT 23.9* 22.7*  PLT 48* 41*   BMET:  Recent Labs  08/01/14 0542 08/02/14 0639  NA 118* 120*  K 3.6 4.2  CL 90* 91*  CO2 23 24  GLUCOSE 90 94  BUN 10 10  CREATININE 0.63 0.53  CALCIUM 7.2* 7.4*   No results for input(s): PTH in the last 72 hours. Iron Studies: No results for input(s): IRON, TIBC, TRANSFERRIN, FERRITIN in the last 72 hours. Studies/Results: No results found.  Scheduled: . antiseptic oral rinse  7 mL Mouth Rinse q12n4p  . chlorhexidine  15 mL Mouth Rinse BID  . feeding supplement (RESOURCE BREEZE)  1 Container Oral TID BM  . furosemide  20 mg Intravenous Q12H  . lactulose  20 g Oral Daily  . mirtazapine  7.5 mg Oral QHS  . sodium chloride  3 mL  Intravenous Q12H     LOS: 4 days   Malaijah Houchen C 08/02/2014,8:50 AM

## 2014-08-03 LAB — BASIC METABOLIC PANEL
ANION GAP: 7 (ref 5–15)
BUN: 11 mg/dL (ref 6–23)
CO2: 25 mmol/L (ref 19–32)
Calcium: 7.4 mg/dL — ABNORMAL LOW (ref 8.4–10.5)
Chloride: 88 mmol/L — ABNORMAL LOW (ref 96–112)
Creatinine, Ser: 0.52 mg/dL (ref 0.50–1.35)
GFR calc Af Amer: >90 mL/min (ref >90)
GFR calc non Af Amer: >90 mL/min (ref >90)
GLUCOSE: 85 mg/dL (ref 70–99)
POTASSIUM: 4.4 mmol/L (ref 3.5–5.1)
SODIUM: 120 mmol/L — AB (ref 135–145)

## 2014-08-03 NOTE — Progress Notes (Signed)
Physical Therapy Treatment Patient Details Name: Bradley Woods MRN: [id number] DOB: 07-10-1955 Today's Date: Aug 15, 2014    History of Present Illness 59 y.o. male admitted with hyonatremia and dehydration and with a past medical history of liver cirrhosis, hepatocellular carcinoma, hepatitis C, previous history of alcohol abuse who is currently on the waiting list for liver transplant at [facility].    PT Comments    Pt progressing; incr gait distance today;    Follow Up Recommendations  Home health PT     Equipment Recommendations  None recommended by PT    Recommendations for Other Services       Precautions / Restrictions Precautions Precautions: Fall Restrictions Weight Bearing Restrictions: No    Mobility  Bed Mobility Overal bed mobility: Modified Independent             General bed mobility comments: with HOB elevated  Transfers Overall transfer level: Modified independent Equipment used: None                Ambulation/Gait Ambulation/Gait assistance: Supervision;Min guard Ambulation Distance (Feet): 240 Feet Assistive device: None Gait Pattern/deviations: Step-through pattern;Drifts right/left     General Gait Details: decr L knee control in stance-hyperextends but no buckling; min/guard to supervision for safety   Stairs            Wheelchair Mobility    Modified Rankin (Stroke Patients Only)       Balance     Sitting balance-Leahy Scale: Good       Standing balance-Leahy Scale: Fair                      Cognition Arousal/Alertness: Awake/alert Behavior During Therapy: WFL for tasks assessed/performed Overall Cognitive Status: Within Functional Limits for tasks assessed                      Exercises      General Comments        Pertinent Vitals/Pain Pain Assessment: No/denies pain    Home Living                      Prior Function            PT Goals  (current goals can now be found in the care plan section) Acute Rehab PT Goals Patient Stated Goal: get stronger PT Goal Formulation: With patient Time For Goal Achievement: 08/14/14 Potential to Achieve Goals: Good Progress towards PT goals: Progressing toward goals    Frequency  Min 3X/week    PT Plan Current plan remains appropriate    Co-evaluation             End of Session Equipment Utilized During Treatment: Gait belt Activity Tolerance: Patient tolerated treatment well Patient left: in bed;with call bell/phone within reach;with bed alarm set     Time: 1130-1141 PT Time Calculation (min) (ACUTE ONLY): 11 min  Charges:  $Gait Training: 8-22 mins                    G Codes:      Rosamaria Donn 2014-08-15, 1:00 PM

## 2014-08-03 NOTE — Progress Notes (Signed)
PROGRESS NOTE    Bradley Woods JAS:[id number] DOB: 31-Jan-1956 DOA: 07/29/2014 PCP: [name], MD  Primary GI: [name]. Patient also followed at [facility] liver transplant clinic.  HPI/Brief narrative 59 year old male with history of hepatic cirrhosis, hepatitis C, hepatocellular carcinoma, remote history of alcohol abuse (>20 years), on waiting list for liver transplant at [facility], chronic hyponatremia with serum sodiums in the 120s, HTN, anxiety & depression, anemia and thrombocytopenia presented with generalized weakness and some dizziness, for a few days PTA but denied nausea, vomiting, diarrhea, fever or chills. His oral intake of fluids is good but not eating much. He had gone to the liver transplant clinic on 2/3 and underwent blood work. On returning home to [facility], he was called that his sodium level was 114 and advised to come to the ED for further evaluation and management. Nephrology consulting.  Subjective: Sleepy but otherwise no complaints.   Assessment/Plan:  1. Hyponatremia: Patient with history of advanced liver disease, initially presenting with a sodium of 116. He has a history of hyponatremia with baseline sodium likely in the 120s. Initially it was suspected that hyponatremia could be secondary to volume depletion and he was administered normal saline. Sodium did not improve after this intervention. Urinalysis showed a urine osmolality of 680 with a urine sodium of 55. 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 Nephrology was consulted as he was started on Lasix 20 mg IV every 6 hours.        Sodium improving to 120- cont fluid restriction 2. Hyperkalemia: Resolved after a dose of Kayexalate. 3. Chronic normocytic anemia and thrombocytopenia: Stable. No reported bleeding. 4. Hepatic cirrhosis with portal hypertension/hepatitis C/hepatocellular carcinoma/coagulopathy: Follows up with [facility] liver transplant center.  Status post paracentesis 07/23/14 of 1.2 L.  5. Coagulopathy. Secondary to advanced liver disease. Labs showing a 9 are 2.14 with PT of 24.1 6. Deconditioning. PT eval pending- have asked RN to ambulate patient.     Code Status: Full Family Communication: None at bedside. Disposition Plan: Home when medically stable.   Consultants:  Nephrology  Procedures:  None  Antibiotics:  None    Objective: Filed Vitals:   08/02/14 1423 08/02/14 1500 08/02/14 2215 08/03/14 0528  BP: 89/47 110/52 91/59 93/56   Pulse: 77 100 100 91  Temp: 98 F (36.7 C) 98.6 F (37 C) 98.1 F (36.7 C) 98 F (36.7 C)  TempSrc: Oral Oral Oral Oral  Resp: 18 18 18 18   Height:      Weight:      SpO2: 100% 94% 100% 99%    Intake/Output Summary (Last 24 hours) at 08/03/14 1140 Last data filed at 08/02/14 1700  Gross per 24 hour  Intake    400 ml  Output    251 ml  Net    149 ml   Filed Weights   07/31/14 0900 08/02/14 0612  Weight: 68.04 kg (150 lb) 63.186 kg (139 lb 4.8 oz)     Exam:  General exam: Pleasant middle-aged male lying comfortably supine in bed. AAO x3 Respiratory system: Clear. No increased work of breathing. Cardiovascular system: S1 & S2 heard, RRR. No JVD, murmurs, gallops, clicks or pedal edema. Telemetry: Sinus rhythm in the 90s-occasional sinus tachycardia in the 100s. Gastrointestinal system: Abdomen is nondistended, soft and nontender. Normal bowel sounds heard. No sacral edema. Central nervous system: Alert and oriented. No focal neurological deficits. Extremities: Symmetric 5 x 5 power.   Data Reviewed: Basic  Metabolic Panel:  Recent Labs Lab 07/30/14 1140 07/31/14 0555 08/01/14 0542 08/02/14 0639 08/03/14 0530  NA 116* 114* 118* 120* 120*  K 4.2 4.4 3.6 4.2 4.4  CL 89* 90* 90* 91* 88*  CO2 20 20 23 24 25   GLUCOSE 135* 99 90 94 85  BUN 12 9 10 10 11   CREATININE 0.54 0.53 0.63 0.53 0.52  CALCIUM 7.3* 7.3* 7.2* 7.4* 7.4*   Liver Function  Tests:  Recent Labs Lab 07/29/14 2209 07/30/14 0540  AST 42* 36  ALT 33 29  ALKPHOS 105 80  BILITOT 5.2* 5.4*  PROT 5.6* 4.7*  ALBUMIN 2.4* 2.2*   No results for input(s): LIPASE, AMYLASE in the last 168 hours.  Recent Labs Lab 07/29/14 2209  AMMONIA 13   CBC:  Recent Labs Lab 07/29/14 2209 07/30/14 0540 07/31/14 0600 08/01/14 0542  WBC 12.9* 9.7 7.9 6.8  NEUTROABS 10.8*  --   --   --   HGB 8.9* 7.8* 8.2* 7.8*  HCT 25.8* 22.5* 23.9* 22.7*  MCV 91.8 91.5 91.6 91.9  PLT 64* 46* 48* 41*   Cardiac Enzymes: No results for input(s): CKTOTAL, CKMB, CKMBINDEX, TROPONINI in the last 168 hours. BNP (last 3 results)  Recent Labs  05/23/14 0451  PROBNP 112.8   CBG:  Recent Labs Lab 07/30/14 2205  GLUCAP 121*    No results found for this or any previous visit (from the past 240 hour(s)).         Studies: No results found.      Scheduled Meds: . antiseptic oral rinse  7 mL Mouth Rinse q12n4p  . chlorhexidine  15 mL Mouth Rinse BID  . feeding supplement (RESOURCE BREEZE)  1 Container Oral TID BM  . furosemide  20 mg Intravenous Q12H  . lactulose  20 g Oral Daily  . mirtazapine  7.5 mg Oral QHS  . sodium chloride  3 mL Intravenous Q12H   Continuous Infusions:      Time spent: 30 minutes.    Debbe Odea, MD [facility] Pager : [email or web address] Password [email or web address] 08/03/2014, 11:40 AM    LOS: 5 days

## 2014-08-03 NOTE — Progress Notes (Signed)
Assessment/Plan: 59 year old WM with cirrhosis advanced and baseline sodium in the 120's. He now presents with a lower sodium 1 Chronic Hyponatremia- due to liver disease & excess H2O consumption. He has excess ADH production(inappropriate) and peripheral edema that technically rules out SIADH.  Plan: cont lasix. Repeating urine studies today.  Na level may be leveling off around 120.    Subjective: Interval History: No complaints   Objective: Vital signs in last 24 hours: Temp:  [98 F (36.7 C)-98.4 F (36.9 C)] 98.4 F (36.9 C) (02/08 1431) Pulse Rate:  [91-100] 94 (02/08 1431) Resp:  [18-20] 20 (02/08 1431) BP: (91-95)/(52-59) 95/52 mmHg (02/08 1431) SpO2:  [99 %-100 %] 100 % (02/08 1431) Weight change:   Intake/Output from previous day: 02/07 0701 - 02/08 0700 In: 67 [P.O.:520] Out: 551 [Urine:550; Stool:1] Intake/Output this shift: Total I/O In: 180 [P.O.:180] Out: 500 [Urine:500]  Head: Normocephalic, without obvious abnormality, atraumatic Eyes: bruise under left eye that pt was not aware of Extremities: extremities normal, atraumatic, no cyanosis or edema Neurologic: Grossly normal  Lab Results:  Recent Labs  08/01/14 0542  WBC 6.8  HGB 7.8*  HCT 22.7*  PLT 41*   BMET:   Recent Labs  08/02/14 0639 08/03/14 0530  NA 120* 120*  K 4.2 4.4  CL 91* 88*  CO2 24 25  GLUCOSE 94 85  BUN 10 11  CREATININE 0.53 0.52  CALCIUM 7.4* 7.4*   No results for input(s): PTH in the last 72 hours. Iron Studies: No results for input(s): IRON, TIBC, TRANSFERRIN, FERRITIN in the last 72 hours. Studies/Results: No results found.  Scheduled: . antiseptic oral rinse  7 mL Mouth Rinse q12n4p  . chlorhexidine  15 mL Mouth Rinse BID  . feeding supplement (RESOURCE BREEZE)  1 Container Oral TID BM  . furosemide  20 mg Intravenous Q12H  . lactulose  20 g Oral Daily  . mirtazapine  7.5 mg Oral QHS  . sodium chloride  3 mL Intravenous Q12H     LOS: 5 days    Bradley Woods D 08/03/2014,4:10 PM

## 2014-08-04 DIAGNOSIS — K7469 Other cirrhosis of liver: Secondary | ICD-10-CM

## 2014-08-04 LAB — BASIC METABOLIC PANEL
ANION GAP: 4 — AB (ref 5–15)
BUN: 10 mg/dL (ref 6–23)
CO2: 26 mmol/L (ref 19–32)
CREATININE: 0.5 mg/dL (ref 0.50–1.35)
Calcium: 7.6 mg/dL — ABNORMAL LOW (ref 8.4–10.5)
Chloride: 91 mmol/L — ABNORMAL LOW (ref 96–112)
GFR calc Af Amer: >90 mL/min (ref >90)
GFR calc non Af Amer: >90 mL/min (ref >90)
Glucose, Bld: 79 mg/dL (ref 70–99)
Potassium: 4.6 mmol/L (ref 3.5–5.1)
Sodium: 121 mmol/L — ABNORMAL LOW (ref 135–145)

## 2014-08-04 LAB — OSMOLALITY, URINE: Osmolality, Ur: 432 mOsm/kg (ref 390–1090)

## 2014-08-04 NOTE — Progress Notes (Signed)
Assessment/Plan: 59 year old WM with cirrhosis advanced and baseline sodium in the 120's. He now presents with a lower sodium  1 Chronic Hyponatremia - hypervolemic hyponatremia vs cirrhosis, probably some of both. Responded to a few days of diuretics and fluid restriction.  Na+ has been low since Nov 2015 around 118-120.  Mainstay of Rx should be fluid restriction in this patient.  Lasix could be used when there are signs of vol excess, i.e.edema.  Hyponatremia is a poor prognostic indicator in cirrhotics in general.  No further suggestions.  Will sign off.   Kelly Splinter MD (pgr) [phone]    ([phone] 08/04/2014, 5:38 PM   Subjective: Interval History: No complaints   Objective: Vital signs in last 24 hours: Temp:  [98 F (36.7 C)-98.4 F (36.9 C)] 98.4 F (36.9 C) (02/09 1312) Pulse Rate:  [91-98] 98 (02/09 1312) Resp:  [18-20] 18 (02/09 1312) BP: (92-101)/(55-60) 98/55 mmHg (02/09 1312) SpO2:  [99 %-100 %] 99 % (02/09 1312) Weight change:   Intake/Output from previous day: 02/08 0701 - 02/09 0700 In: 360 [P.O.:360] Out: 700 [Urine:700] Intake/Output this shift: Total I/O In: 180 [P.O.:180] Out: 200 [Urine:200]  Head: Normocephalic, without obvious abnormality, atraumatic Eyes: bruise under left eye that pt was not aware of Extremities: extremities normal, atraumatic, no cyanosis or edema Neurologic: Grossly normal  Lab Results: No results for input(s): WBC, HGB, HCT, PLT in the last 72 hours. BMET:   Recent Labs  08/03/14 0530 08/04/14 0518  NA 120* 121*  K 4.4 4.6  CL 88* 91*  CO2 25 26  GLUCOSE 85 79  BUN 11 10  CREATININE 0.52 0.50  CALCIUM 7.4* 7.6*   No results for input(s): PTH in the last 72 hours. Iron Studies: No results for input(s): IRON, TIBC, TRANSFERRIN, FERRITIN in the last 72 hours. Studies/Results: No results found.  Scheduled: . antiseptic oral rinse  7 mL Mouth Rinse q12n4p  . chlorhexidine  15 mL Mouth Rinse BID  . feeding  supplement (RESOURCE BREEZE)  1 Container Oral TID BM  . lactulose  20 g Oral Daily  . mirtazapine  7.5 mg Oral QHS  . sodium chloride  3 mL Intravenous Q12H

## 2014-08-04 NOTE — Progress Notes (Signed)
PROGRESS NOTE    Bradley Woods TTS:[id number] DOB: [birth year] DOA: 07/29/2014 PCP: [name], MD  Primary GI: [name]. Patient also followed at [facility] liver transplant clinic.  HPI/Brief narrative 59 year old male with history of hepatic cirrhosis, hepatitis C, hepatocellular carcinoma, remote history of alcohol abuse (>20 years), on waiting list for liver transplant at [facility], chronic hyponatremia with serum sodiums in the 120s, HTN, anxiety & depression, anemia and thrombocytopenia presented with generalized weakness and some dizziness, for a few days PTA but denied nausea, vomiting, diarrhea, fever or chills. His oral intake of fluids is good but not eating much. He had gone to the liver transplant clinic on 2/3 and underwent blood work. On returning home to [facility], he was called that his sodium level was 114 and advised to come to the ED for further evaluation and management. Nephrology consulting.  Subjective: No complaints today.   Assessment/Plan:  1. Hyponatremia: Patient with history of advanced liver disease, initially presenting with a sodium of 116. He has a history of hyponatremia with baseline sodium likely in the 120s. Initially it was suspected that hyponatremia could be secondary to volume depletion and he was administered normal saline. Sodium did not improve after this intervention. Urinalysis showed a urine osmolality of 680 with a urine sodium of 55. 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 Nephrology was consulted as he was started on Lasix 20 mg IV every 6 hours.        Sodium improving to 120- cont fluid restriction 2. Hyperkalemia: Resolved after a dose of Kayexalate. 3. Chronic normocytic anemia and thrombocytopenia: Stable. No reported bleeding. 4. Hepatic cirrhosis with portal hypertension and ascites/hepatitis C/hepatocellular carcinoma/coagulopathy: Follows up with [facility] liver transplant center.  Status post paracentesis 07/23/14 of 1.2 L.  5. Coagulopathy. Secondary to advanced liver disease. Labs showing a 9 are 2.14 with PT of 24.1 6. Deconditioning. PT eval pending- have asked RN to ambulate patient. HHPT recommended on d/c.     Code Status: Full Family Communication: None at bedside. Disposition Plan: Home when medically stable.   Consultants:  Nephrology  Procedures:  None  Antibiotics:  None    Objective: Filed Vitals:   08/03/14 0528 08/03/14 1431 08/03/14 2139 08/04/14 0542  BP: 93/56 95/52 101/60 92/58  Pulse: 91 94 94 91  Temp: 98 F (36.7 C) 98.4 F (36.9 C) 98 F (36.7 C) 98.1 F (36.7 C)  TempSrc: Oral Oral Oral Oral  Resp: 18 20 19 20   Height:      Weight:      SpO2: 99% 100% 100% 100%    Intake/Output Summary (Last 24 hours) at 08/04/14 1118 Last data filed at 08/04/14 0900  Gross per 24 hour  Intake    300 ml  Output    500 ml  Net   -200 ml   Filed Weights   07/31/14 0900 08/02/14 0612  Weight: 68.04 kg (150 lb) 63.186 kg (139 lb 4.8 oz)     Exam:  General exam: Pleasant middle-aged male lying comfortably supine in bed. AAO x3 Respiratory system: Clear. No increased work of breathing. Cardiovascular system: S1 & S2 heard, RRR. No JVD, murmurs, gallops, clicks or pedal edema. T Gastrointestinal system: Abdomen is distended, soft and nontender. Normal bowel sounds heard. No sacral edema. Central nervous system: Alert and oriented. No focal neurological deficits. Extremities: Symmetric 5 x 5 power.   Data Reviewed: Basic Metabolic Panel:  Recent Labs Lab 07/31/14  [id number] 08/01/14 0542 08/02/14 0639 08/03/14 0530 08/04/14 0518  NA 114* 118* 120* 120* 121*  K 4.4 3.6 4.2 4.4 4.6  CL 90* 90* 91* 88* 91*  CO2 20 23 24 25 26   GLUCOSE 99 90 94 85 79  BUN 9 10 10 11 10   CREATININE 0.53 0.63 0.53 0.52 0.50  CALCIUM 7.3* 7.2* 7.4* 7.4* 7.6*   Liver Function Tests:  Recent Labs Lab 07/29/14 2209 07/30/14 0540  AST 42* 36   ALT 33 29  ALKPHOS 105 80  BILITOT 5.2* 5.4*  PROT 5.6* 4.7*  ALBUMIN 2.4* 2.2*   No results for input(s): LIPASE, AMYLASE in the last 168 hours.  Recent Labs Lab 07/29/14 2209  AMMONIA 13   CBC:  Recent Labs Lab 07/29/14 2209 07/30/14 0540 07/31/14 0600 08/01/14 0542  WBC 12.9* 9.7 7.9 6.8  NEUTROABS 10.8*  --   --   --   HGB 8.9* 7.8* 8.2* 7.8*  HCT 25.8* 22.5* 23.9* 22.7*  MCV 91.8 91.5 91.6 91.9  PLT 64* 46* 48* 41*   Cardiac Enzymes: No results for input(s): CKTOTAL, CKMB, CKMBINDEX, TROPONINI in the last 168 hours. BNP (last 3 results)  Recent Labs  05/23/14 0451  PROBNP 112.8   CBG:  Recent Labs Lab 07/30/14 2205  GLUCAP 121*    No results found for this or any previous visit (from the past 240 hour(s)).         Studies: No results found.      Scheduled Meds: . antiseptic oral rinse  7 mL Mouth Rinse q12n4p  . chlorhexidine  15 mL Mouth Rinse BID  . feeding supplement (RESOURCE BREEZE)  1 Container Oral TID BM  . lactulose  20 g Oral Daily  . mirtazapine  7.5 mg Oral QHS  . sodium chloride  3 mL Intravenous Q12H   Continuous Infusions:      Time spent: 30 minutes.    Debbe Odea, MD [facility] Pager : [email or web address] Password [email or web address] 08/04/2014, 11:18 AM    LOS: 6 days

## 2014-08-04 NOTE — Progress Notes (Addendum)
Occupational Therapy Treatment Patient Details Name: Kahleb Mcclane MRN: [id number] DOB: [birth year] Today's Date: 08/04/2014    History of present illness 59 y.o. male admitted with hyonatremia and dehydration and with a past medical history of liver cirrhosis, hepatocellular carcinoma, hepatitis C, previous history of alcohol abuse who is currently on the waiting list for liver transplant at [facility].   OT comments  Limited session due to BP dropped with position changes from supine to sitting and then to standing. Nursing made aware. Pt shaky in standing and reported feeling light headed so returned to supine. Encouraged OOB later with nursing as able and explained benefit of activity as pt initially not agreeable to working with OT. Will need to assess toilet transfers as able to determine DME needs. Issued theraband for exercises but will need to further educate on how to perform. Demonstrated only today as lunch had just arrived.    Follow Up Recommendations  Supervision/Assistance - 24 hour;Home health OT    Equipment Recommendations   (to further assess as pt able)    Recommendations for Other Services      Precautions / Restrictions Precautions Precautions: Fall Precaution Comments: orthostatic; monitor vitals       Mobility Bed Mobility Overal bed mobility: Modified Independent                Transfers Overall transfer level: Needs assistance Equipment used: None Transfers: Sit to/from Stand Sit to Stand: Min guard              Balance                                   ADL                                         General ADL Comments: pt initially stating he didnt want to get up with OT but with encouragement did agree. BP taken supine and was 94/60. Sitting EOB 95/58. Pt reporting some dizziness. Standing 82/50 and pt still dizzy. Noted pt to be shaky so had pt sit back down. Noted bleeding from  L forearm under gauze so informed nursing and reported dizziness and BP dropping. BP after lying in bed for several minutes 100/58. Encouraged pt to get up with nursing to sit in the chair later today as able and benefits of OOB and sitting up. Pt verbalized understanding.       Vision                     Perception     Praxis      Cognition   Behavior During Therapy: Flat affect Overall Cognitive Status: Within Functional Limits for tasks assessed                       Extremity/Trunk Assessment               Exercises Other Exercises Other Exercises: Issued yellow theraband and demonstrated exercises including shoulder flexion and horizontal abduction. Pt with lunch and eating when therapy brought band back by so only demonstrated exercises this visit.    Shoulder Instructions       General Comments      Pertinent Vitals/ Pain       Pain Assessment: No/denies  pain  Home Living                                          Prior Functioning/Environment              Frequency Min 2X/week     Progress Toward Goals  OT Goals(current goals can now be found in the care plan section)  Progress towards OT goals: Not progressing toward goals - comment (BP dropped with mobility)     Plan Discharge plan remains appropriate    Co-evaluation                 End of Session     Activity Tolerance Patient limited by fatigue;Other (comment) (BP dropped)   Patient Left in bed;with call bell/phone within reach;with bed alarm set   Nurse Communication          Time: 1210-1230 OT Time Calculation (min): 20 min  Charges: OT General Charges $OT Visit: 1 Procedure OT Treatments $Therapeutic Activity: 8-22 mins  Jules Schick  [phone] 08/04/2014, 1:19 PM

## 2014-08-05 LAB — BASIC METABOLIC PANEL
ANION GAP: 4 — AB (ref 5–15)
Anion gap: 4 — ABNORMAL LOW (ref 5–15)
BUN: 10 mg/dL (ref 6–23)
BUN: 9 mg/dL (ref 6–23)
CALCIUM: 7.1 mg/dL — AB (ref 8.4–10.5)
CALCIUM: 7.5 mg/dL — AB (ref 8.4–10.5)
CO2: 23 mmol/L (ref 19–32)
CO2: 24 mmol/L (ref 19–32)
Chloride: 94 mmol/L — ABNORMAL LOW (ref 96–112)
Chloride: 95 mmol/L — ABNORMAL LOW (ref 96–112)
Creatinine, Ser: 0.47 mg/dL — ABNORMAL LOW (ref 0.50–1.35)
Creatinine, Ser: 0.42 mg/dL — ABNORMAL LOW (ref 0.50–1.35)
GFR calc Af Amer: >90 mL/min (ref >90)
GFR calc non Af Amer: >90 mL/min (ref >90)
Glucose, Bld: 136 mg/dL — ABNORMAL HIGH (ref 70–99)
Glucose, Bld: 132 mg/dL — ABNORMAL HIGH (ref 70–99)
Potassium: 3.9 mmol/L (ref 3.5–5.1)
Potassium: 4.1 mmol/L (ref 3.5–5.1)
SODIUM: 121 mmol/L — AB (ref 135–145)
Sodium: 123 mmol/L — ABNORMAL LOW (ref 135–145)

## 2014-08-05 LAB — CREATININE, URINE, RANDOM: CREATININE, URINE: 107.6 mg/dL

## 2014-08-05 LAB — SODIUM, URINE, RANDOM

## 2014-08-05 MED ORDER — BOOST / RESOURCE BREEZE PO LIQD: 1.0000 | Freq: Three times a day (TID) | ORAL | Status: AC

## 2014-08-05 NOTE — Discharge Summary (Signed)
Physician Discharge Summary  Dantae Meunier VZS:[id number] DOB: Sep 23, 1955 DOA: 07/29/2014  PCP: [name], MD  Admit date: 07/29/2014 Discharge date: 08/05/2014  Time spent: 45 minutes  Recommendations for Outpatient Follow-up:  1. Check Sodium in 1 wk  Discharge Condition: stable Diet recommendation: regular diet- fluid restrict to 1 L per day  Discharge Diagnoses:  Principal Problem:   Hyponatremia Active Problems:   Thrombocytopenia   Ascites   Coagulopathy   Severe protein-calorie malnutrition   Hepatocellular carcinoma   Liver cirrhosis   History of present illness:  59 year old male with history of hepatic cirrhosis, hepatitis C, hepatocellular carcinoma, remote history of alcohol abuse (>20 years), on waiting list for liver transplant at [facility], chronic hyponatremia with serum sodiums in the 120s, HTN, anxiety & depression, anemia and thrombocytopenia presented with generalized weakness and some dizziness, for a few days PTA but denied nausea, vomiting, diarrhea, fever or chills. His oral intake of fluids is good but not eating much. He had gone to the liver transplant clinic on 2/3 and underwent blood work. On returning home to [facility], he was called that his sodium level was 114 and advised to come to the ED for further evaluation and management. Nephrology consulting.  Hospital Course:  1. Hyponatremia: Patient with history of advanced liver disease, initially presenting with a sodium of 116. He has a history of hyponatremia with baseline sodium likely in the 120s. Initially it was suspected that hyponatremia could be secondary to volume depletion and he was administered normal saline. Sodium did not improve after this intervention. Urinalysis showed a urine osmolality of 680 with a urine sodium of 55.  Nephrology was consulted as he was started on Lasix 20 mg IV every 6 hours and fluid restricted. Sodium has no improved to 123- will need close f/u  as outpt-  2. Hyperkalemia: Resolved after a dose of Kayexalate. 3. Chronic normocytic anemia and thrombocytopenia: Stable. No reported bleeding. 4. Hepatic cirrhosis with portal hypertension and ascites/hepatitis C/hepatocellular carcinoma/coagulopathy: Follows up with [facility] liver transplant center. Status post paracentesis 07/23/14 of 1.2 L.  5. Coagulopathy. Secondary to advanced liver disease. Labs showing a 9 are 2.14 with PT of 24.1 6. Deconditioning. PT eval pending- have asked   Consultations:  Nephrology  GI  Discharge Exam: Filed Weights   07/31/14 0900 08/02/14 0612  Weight: 68.04 kg (150 lb) 63.186 kg (139 lb 4.8 oz)   Filed Vitals:   08/05/14 0557  BP: 103/66  Pulse: 92  Temp: 97.6 F (36.4 C)  Resp: 18    General: AAO x 3, no distress Cardiovascular: RRR, no murmurs  Respiratory: clear to auscultation bilaterally GI: soft, non-tender, abdomen is distended, bowel sound positive  Discharge Instructions You were cared for by a hospitalist during your hospital stay. If you have any questions about your discharge medications or the care you received while you were in the hospital after you are discharged, you can call the unit and asked to speak with the hospitalist on call if the hospitalist that took care of you is not available. Once you are discharged, your primary care physician will handle any further medical issues. Please note that NO REFILLS for any discharge medications will be authorized once you are discharged, as it is imperative that you return to your primary care physician (or establish a relationship with a primary care physician if you do not have one) for your aftercare needs so that they can reassess your need for medications and  monitor your lab values.  Discharge Instructions    Diet - low sodium heart healthy    Complete by:  As directed   No more than 1 L of fluid a day     Discharge instructions    Complete by:  As directed    Have sodium level checked by PCP in 1 wk     Increase activity slowly    Complete by:  As directed             Medication List    STOP taking these medications        ciprofloxacin 500 MG tablet  Commonly known as:  CIPRO      TAKE these medications        albuterol 108 (90 BASE) MCG/ACT inhaler  Commonly known as:  PROVENTIL HFA;VENTOLIN HFA  Inhale 2 puffs into the lungs every 6 (six) hours as needed for wheezing or shortness of breath.     antiseptic oral rinse 0.05 % Liqd solution  Commonly known as:  CPC / CETYLPYRIDINIUM CHLORIDE 0.05%  7 mLs by Mouth Rinse route 2 (two) times daily.     feeding supplement (RESOURCE BREEZE) Liqd  Take 1 Container by mouth 3 (three) times daily between meals.     furosemide 20 MG tablet  Commonly known as:  LASIX  Take 1 tablet (20 mg total) by mouth daily.     HYDROmorphone 2 MG tablet  Commonly known as:  DILAUDID  Take 0.5-1 tablets (1-2 mg total) by mouth every 3 (three) hours as needed for severe pain.     lactulose 10 GM/15ML solution  Commonly known as:  CHRONULAC  Take 30 mLs (20 g total) by mouth daily.     mirtazapine 7.5 MG tablet  Commonly known as:  REMERON  Take 7.5 mg by mouth at bedtime.     nadolol 20 MG tablet  Commonly known as:  CORGARD  Take 1 tablet (20 mg total) by mouth daily.     ondansetron 4 MG tablet  Commonly known as:  ZOFRAN  Take 1 tablet (4 mg total) by mouth every 6 (six) hours as needed for nausea.     pantoprazole 40 MG tablet  Commonly known as:  PROTONIX  Take 1 tablet (40 mg total) by mouth 2 (two) times daily.     potassium chloride 10 MEQ tablet  Commonly known as:  K-DUR  Take 1 tablet (10 mEq total) by mouth daily.       Allergies  Allergen Reactions  . Mango Flavor Rash    Severe rash       Follow-up Information    Follow up with [name], MD. Schedule an appointment as soon as possible for a visit in 1 week.   Specialty:  Internal Medicine   Why:  check Sodium  level in 1 wk   Contact information:   [address] [place] [address] [phone]        The results of significant diagnostics from this hospitalization (including imaging, microbiology, ancillary and laboratory) are listed below for reference.    Significant Diagnostic Studies: US Paracentesis  07/23/2014   INDICATION: Cirrhosis, hepatitis-C, recurrent ascites. Request is made for therapeutic paracentesis.  EXAM: ULTRASOUND-GUIDED THERAPEUTIC PARACENTESIS  COMPARISON:  Prior paracentesis on 07/17/2014  MEDICATIONS: None.  COMPLICATIONS: None immediate  TECHNIQUE: Informed written consent was obtained from the patient after a discussion of the risks, benefits and alternatives to treatment. A timeout was  performed prior to the initiation of the procedure.  Initial ultrasound scanning demonstrates a small amount of ascites within the right upper abdominal quadrant. The right upper abdomen was prepped and draped in the usual sterile fashion. 1% lidocaine was used for local anesthesia. Under direct ultrasound guidance, a 19 gauge, 10-cm, Yueh catheter was introduced. An ultrasound image was saved for documentation purposed. The paracentesis was performed. The catheter was removed and a dressing was applied. The patient tolerated the procedure well without immediate post procedural complication.  FINDINGS: A total of approximately 1.2 liters of blood-tinged fluid was removed.  IMPRESSION: Successful ultrasound-guided therapeutic paracentesis yielding 1.2 liters of peritoneal fluid.  Read by: Rowe Robert, PA-C   Electronically Signed   By: Aletta Edouard M.D.   On: 07/23/2014 12:18   US Paracentesis  07/17/2014   INDICATION: Cirrhosis, hepatitis C, recurrent ascites and request for therapeutic paracentesis.  EXAM: ULTRASOUND-GUIDED PARACENTESIS  COMPARISON:  07/09/14 paracentesis.  MEDICATIONS: None.  COMPLICATIONS: None immediate  TECHNIQUE: Informed written consent was obtained  from the patient after a discussion of the risks, benefits and alternatives to treatment. A timeout was performed prior to the initiation of the procedure.  Initial ultrasound scanning demonstrates a moderate amount of ascites within the right upper abdominal quadrant. The right upper abdomen was prepped and draped in the usual sterile fashion. 1% lidocaine was used for local anesthesia. Under direct ultrasound guidance, a 19 gauge, 7-cm, Yueh catheter was introduced. An ultrasound image was saved for documentation purposed. The paracentesis was performed. The catheter was removed and a dressing was applied. The patient tolerated the procedure well without immediate post procedural complication. Post procedure IV Albumin ordered.  FINDINGS: A total of approximately 4 liters of serous fluid was removed.  IMPRESSION: Successful ultrasound-guided paracentesis yielding 4 liters of peritoneal fluid.  Read By:  Tsosie Billing PA-C   Electronically Signed   By: Jacqulynn Cadet M.D.   On: 07/17/2014 11:22   US Paracentesis  07/09/2014   INDICATION: Cirrhosis, hepatitis C, recurrent ascites, request for paracentesis.  EXAM: ULTRASOUND-GUIDED PARACENTESIS  COMPARISON:  06/30/14 paracentesis.  MEDICATIONS: None.  COMPLICATIONS: None immediate  TECHNIQUE: Informed written consent was obtained from the patient after a discussion of the risks, benefits and alternatives to treatment. A timeout was performed prior to the initiation of the procedure.  Initial ultrasound scanning demonstrates a large amount of ascites within the right lower abdominal quadrant. The right lower abdomen was prepped and draped in the usual sterile fashion. 1% lidocaine was used for local anesthesia. Under direct ultrasound guidance, a 19 gauge, 10-cm, Yueh catheter was introduced. An ultrasound image was saved for documentation purposed. The paracentesis was performed. The catheter was removed and a dressing was applied. The patient tolerated the  procedure well without immediate post procedural complication.  FINDINGS: A total of approximately 4.6 liters of serous/amber colored fluid was removed.  IMPRESSION: Successful ultrasound-guided paracentesis yielding 4.6 liters of peritoneal fluid.  Read By:  Tsosie Billing PA-C   Electronically Signed   By: Daryll Brod M.D.   On: 07/09/2014 15:22    Microbiology: No results found for this or any previous visit (from the past 240 hour(s)).   Labs: Basic Metabolic Panel:  Recent Labs Lab 08/02/14 0639 08/03/14 0530 08/04/14 0518 08/05/14 0755 08/05/14 1200  NA 120* 120* 121* 121* 123*  K 4.2 4.4 4.6 3.9 4.1  CL 91* 88* 91* 94* 95*  CO2 24 25 26 23 24   GLUCOSE 94 85 79  136* 132*  BUN 10 11 10 10 9   CREATININE 0.53 0.52 0.50 0.47* 0.42*  CALCIUM 7.4* 7.4* 7.6* 7.1* 7.5*   Liver Function Tests:  Recent Labs Lab 07/29/14 2209 07/30/14 0540  AST 42* 36  ALT 33 29  ALKPHOS 105 80  BILITOT 5.2* 5.4*  PROT 5.6* 4.7*  ALBUMIN 2.4* 2.2*   No results for input(s): LIPASE, AMYLASE in the last 168 hours.  Recent Labs Lab 07/29/14 2209  AMMONIA 13   CBC:  Recent Labs Lab 07/29/14 2209 07/30/14 0540 07/31/14 0600 08/01/14 0542  WBC 12.9* 9.7 7.9 6.8  NEUTROABS 10.8*  --   --   --   HGB 8.9* 7.8* 8.2* 7.8*  HCT 25.8* 22.5* 23.9* 22.7*  MCV 91.8 91.5 91.6 91.9  PLT 64* 46* 48* 41*   Cardiac Enzymes: No results for input(s): CKTOTAL, CKMB, CKMBINDEX, TROPONINI in the last 168 hours. BNP: BNP (last 3 results) No results for input(s): BNP in the last 8760 hours.  ProBNP (last 3 results)  Recent Labs  05/23/14 0451  PROBNP 112.8    CBG:  Recent Labs Lab 07/30/14 2205  GLUCAP 121*       SignedDebbe Odea, MD [facility] 08/05/2014, 1:22 PM

## 2014-08-05 NOTE — Progress Notes (Signed)
Discharge instructions given to pt, verbalized understanding. Left the unit in stable condition. 

## 2014-08-05 NOTE — Consult Note (Signed)
Reason for Consult: HCV cirrhosis, Hyponatremia, [place] Referring Physician: [facility]  Foye Clock HPI: This is a 59 year old male who is well-known to me.  He was identified at [facility] to have a sodium of 114 and he was instructed to be admitted upon his return to [place].  He has been undergoing weekly paracenteses with IV albumin infusion and his sodium as been maintained in the 125-127 range.  The patients wife requested that I come to see him in the hospital.  She reported that she had to leave her husband when her father died.  During that absence in [place], she was not able to manage his fluids.  In advertently he had too much free water intake.  He was admitted with hypervolemic hyponatremia and with Renal assistance his sodium as increased to 120.    Past Medical History  Diagnosis Date  . Hepatitis C   . Anxiety     dx w/ depression 07/2008  . Colitis   . Alcohol abuse, in remission   . Headache(784.0)   . Allergic rhinitis   . Hepatic failure due to alcoholism   . Ulcerative colitis   . Coagulopathy 01/21/2014  . Severe protein-calorie malnutrition 01/21/2014  . Depression with anxiety 04/30/2014  . Ascites 01/21/2014  . ALLERGIC RHINITIS 04/28/2008    Qualifier: Diagnosis of  By: Larose Kells MD, [place] 04/04/2007    Annotation: cscope 08-2005 (had diarrhea) Qualifier: Diagnosis of  By: Larose Kells MD, [place] HYPERSOMNIA, ASSOCIATED WITH SLEEP APNEA 09/20/2009    Qualifier: Diagnosis of  By: Elsworth Soho MD, [place], HX OF 06/15/2009    Qualifier: Diagnosis of  By: Inda Castle FNP, [place]   . HTN (hypertension) 09/26/2010  . Liver failure 01/15/2014  . Hepatic cirrhosis due to chronic hepatitis C infection 01/15/2014  . Anemia 01/19/2014  . Thrombocytopenia 01/19/2014  . Encephalopathy, hepatic 01/21/2014  . Hyponatremia 04/30/2014  . Hypoalbuminemia 04/30/2014    Past Surgical History  Procedure Laterality Date  . Tonsillectomy    .  Paracentesis    . Colonoscopy N/A 05/09/2014    Procedure: COLONOSCOPY;  Surgeon: Beryle Beams, MD;  Location: [facility] ENDOSCOPY;  Service: Endoscopy;  Laterality: N/A;    Family History  Problem Relation Age of Onset  . Diabetes      GM  . Coronary artery disease    . Hyperlipidemia    . Prostate cancer    . Colon cancer Neg Hx     Social History:  reports that he has never smoked. He does not have any smokeless tobacco history on file. He reports that he does not drink alcohol or use illicit drugs.  Allergies:  Allergies  Allergen Reactions  . Mango Flavor Rash    Severe rash    Medications:  Scheduled: . antiseptic oral rinse  7 mL Mouth Rinse q12n4p  . chlorhexidine  15 mL Mouth Rinse BID  . feeding supplement (RESOURCE BREEZE)  1 Container Oral TID BM  . lactulose  20 g Oral Daily  . mirtazapine  7.5 mg Oral QHS  . sodium chloride  3 mL Intravenous Q12H   Continuous:   No results found for this or any previous visit (from the past 24 hour(s)).   No results found.  ROS:  As stated above in the HPI otherwise negative.  Blood pressure 103/66, pulse 92, temperature 97.6 F (36.4 C), temperature source Oral, resp.  rate 18, height 5\' 9"  (1.753 m), weight 63.186 kg (139 lb 4.8 oz), SpO2 100 %.    PE: Gen: NAD, Alert and Oriented HEENT:  [place]/AT, EOMI Neck: Supple, no LAD Lungs: CTA Bilaterally CV: RRR without M/G/R ABM: Soft, mild distension with ascites, +BS Ext: No C/C/E  Assessment/Plan: 1) Hypervolemic hyponatremia. 2) HCV cirrhosis with [place] with anticipated listing for transplant.   He appears to be stable and his sodium has improved appropriately.  I would be more comfortable if his sodium can be increased up to 122 before discharge.  His other hepatic issues appear to be stable.  Renal management is much appreciated.  Upon discharge I will have him follow up in the office with blood work.  Plan: 1) Continue to monitor sodium. 2) Continue with fluid  restriction.  Kaymen Adrian D 08/05/2014, 7:22 AM

## 2014-08-06 ENCOUNTER — Ambulatory Visit (HOSPITAL_COMMUNITY): Admission: RE | Admit: 2014-08-06 | Payer: Medicare Other | Source: Ambulatory Visit

## 2014-08-06 ENCOUNTER — Encounter (HOSPITAL_COMMUNITY): Payer: Medicare Other

## 2014-08-10 ENCOUNTER — Telehealth: Payer: Self-pay

## 2014-08-10 ENCOUNTER — Telehealth: Payer: Self-pay | Admitting: Internal Medicine

## 2014-08-10 NOTE — Telephone Encounter (Signed)
Was recently discharged from the hospital, needs a follow-up and a BMP. If he does not have an appointment with other doctors (sees GI) please arrange something in this office

## 2014-08-10 NOTE — Telephone Encounter (Signed)
Order request received [facility] for physical therapy. Request labeled and placed in Dr. Ethel Rana red folder for review.

## 2014-08-11 NOTE — Telephone Encounter (Signed)
I haven't seen this patient in 3 years, I like to help him but I don't know if I am legally able to sign PT orders since I haven't have  face-to-face time with the patient. Please find out from management if that is possible

## 2014-08-11 NOTE — Telephone Encounter (Signed)
Please advise 

## 2014-08-12 DIAGNOSIS — B182 Chronic viral hepatitis C: Secondary | ICD-10-CM | POA: Diagnosis not present

## 2014-08-12 DIAGNOSIS — K7469 Other cirrhosis of liver: Secondary | ICD-10-CM | POA: Diagnosis not present

## 2014-08-12 NOTE — Telephone Encounter (Signed)
Called patient. No answer.  Unable to leave a message due to mailbox not being setup.  Per Dr. Larose Kells, pt is not very mobile and may not be able to come in for an appointment. Will try calling again to see if a face to face is possible.

## 2014-08-12 NOTE — Telephone Encounter (Signed)
Patient needs to be seen in order to continue approval on orders. Did not schedule hospital follow-up as requested.

## 2014-08-13 ENCOUNTER — Ambulatory Visit (HOSPITAL_COMMUNITY): Payer: Medicare Other

## 2014-08-13 ENCOUNTER — Encounter (HOSPITAL_COMMUNITY): Payer: Medicare Other

## 2014-08-13 NOTE — Telephone Encounter (Signed)
Left message for call back at [phone].

## 2014-08-14 DIAGNOSIS — D696 Thrombocytopenia, unspecified: Secondary | ICD-10-CM | POA: Diagnosis not present

## 2014-08-14 DIAGNOSIS — K766 Portal hypertension: Secondary | ICD-10-CM | POA: Diagnosis not present

## 2014-08-14 DIAGNOSIS — Z01818 Encounter for other preprocedural examination: Secondary | ICD-10-CM | POA: Diagnosis not present

## 2014-08-14 DIAGNOSIS — K7469 Other cirrhosis of liver: Secondary | ICD-10-CM | POA: Diagnosis not present

## 2014-08-14 DIAGNOSIS — C220 Liver cell carcinoma: Secondary | ICD-10-CM | POA: Diagnosis not present

## 2014-08-14 NOTE — Telephone Encounter (Signed)
Hospital follow up appointment scheduled with Dr. Larose Kells on 2.23.16 at 11:30 am.

## 2014-08-14 NOTE — Telephone Encounter (Signed)
Patient wife returned call. Best # [phone]

## 2014-08-14 NOTE — Telephone Encounter (Addendum)
Admit date: 07/29/2014 Discharge date: 08/05/2014  Reason for admission: Hyponatremia  Recommendations for Outpatient Follow-up:  1. Check Sodium in 1 wk  Spoke with patient's wife.  Wife states that patient is stable, but is in a need a transplant.  Per Wife, unable to receive transplant until oral infection clears.  Pt is scheduled for oral/dental surgery on 08/20/14.    Hospital follow up appointment scheduled with Dr. Larose Kells on 08/18/14 @ 11:30 am.

## 2014-08-17 ENCOUNTER — Ambulatory Visit (HOSPITAL_COMMUNITY)
Admission: RE | Admit: 2014-08-17 | Discharge: 2014-08-17 | Disposition: A | Discharge status: Home / Self Care | Payer: Medicare Other | Source: Ambulatory Visit | Attending: Gastroenterology | Admitting: Gastroenterology

## 2014-08-17 DIAGNOSIS — R188 Other ascites: Secondary | ICD-10-CM

## 2014-08-17 MED ORDER — ALBUMIN HUMAN 25 % IV SOLN
25.0000 g | Freq: Once | INTRAVENOUS | Status: DC
Start: 2014-08-17 — End: 2014-08-18

## 2014-08-17 MED ORDER — LIDOCAINE HCL (PF) 1 % IJ SOLN
INTRAMUSCULAR | Status: AC
  Filled 2014-08-17: qty 10

## 2014-08-17 MED ORDER — ALBUMIN HUMAN 25 % IV SOLN
25.0000 g | Freq: Once | INTRAVENOUS | Status: AC
  Administered 2014-08-17: 25 g via INTRAVENOUS
  Filled 2014-08-17 (×2): qty 100

## 2014-08-17 NOTE — Procedures (Signed)
  US guided LLQ para 4.2 liters amber fluid  25 gr post procedure IV albumin per MD

## 2014-08-18 ENCOUNTER — Ambulatory Visit: Payer: Medicare Other | Admitting: Internal Medicine

## 2014-08-18 ENCOUNTER — Telehealth: Payer: Self-pay | Admitting: Internal Medicine

## 2014-08-18 NOTE — Telephone Encounter (Signed)
Ok, don't charge no show fee

## 2014-08-18 NOTE — Telephone Encounter (Signed)
Emergency contact Frisk,Alba canceled appointment for today, pt is having all teeth removed and stated pt does not need a hospital follow up, pt is following up with gastro. Peloso,Alba  schedule a general check up with MD 08/28/14.

## 2014-08-19 ENCOUNTER — Encounter: Payer: Self-pay | Admitting: Internal Medicine

## 2014-08-20 ENCOUNTER — Ambulatory Visit (HOSPITAL_COMMUNITY): Admission: RE | Admit: 2014-08-20 | Payer: Medicare Other | Source: Ambulatory Visit

## 2014-08-20 ENCOUNTER — Encounter (HOSPITAL_COMMUNITY)
Admission: RE | Admit: 2014-08-20 | Payer: Medicare Other | Source: Ambulatory Visit | Attending: Gastroenterology | Admitting: Gastroenterology

## 2014-08-20 DIAGNOSIS — R918 Other nonspecific abnormal finding of lung field: Secondary | ICD-10-CM | POA: Diagnosis not present

## 2014-08-20 DIAGNOSIS — J984 Other disorders of lung: Secondary | ICD-10-CM | POA: Diagnosis not present

## 2014-08-20 DIAGNOSIS — K5190 Ulcerative colitis, unspecified, without complications: Secondary | ICD-10-CM | POA: Diagnosis not present

## 2014-08-20 DIAGNOSIS — D62 Acute posthemorrhagic anemia: Secondary | ICD-10-CM | POA: Diagnosis not present

## 2014-08-20 DIAGNOSIS — G9340 Encephalopathy, unspecified: Secondary | ICD-10-CM | POA: Diagnosis not present

## 2014-08-20 DIAGNOSIS — R791 Abnormal coagulation profile: Secondary | ICD-10-CM | POA: Diagnosis not present

## 2014-08-20 DIAGNOSIS — C220 Liver cell carcinoma: Secondary | ICD-10-CM | POA: Diagnosis not present

## 2014-08-20 DIAGNOSIS — J9602 Acute respiratory failure with hypercapnia: Secondary | ICD-10-CM | POA: Diagnosis not present

## 2014-08-20 DIAGNOSIS — D696 Thrombocytopenia, unspecified: Secondary | ICD-10-CM | POA: Diagnosis not present

## 2014-08-20 DIAGNOSIS — R0689 Other abnormalities of breathing: Secondary | ICD-10-CM | POA: Diagnosis not present

## 2014-08-20 DIAGNOSIS — J690 Pneumonitis due to inhalation of food and vomit: Secondary | ICD-10-CM | POA: Diagnosis not present

## 2014-08-20 DIAGNOSIS — J9690 Respiratory failure, unspecified, unspecified whether with hypoxia or hypercapnia: Secondary | ICD-10-CM | POA: Diagnosis not present

## 2014-08-20 DIAGNOSIS — Z4682 Encounter for fitting and adjustment of non-vascular catheter: Secondary | ICD-10-CM | POA: Diagnosis not present

## 2014-08-20 DIAGNOSIS — K7290 Hepatic failure, unspecified without coma: Secondary | ICD-10-CM | POA: Diagnosis not present

## 2014-08-20 DIAGNOSIS — K056 Periodontal disease, unspecified: Secondary | ICD-10-CM | POA: Diagnosis not present

## 2014-08-20 DIAGNOSIS — K7469 Other cirrhosis of liver: Secondary | ICD-10-CM | POA: Diagnosis not present

## 2014-08-20 DIAGNOSIS — B182 Chronic viral hepatitis C: Secondary | ICD-10-CM | POA: Diagnosis not present

## 2014-08-20 DIAGNOSIS — E440 Moderate protein-calorie malnutrition: Secondary | ICD-10-CM | POA: Diagnosis not present

## 2014-08-20 DIAGNOSIS — D684 Acquired coagulation factor deficiency: Secondary | ICD-10-CM | POA: Diagnosis not present

## 2014-08-20 DIAGNOSIS — R634 Abnormal weight loss: Secondary | ICD-10-CM | POA: Diagnosis not present

## 2014-08-20 DIAGNOSIS — J90 Pleural effusion, not elsewhere classified: Secondary | ICD-10-CM | POA: Diagnosis not present

## 2014-08-20 DIAGNOSIS — J9811 Atelectasis: Secondary | ICD-10-CM | POA: Diagnosis not present

## 2014-08-20 DIAGNOSIS — R6520 Severe sepsis without septic shock: Secondary | ICD-10-CM | POA: Diagnosis not present

## 2014-08-20 DIAGNOSIS — J80 Acute respiratory distress syndrome: Secondary | ICD-10-CM | POA: Diagnosis not present

## 2014-08-20 DIAGNOSIS — R34 Anuria and oliguria: Secondary | ICD-10-CM | POA: Diagnosis not present

## 2014-08-20 DIAGNOSIS — N179 Acute kidney failure, unspecified: Secondary | ICD-10-CM | POA: Diagnosis not present

## 2014-08-20 DIAGNOSIS — R1312 Dysphagia, oropharyngeal phase: Secondary | ICD-10-CM | POA: Diagnosis not present

## 2014-08-20 DIAGNOSIS — K766 Portal hypertension: Secondary | ICD-10-CM | POA: Diagnosis not present

## 2014-08-20 DIAGNOSIS — Z515 Encounter for palliative care: Secondary | ICD-10-CM | POA: Diagnosis not present

## 2014-08-20 DIAGNOSIS — J9621 Acute and chronic respiratory failure with hypoxia: Secondary | ICD-10-CM | POA: Diagnosis not present

## 2014-08-20 DIAGNOSIS — E870 Hyperosmolality and hypernatremia: Secondary | ICD-10-CM | POA: Diagnosis not present

## 2014-08-20 DIAGNOSIS — E871 Hypo-osmolality and hyponatremia: Secondary | ICD-10-CM | POA: Diagnosis not present

## 2014-08-20 DIAGNOSIS — A419 Sepsis, unspecified organism: Secondary | ICD-10-CM | POA: Diagnosis not present

## 2014-08-20 DIAGNOSIS — M270 Developmental disorders of jaws: Secondary | ICD-10-CM | POA: Diagnosis not present

## 2014-08-20 DIAGNOSIS — J811 Chronic pulmonary edema: Secondary | ICD-10-CM | POA: Diagnosis not present

## 2014-08-20 DIAGNOSIS — R188 Other ascites: Secondary | ICD-10-CM | POA: Diagnosis not present

## 2014-08-20 DIAGNOSIS — K7460 Unspecified cirrhosis of liver: Secondary | ICD-10-CM | POA: Diagnosis not present

## 2014-08-20 DIAGNOSIS — K767 Hepatorenal syndrome: Secondary | ICD-10-CM | POA: Diagnosis not present

## 2014-08-20 DIAGNOSIS — R930 Abnormal findings on diagnostic imaging of skull and head, not elsewhere classified: Secondary | ICD-10-CM | POA: Diagnosis not present

## 2014-08-20 DIAGNOSIS — E872 Acidosis: Secondary | ICD-10-CM | POA: Diagnosis not present

## 2014-08-20 DIAGNOSIS — D72829 Elevated white blood cell count, unspecified: Secondary | ICD-10-CM | POA: Diagnosis not present

## 2014-08-20 DIAGNOSIS — R600 Localized edema: Secondary | ICD-10-CM | POA: Diagnosis not present

## 2014-08-20 DIAGNOSIS — R4182 Altered mental status, unspecified: Secondary | ICD-10-CM | POA: Diagnosis not present

## 2014-08-20 DIAGNOSIS — E873 Alkalosis: Secondary | ICD-10-CM | POA: Diagnosis not present

## 2014-08-20 DIAGNOSIS — J9601 Acute respiratory failure with hypoxia: Secondary | ICD-10-CM | POA: Diagnosis not present

## 2014-08-20 DIAGNOSIS — R410 Disorientation, unspecified: Secondary | ICD-10-CM | POA: Diagnosis not present

## 2014-08-20 DIAGNOSIS — Z452 Encounter for adjustment and management of vascular access device: Secondary | ICD-10-CM | POA: Diagnosis not present

## 2014-08-21 DIAGNOSIS — K7290 Hepatic failure, unspecified without coma: Secondary | ICD-10-CM | POA: Diagnosis not present

## 2014-08-21 DIAGNOSIS — C220 Liver cell carcinoma: Secondary | ICD-10-CM | POA: Diagnosis not present

## 2014-08-21 DIAGNOSIS — D62 Acute posthemorrhagic anemia: Secondary | ICD-10-CM | POA: Diagnosis not present

## 2014-08-27 ENCOUNTER — Ambulatory Visit (HOSPITAL_COMMUNITY): Admission: RE | Admit: 2014-08-27 | Payer: Medicare Other | Source: Ambulatory Visit

## 2014-08-27 ENCOUNTER — Encounter (HOSPITAL_COMMUNITY): Payer: Medicare Other

## 2014-08-28 ENCOUNTER — Ambulatory Visit: Payer: Medicare Other | Admitting: Internal Medicine

## 2014-09-03 ENCOUNTER — Ambulatory Visit (HOSPITAL_COMMUNITY): Admission: RE | Admit: 2014-09-03 | Payer: Medicare Other | Source: Ambulatory Visit

## 2014-09-03 ENCOUNTER — Encounter (HOSPITAL_COMMUNITY): Payer: Medicare Other

## 2014-09-10 ENCOUNTER — Ambulatory Visit (HOSPITAL_COMMUNITY): Admission: RE | Admit: 2014-09-10 | Payer: Medicare Other | Source: Ambulatory Visit

## 2014-09-10 ENCOUNTER — Encounter (HOSPITAL_COMMUNITY): Payer: Medicare Other | Attending: Gastroenterology

## 2014-09-10 DIAGNOSIS — R188 Other ascites: Secondary | ICD-10-CM | POA: Insufficient documentation

## 2014-09-17 ENCOUNTER — Ambulatory Visit (HOSPITAL_COMMUNITY): Payer: Medicare Other

## 2014-09-17 ENCOUNTER — Encounter (HOSPITAL_COMMUNITY): Payer: Medicare Other

## 2014-09-24 ENCOUNTER — Ambulatory Visit (HOSPITAL_COMMUNITY): Payer: Medicare Other

## 2014-09-24 ENCOUNTER — Encounter (HOSPITAL_COMMUNITY): Payer: Medicare Other

## 2014-09-25 DEATH — deceased

## 2014-10-01 ENCOUNTER — Ambulatory Visit (HOSPITAL_COMMUNITY): Payer: Medicare Other

## 2014-10-01 ENCOUNTER — Encounter (HOSPITAL_COMMUNITY): Payer: Medicare Other

## 2014-10-08 ENCOUNTER — Encounter (HOSPITAL_COMMUNITY): Payer: Medicare Other

## 2014-10-08 ENCOUNTER — Ambulatory Visit (HOSPITAL_COMMUNITY): Payer: Medicare Other

## 2014-10-15 ENCOUNTER — Encounter (HOSPITAL_COMMUNITY): Payer: Medicare Other

## 2014-10-15 ENCOUNTER — Ambulatory Visit (HOSPITAL_COMMUNITY): Payer: Medicare Other

## 2014-10-22 ENCOUNTER — Encounter (HOSPITAL_COMMUNITY): Payer: Medicare Other

## 2014-10-22 ENCOUNTER — Ambulatory Visit (HOSPITAL_COMMUNITY): Payer: Medicare Other

## 2015-04-18 IMAGING — US US PARACENTESIS
1 series · 6 of 6 positions shown · non-contrast
Comparison: PRIOR PARACENTESIS ON 01/19/2014

MEDICATIONS:
None.

COMPLICATIONS:
None immediate

INDICATION: Cirrhosis, hepatitis-C, recurrent ascites. Request is made for
diagnostic and therapeutic paracentesis up to 5 liters.

EXAM:
ULTRASOUND-GUIDED DIAGNOSTIC AND THERAPEUTIC PARACENTESIS
TECHNIQUE: Informed written consent was obtained from the patient after a
discussion of the risks, benefits and alternatives to treatment. A
timeout was performed prior to the initiation of the procedure.

[Series 1: us paracentesis · 0.27mm/px · 6 of 6 slices shown]
[im 1/6]
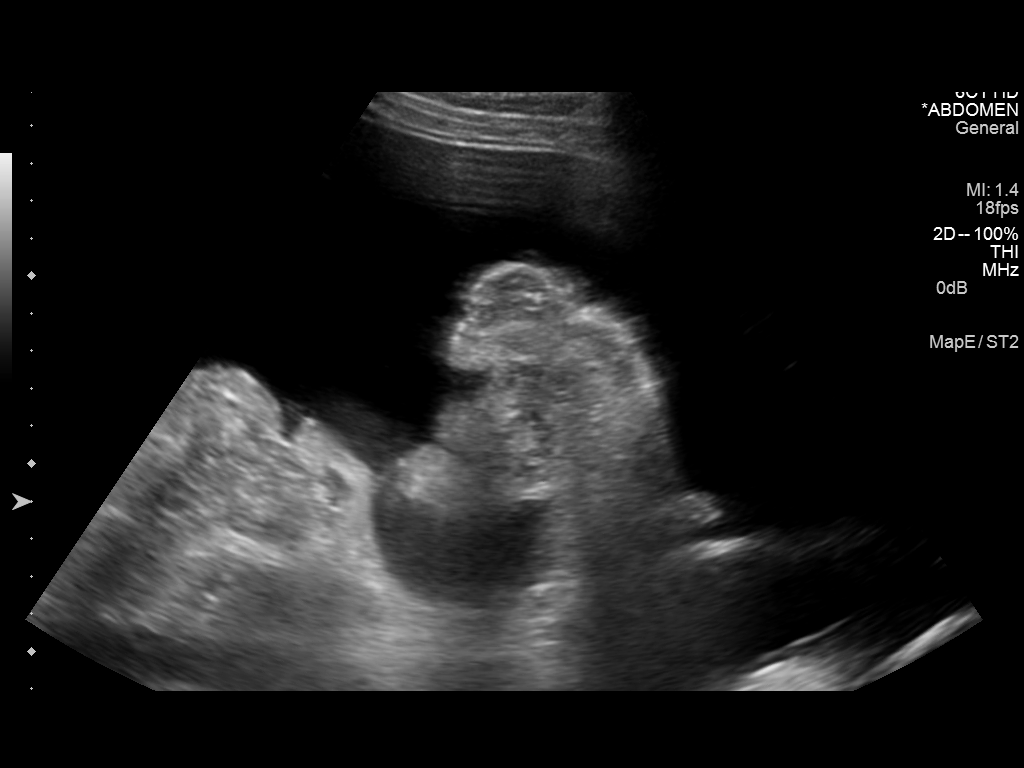
[im 2/6]
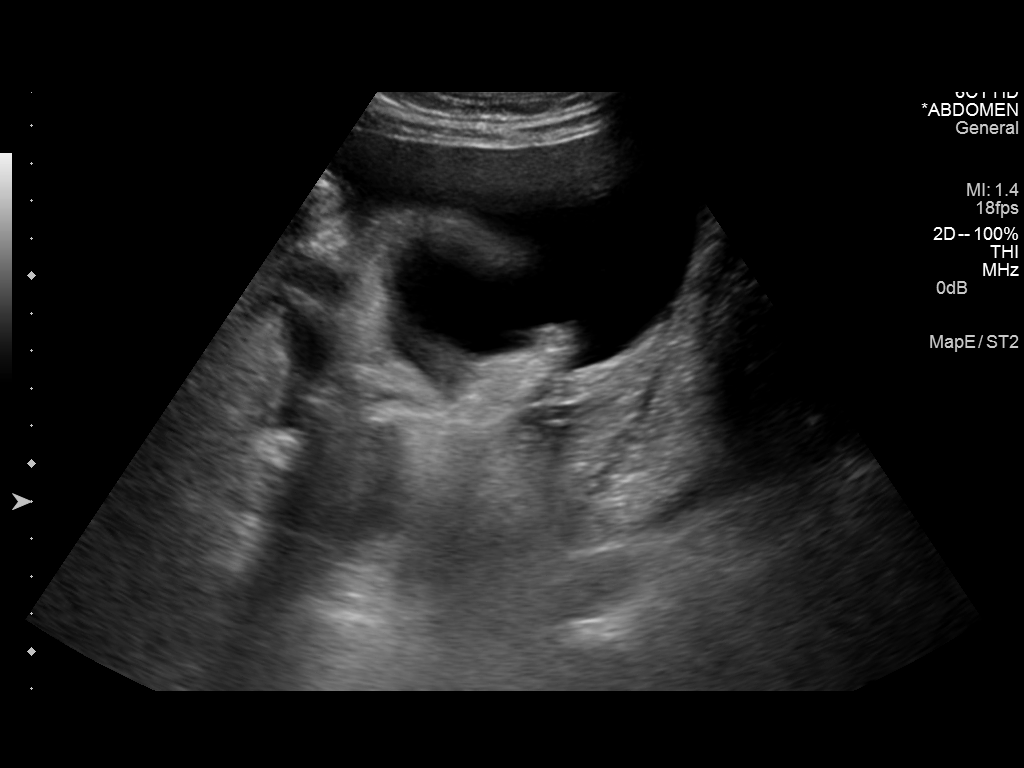
[im 3/6]
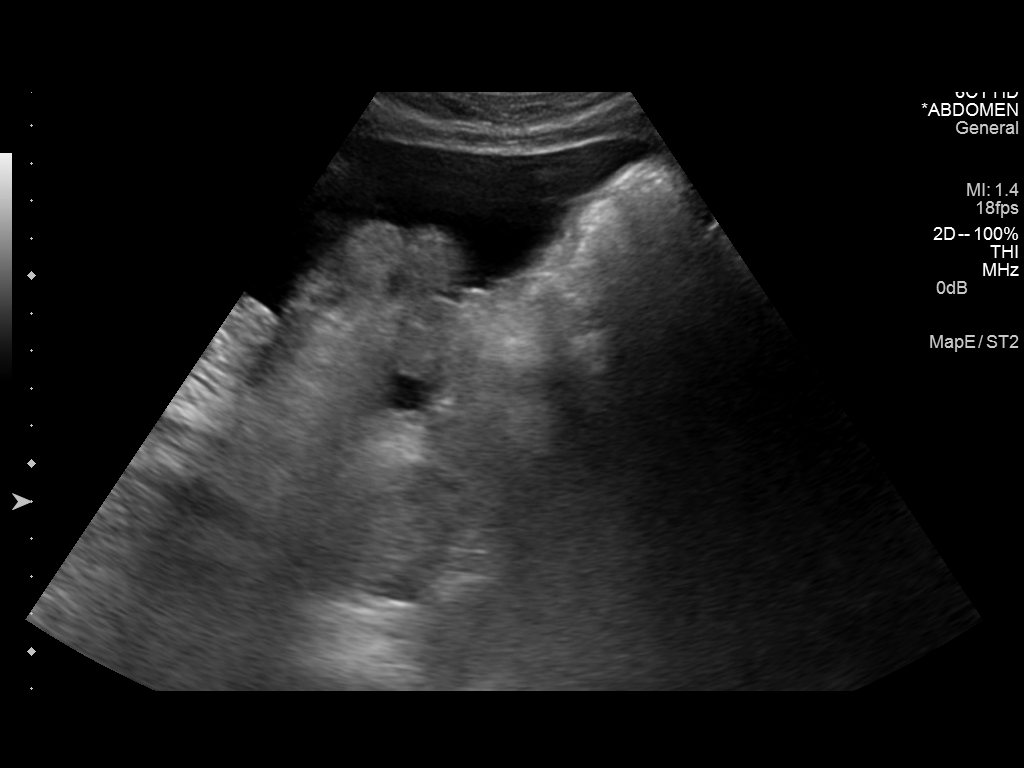
[im 4/6]
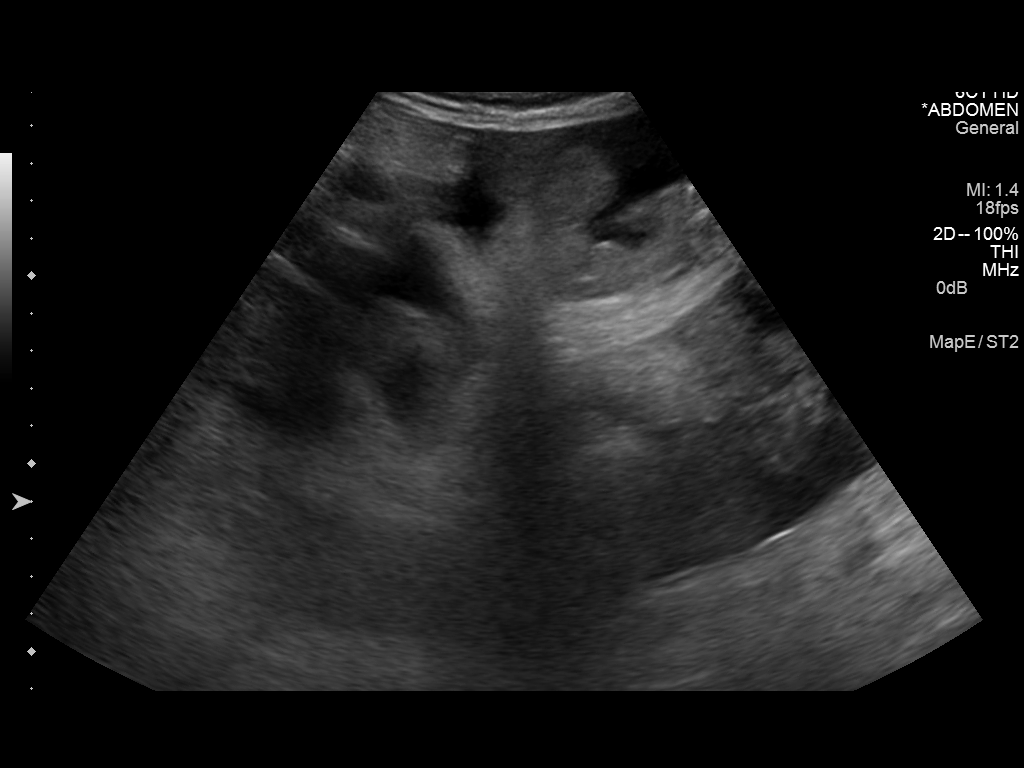
[im 5/6]
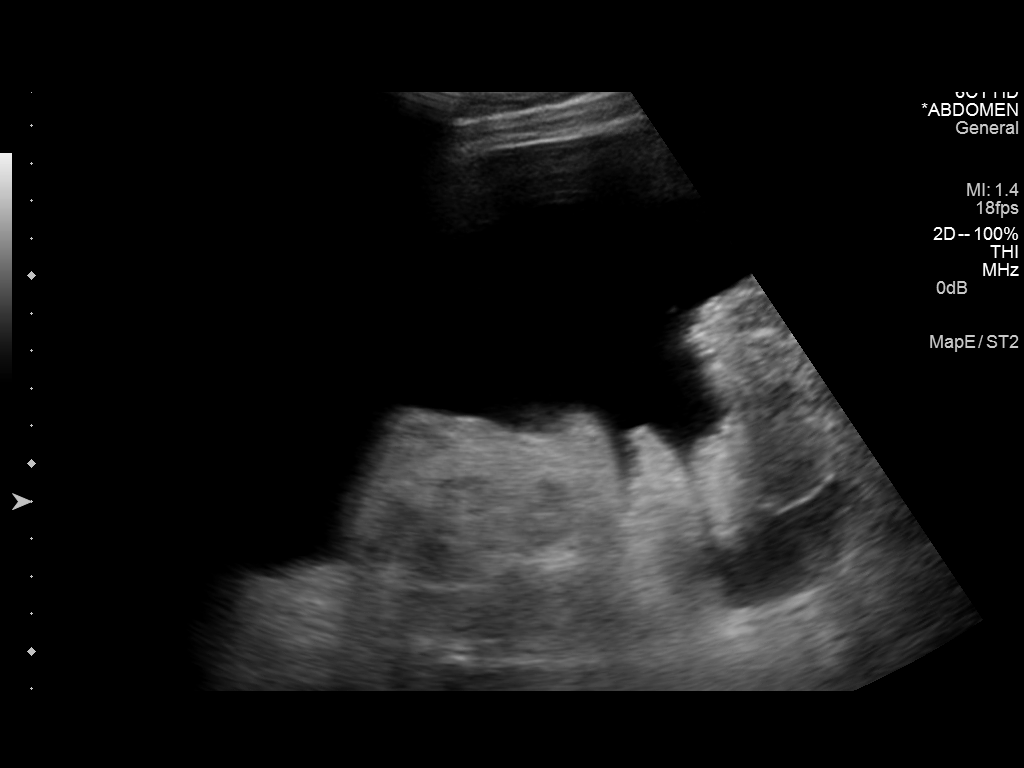
[im 6/6]
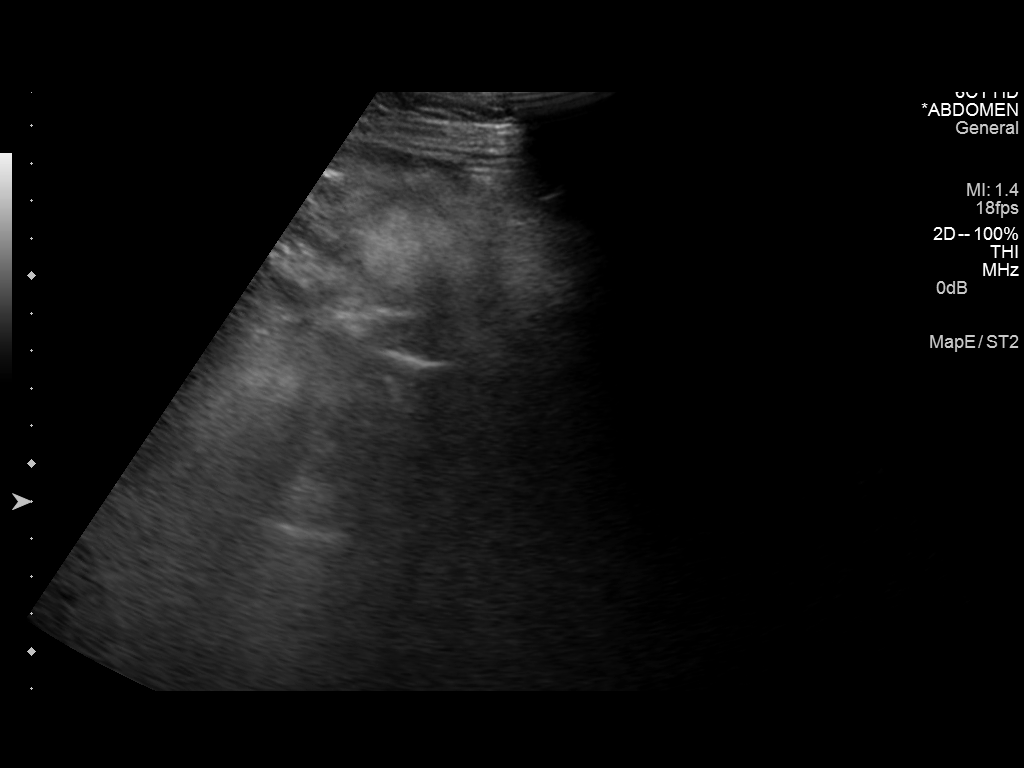

[6 of 6 positions shown; findings below may reference images not displayed]

Initial ultrasound scanning demonstrates a large amount of ascites
within the right lower abdominal quadrant. The right lower abdomen
was prepped and draped in the usual sterile fashion. 1% lidocaine
was used for local anesthesia. Under direct ultrasound guidance, a
19 gauge, 7-cm, Yueh catheter was introduced. An ultrasound image
was saved for documentation purposed. The paracentesis was
performed. The catheter was removed and a dressing was applied. The
patient tolerated the procedure well without immediate post
procedural complication.
FINDINGS: A total of approximately 5 liters liters of slightly turbid, yellow
fluid was removed. Samples were sent to the laboratory as requested
by the clinical team.
IMPRESSION: Successful ultrasound-guided diagnostic and therapeutic paracentesis
yielding 5 liters of peritoneal fluid.

## 2015-04-24 IMAGING — US US PARACENTESIS
1 series · 5 of 5 positions shown · non-contrast
Comparison: Previous paracentesis

CLINICAL DATA: Cirrhosis, hepatitis-C, recurrent ascites. Request
for therapeutic paracentesis.

EXAM:
ULTRASOUND GUIDED PARACENTESIS

[Series 1: us paracentesis · 0.27mm/px · 5 of 5 slices shown]
[im 1/5]
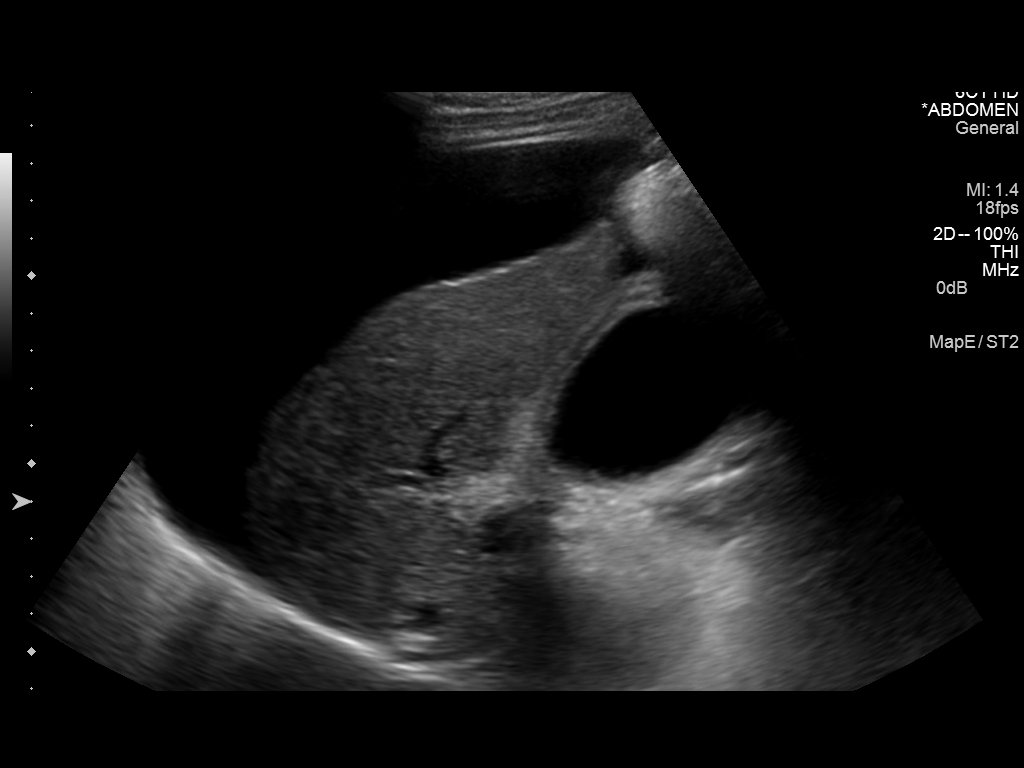
[im 2/5]
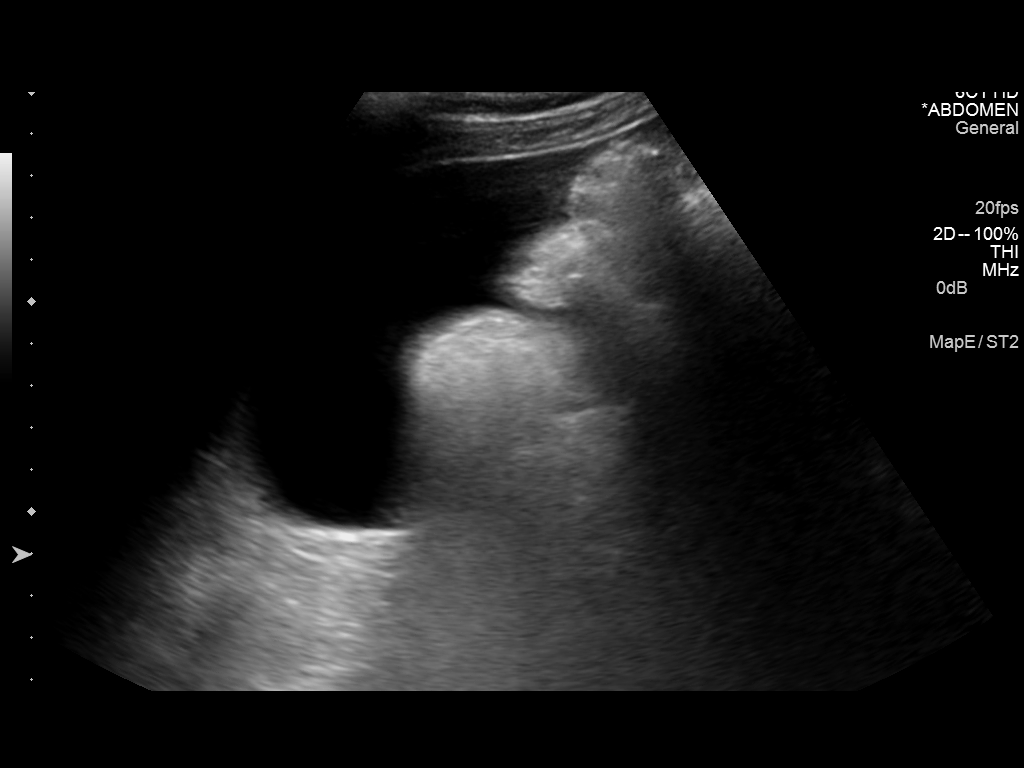
[im 3/5]
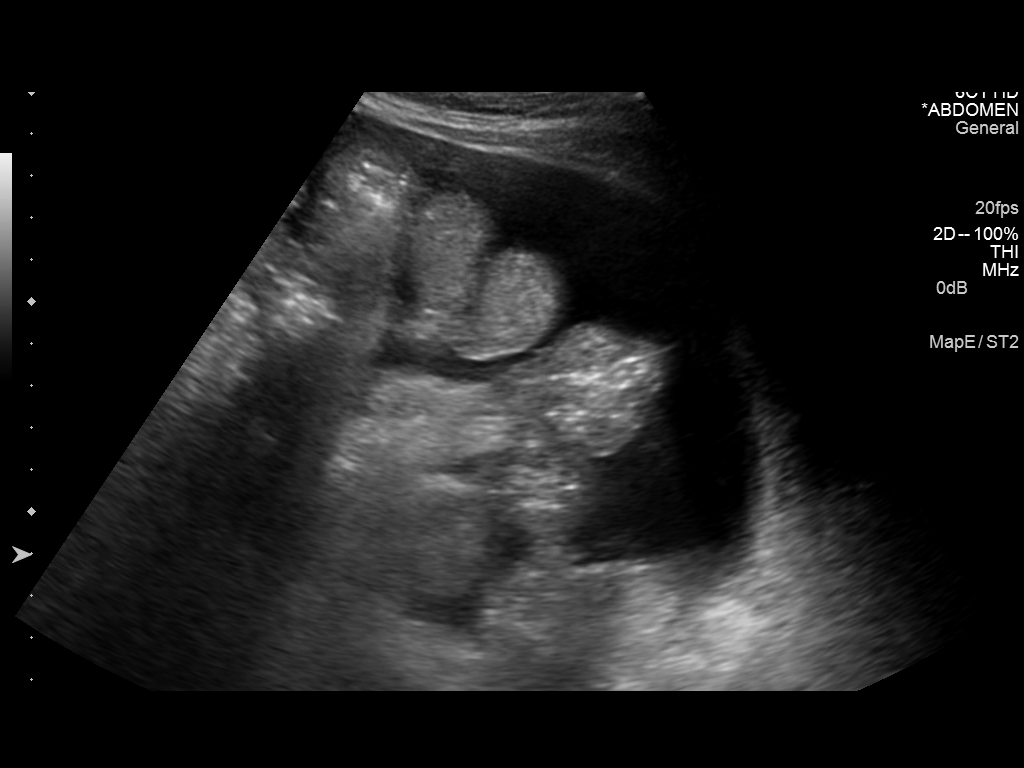
[im 4/5]
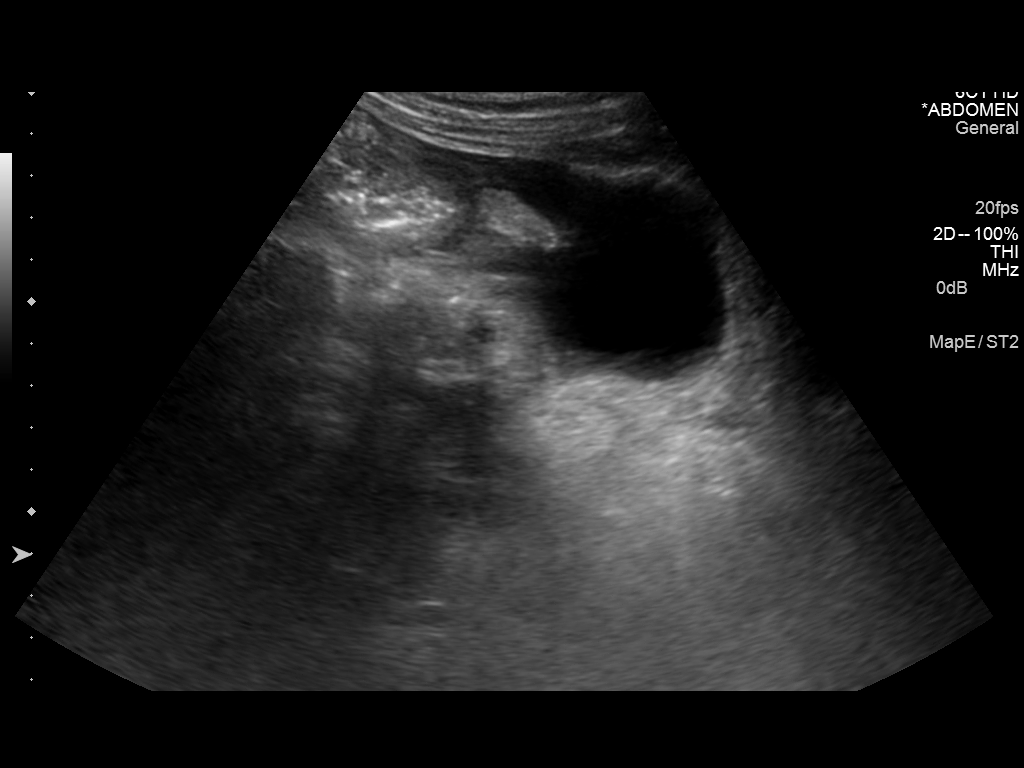
[im 5/5]
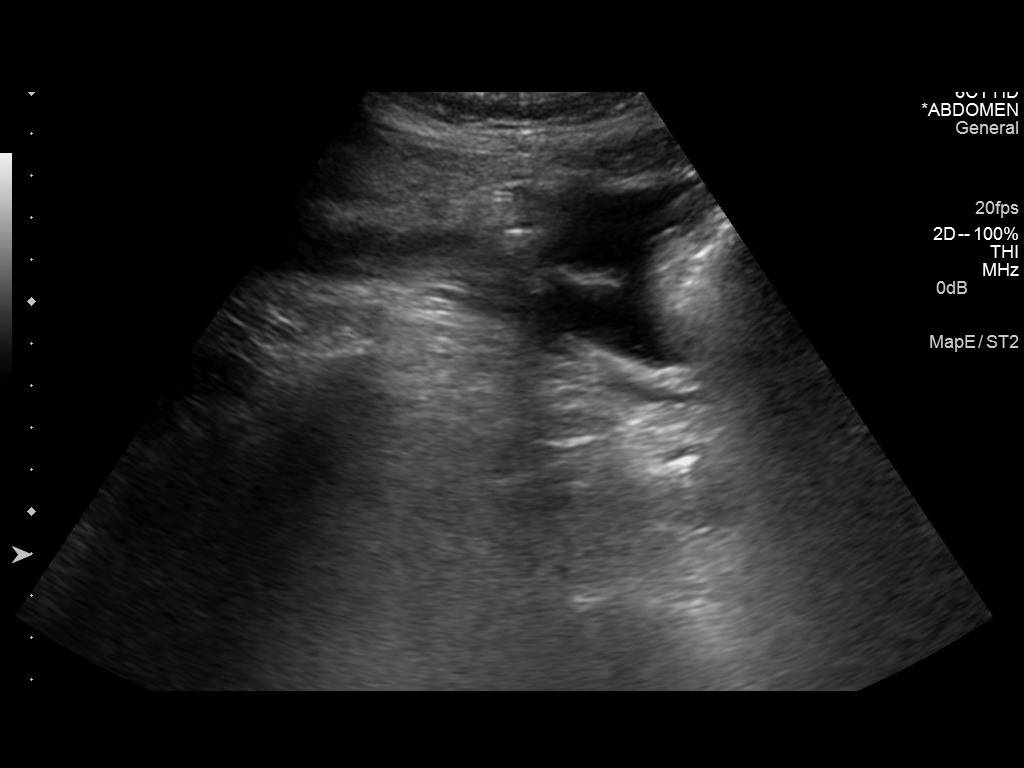

[5 of 5 positions shown; findings below may reference images not displayed]

PROCEDURE:
An ultrasound guided paracentesis was thoroughly discussed with the
patient and questions answered. The benefits, risks, alternatives
and complications were also discussed. The patient understands and
wishes to proceed with the procedure. Written consent was obtained.

Ultrasound was performed to localize and mark an adequate pocket of
fluid in the right lower quadrant of the abdomen. The area was then
prepped and draped in the normal sterile fashion. 1% Lidocaine was
used for local anesthesia. Under ultrasound guidance a 6 French
Safe-T-Centesis catheter was introduced. Paracentesis was performed.
The catheter was removed and a dressing applied.

COMPLICATIONS:
None
FINDINGS: A total of approximately 3.8 L of clear yellow fluid was removed. A
fluid sample was not sent for laboratory analysis.
IMPRESSION: Successful ultrasound guided paracentesis yielding 3.8 L of ascites.

## 2015-05-21 IMAGING — US US PARACENTESIS
1 series · 6 of 6 positions shown · non-contrast
Comparison: 05/24/14.

MEDICATIONS:
None.

COMPLICATIONS:
None immediate

INDICATION: Hepatitis C, cirrhosis, recurrent ascites, request for paracentesis.

EXAM:
ULTRASOUND-GUIDED PARACENTESIS
TECHNIQUE: Informed written consent was obtained from the patient after a
discussion of the risks, benefits and alternatives to treatment. A
timeout was performed prior to the initiation of the procedure.

[Series 1: us paracentesis · 0.27mm/px · 6 of 6 slices shown]
[im 1/6]
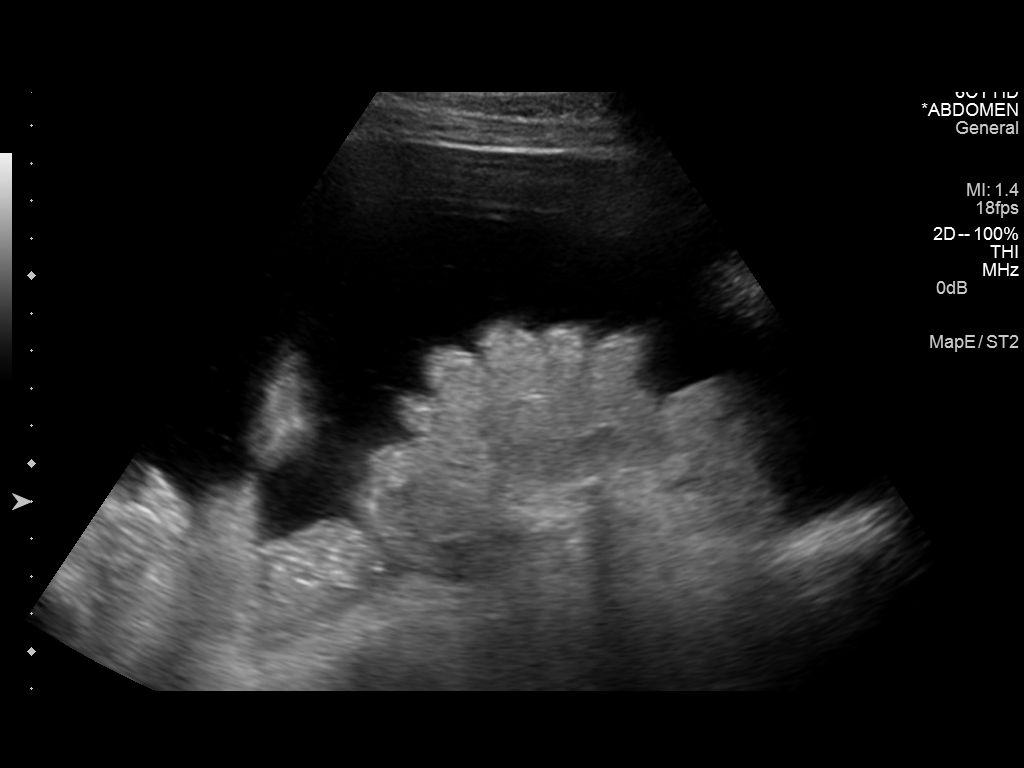
[im 2/6]
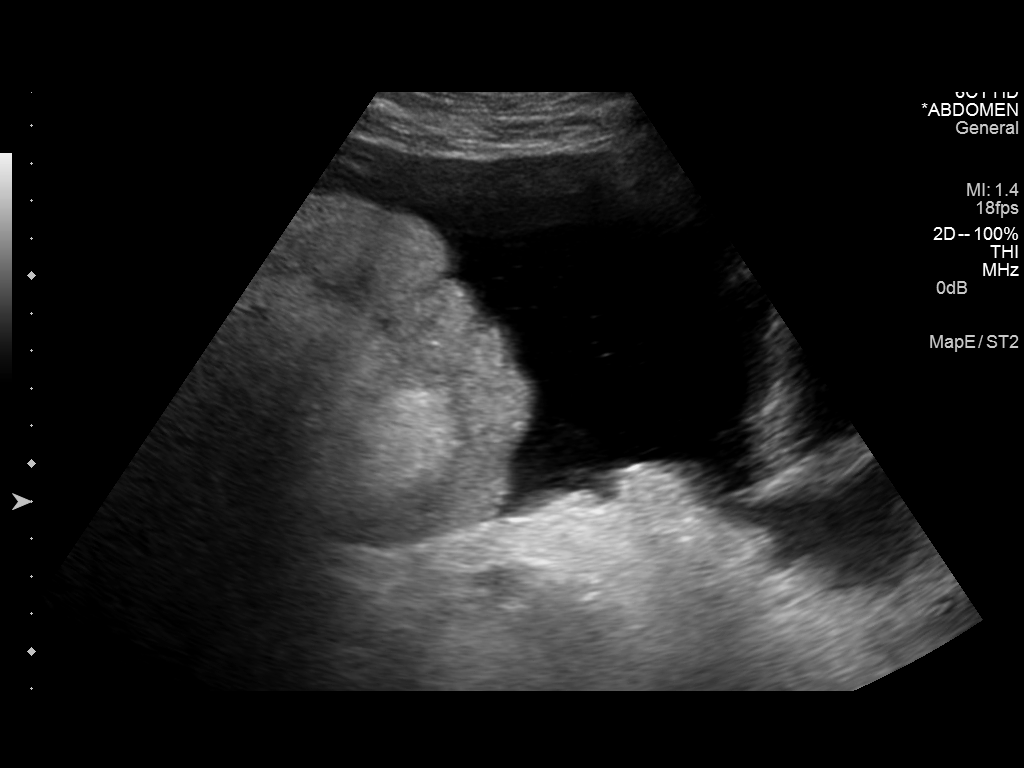
[im 3/6]
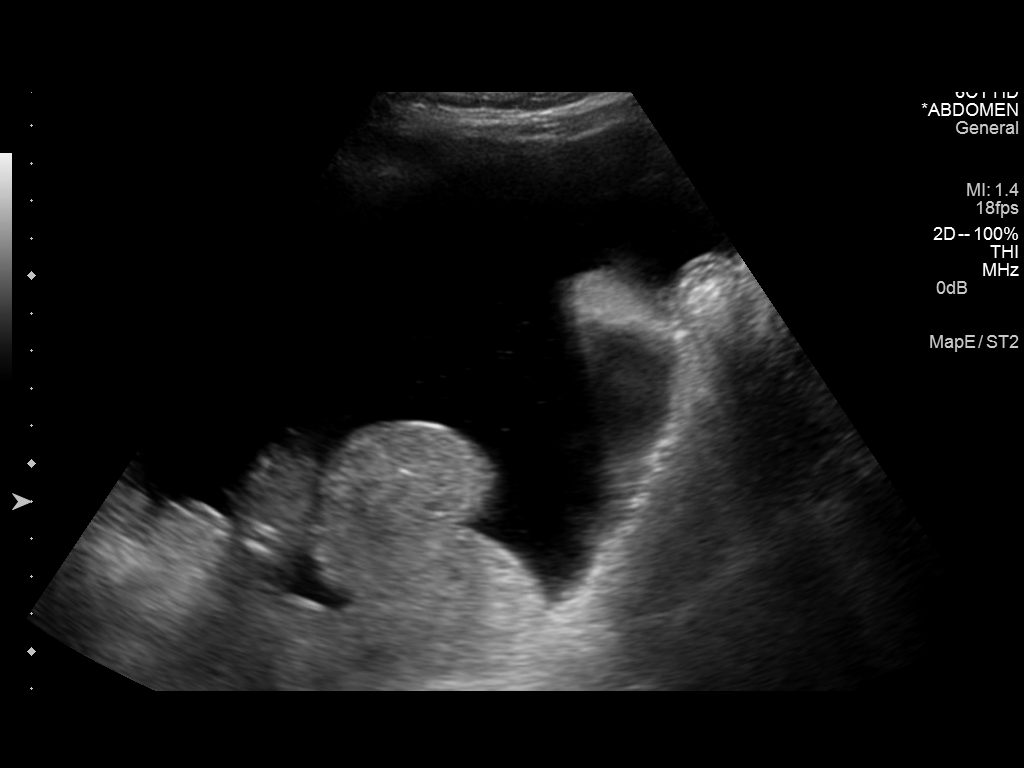
[im 4/6]
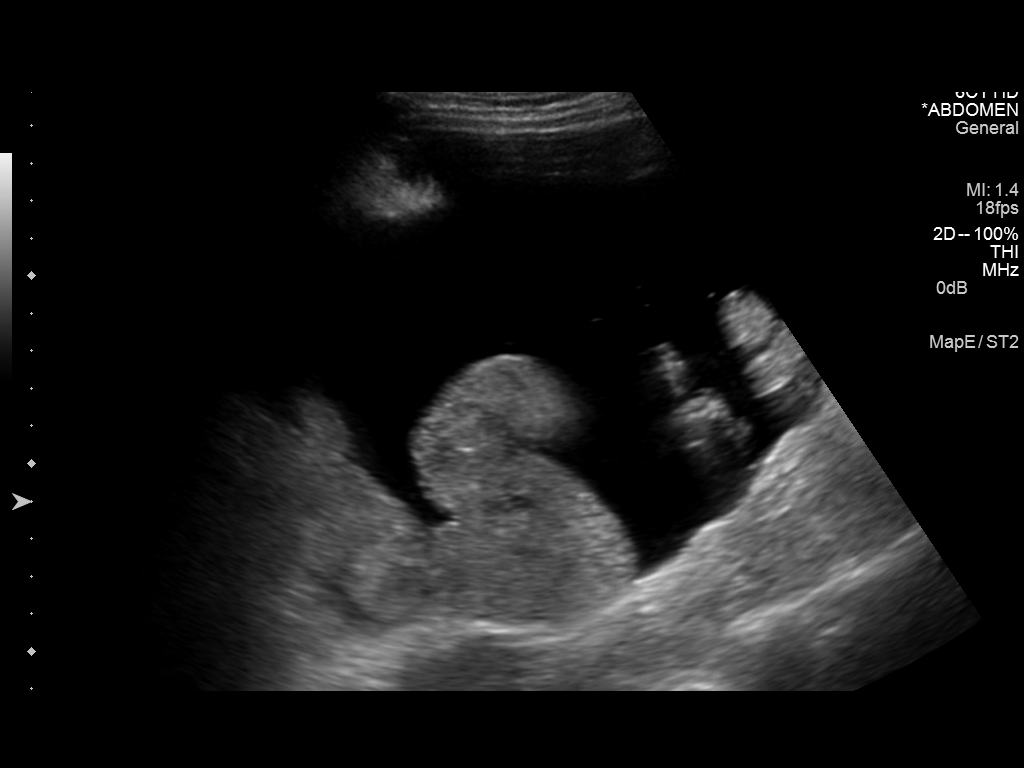
[im 5/6]
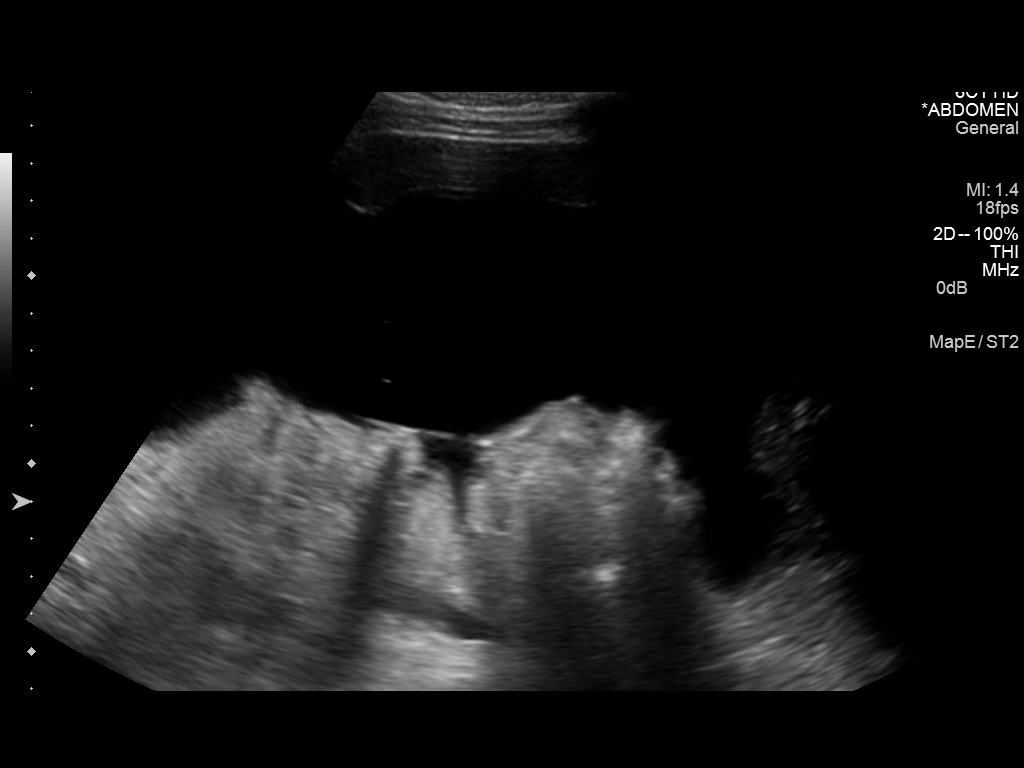
[im 6/6]
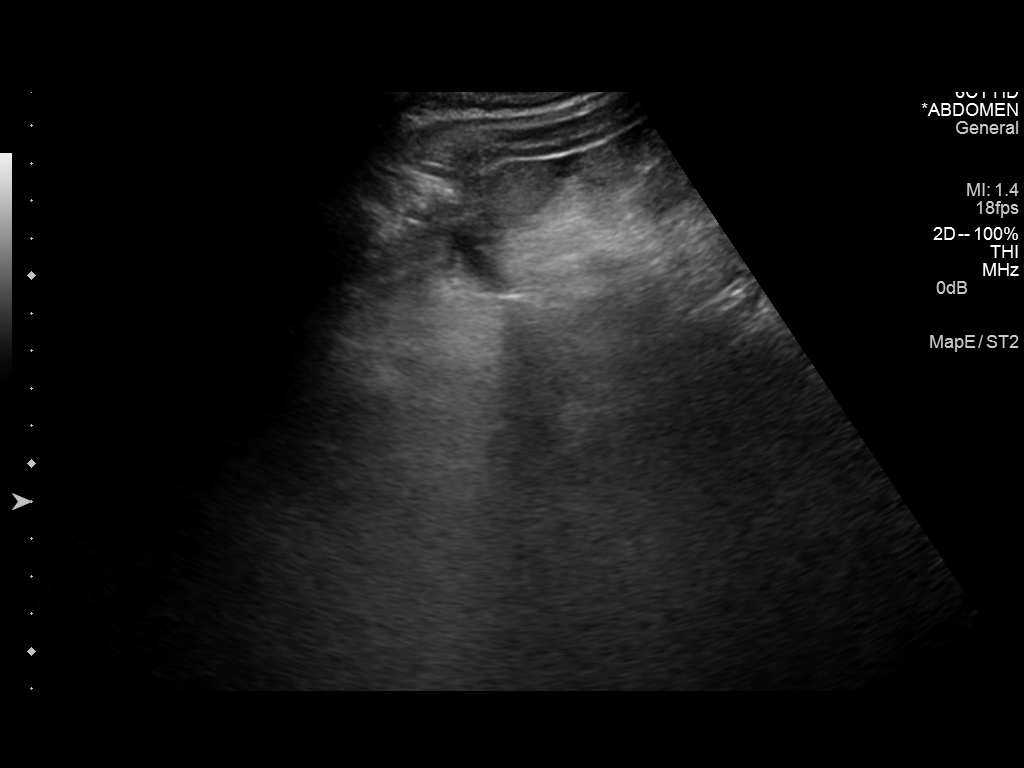

[6 of 6 positions shown; findings below may reference images not displayed]

Initial ultrasound scanning demonstrates a large amount of ascites
within the right upper abdominal quadrant. The right upper abdomen
was prepped and draped in the usual sterile fashion. 1% lidocaine
was used for local anesthesia. Under direct ultrasound guidance, a
19 gauge, 7-cm, Yueh catheter was introduced. An ultrasound image
was saved for documentation purposed. The paracentesis was
performed. The catheter was removed and a dressing was applied. The
patient tolerated the procedure well without immediate post
procedural complication. Post procedure IV Albumin was ordered.
FINDINGS: A total of approximately 5.8 liters of serous fluid was removed.
IMPRESSION: Successful ultrasound-guided paracentesis yielding 5.8 liters of
peritoneal fluid.

## 2015-06-26 IMAGING — US US PARACENTESIS
1 series · 6 of 6 positions shown · non-contrast
Comparison: 06/30/14 paracentesis.

MEDICATIONS:
None.

COMPLICATIONS:
None immediate

INDICATION: Cirrhosis, hepatitis C, recurrent ascites, request for paracentesis.

EXAM:
ULTRASOUND-GUIDED PARACENTESIS
TECHNIQUE: Informed written consent was obtained from the patient after a
discussion of the risks, benefits and alternatives to treatment. A
timeout was performed prior to the initiation of the procedure.

[Series 1: us paracentesis · 0.27mm/px · 6 of 6 slices shown]
[im 1/6]
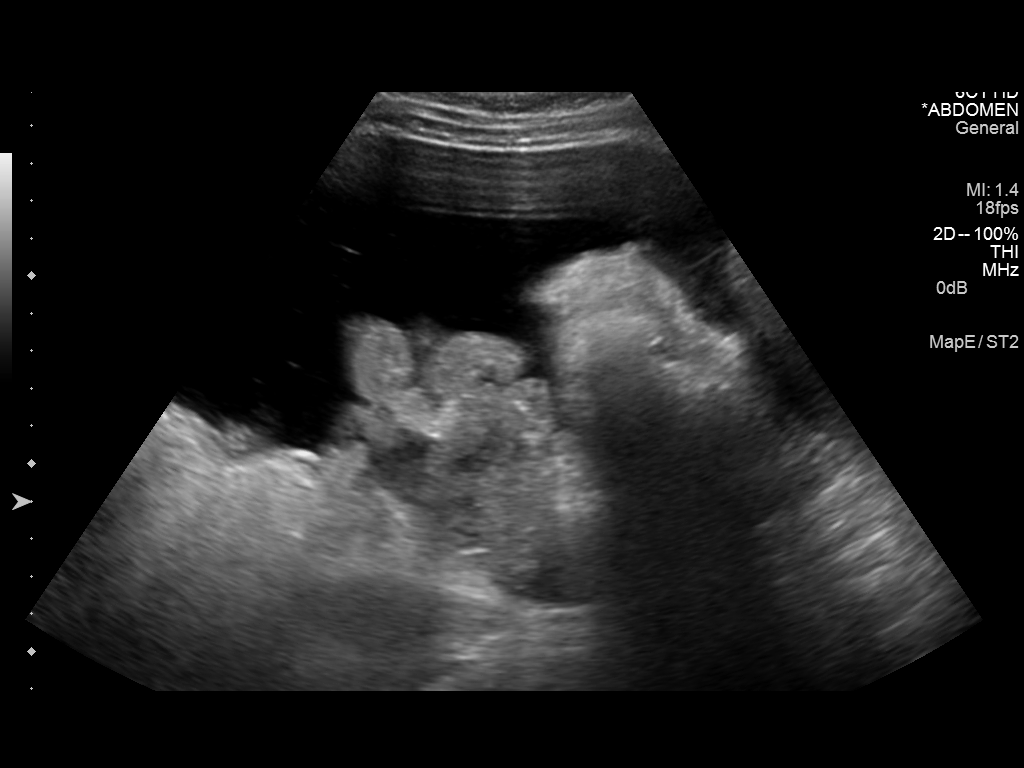
[im 2/6]
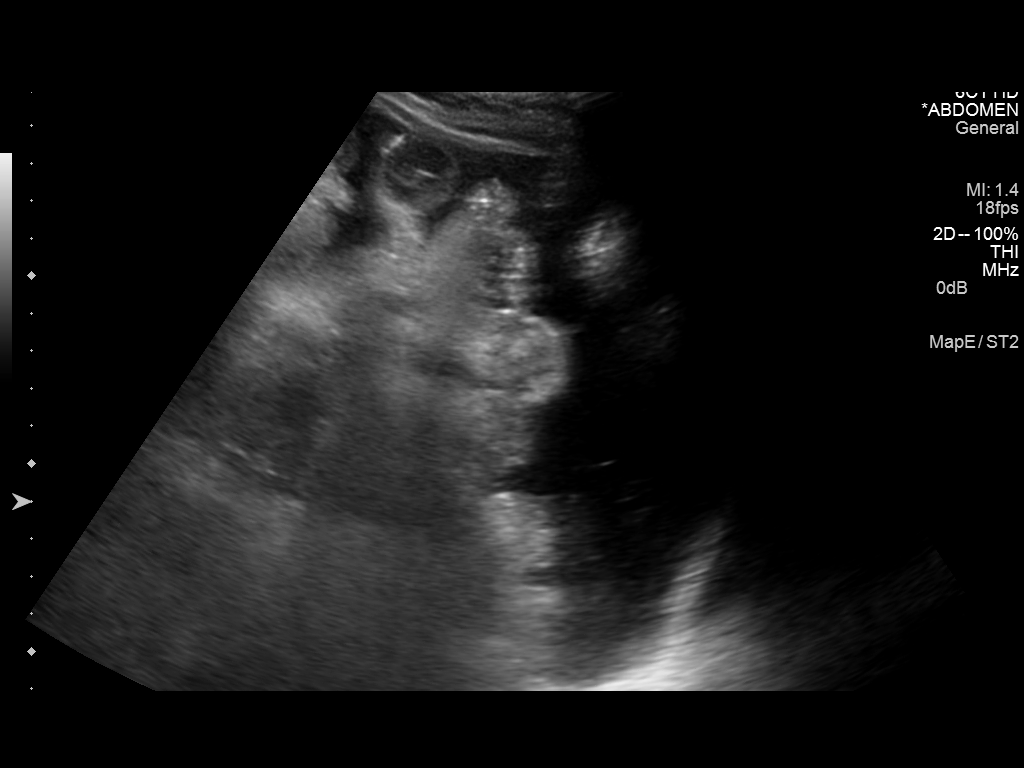
[im 3/6]
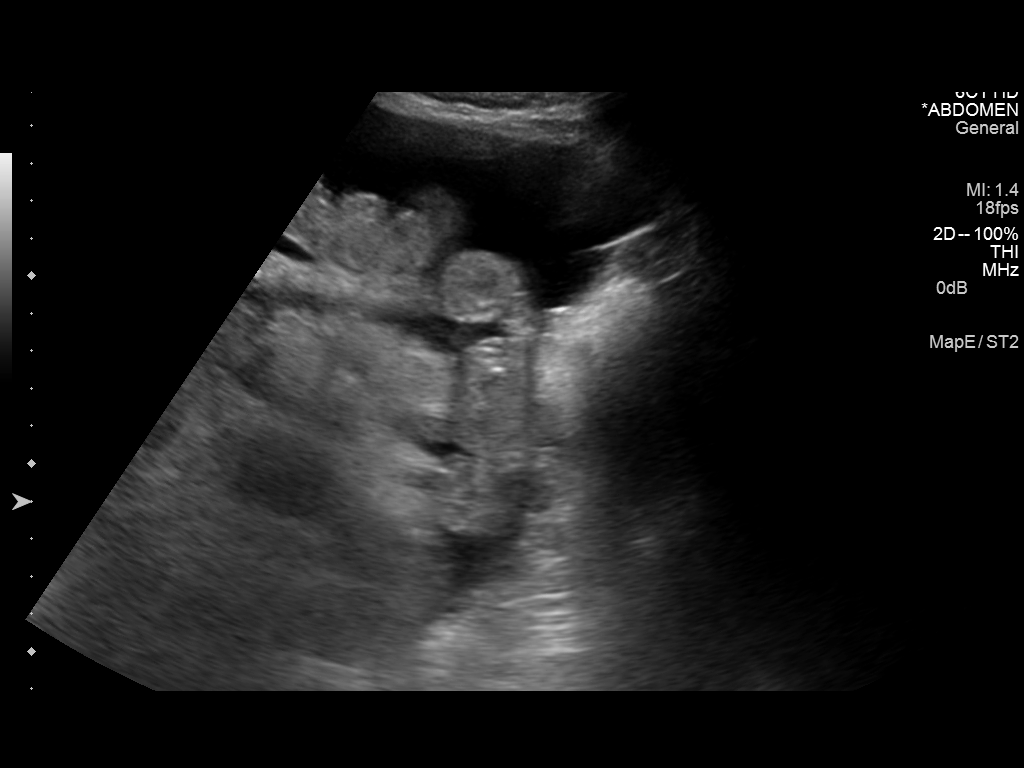
[im 4/6]
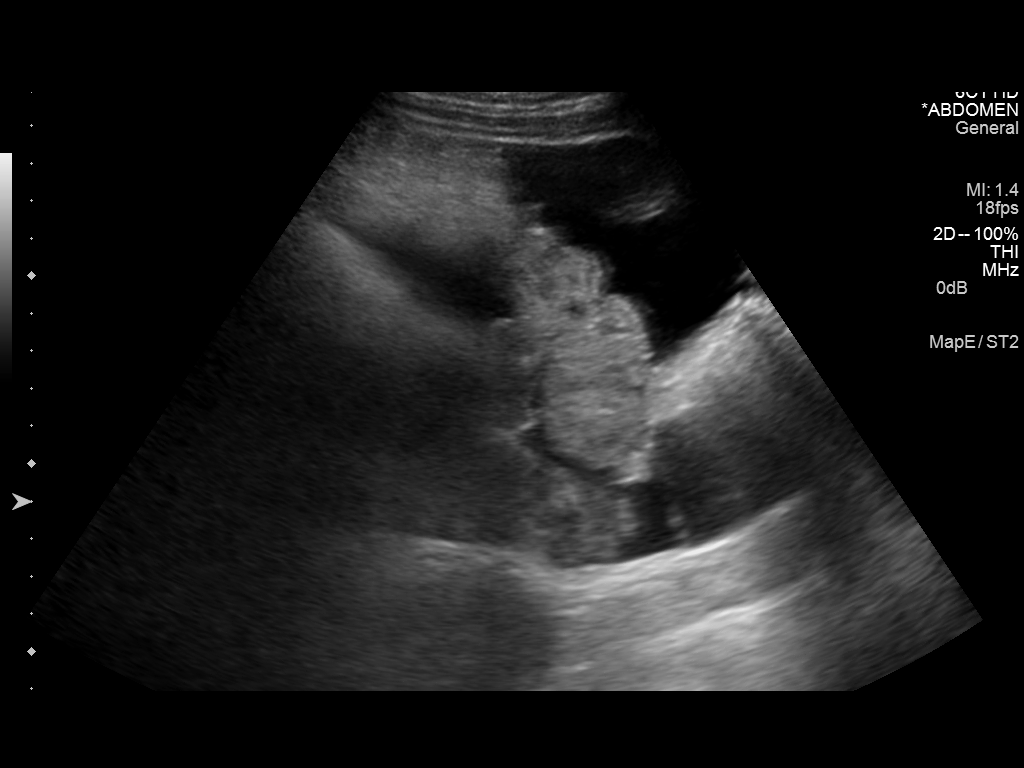
[im 5/6]
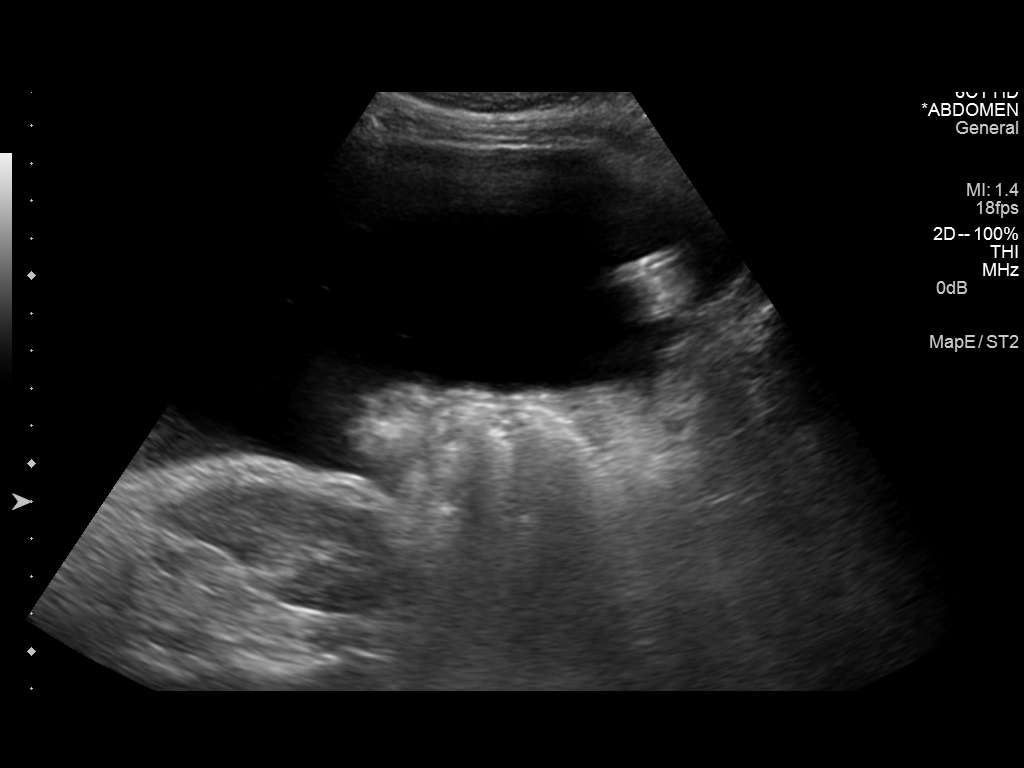
[im 6/6]
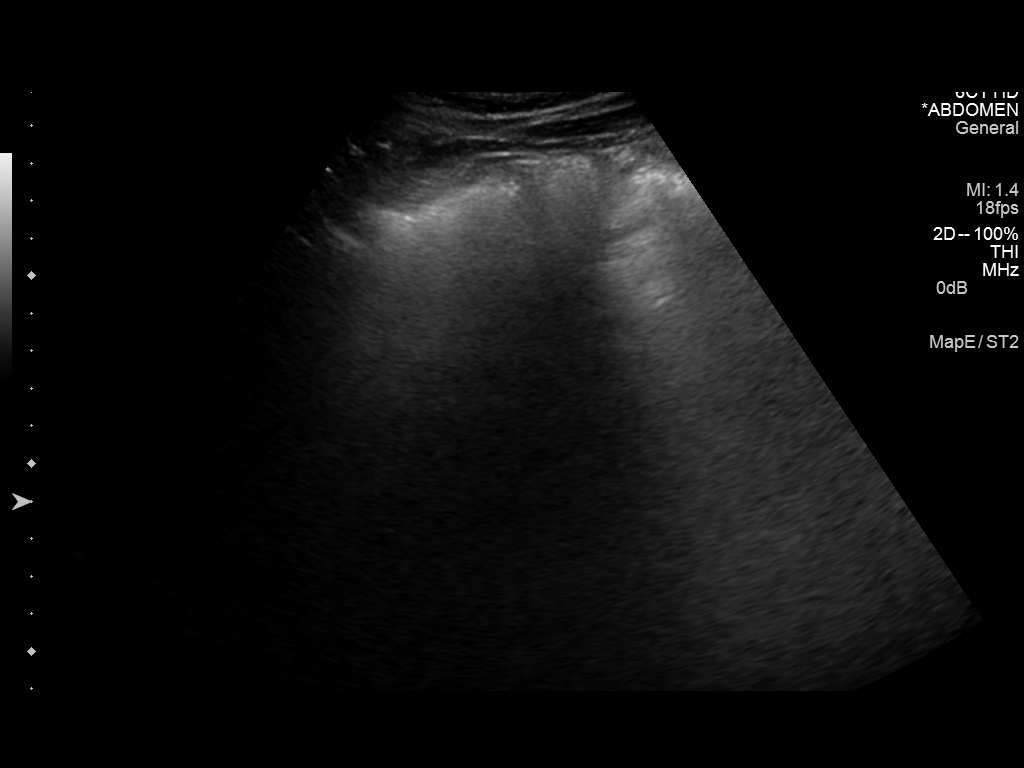

[6 of 6 positions shown; findings below may reference images not displayed]

Initial ultrasound scanning demonstrates a large amount of ascites
within the right lower abdominal quadrant. The right lower abdomen
was prepped and draped in the usual sterile fashion. 1% lidocaine
was used for local anesthesia. Under direct ultrasound guidance, a
19 gauge, 10-cm, Yueh catheter was introduced. An ultrasound image
was saved for documentation purposed. The paracentesis was
performed. The catheter was removed and a dressing was applied. The
patient tolerated the procedure well without immediate post
procedural complication.
FINDINGS: A total of approximately 4.6 liters of serous/amber colored fluid
was removed.
IMPRESSION: Successful ultrasound-guided paracentesis yielding 4.6 liters of
peritoneal fluid.

## 2015-08-04 IMAGING — US US PARACENTESIS
1 series · 5 of 5 positions shown · non-contrast
Comparison: Previous paracentesis

MEDICATIONS:
10 cc 1% lidocaine

COMPLICATIONS:
None immediate

INDICATION: Ascites

EXAM:
ULTRASOUND-GUIDED PARACENTESIS
TECHNIQUE: Informed written consent was obtained from the patient after a
discussion of the risks, benefits and alternatives to treatment. A
timeout was performed prior to the initiation of the procedure.

[Series 1: us paracentesis · 0.24mm/px · 5 of 5 slices shown]
[im 1/5]
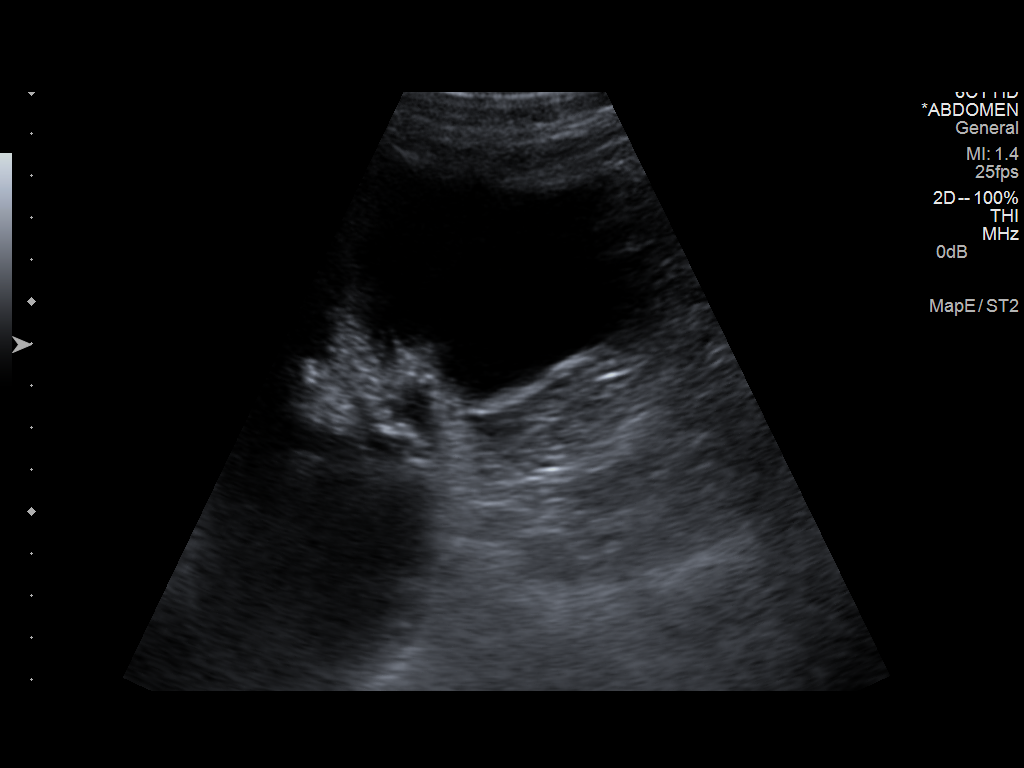
[im 2/5]
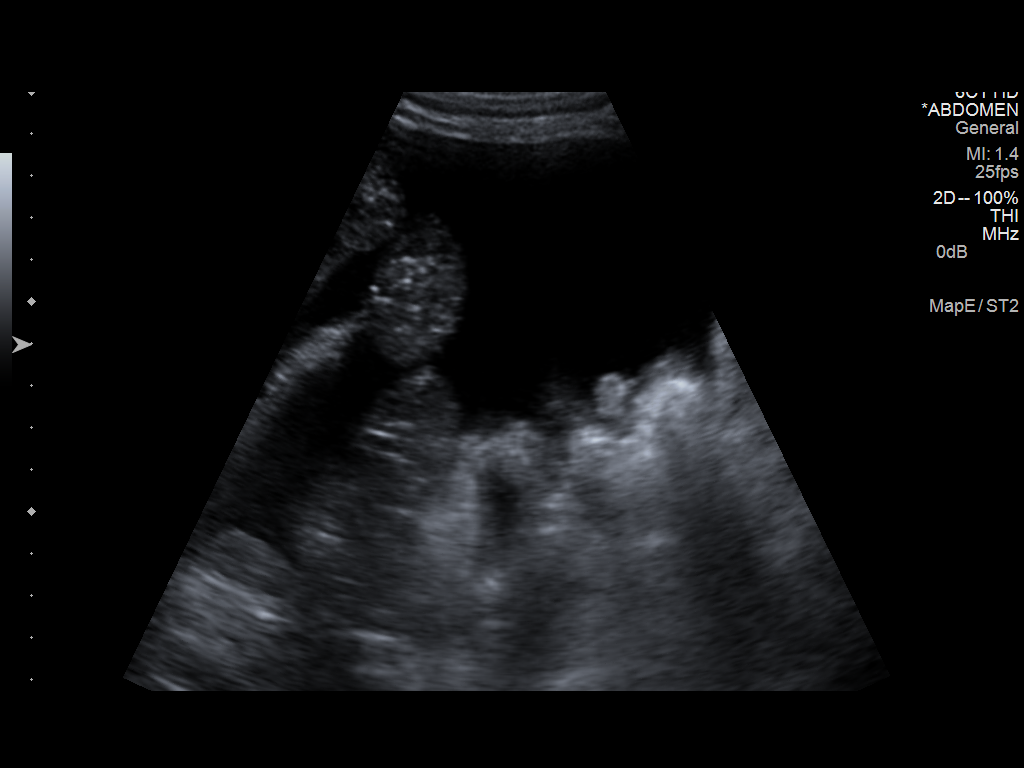
[im 3/5]
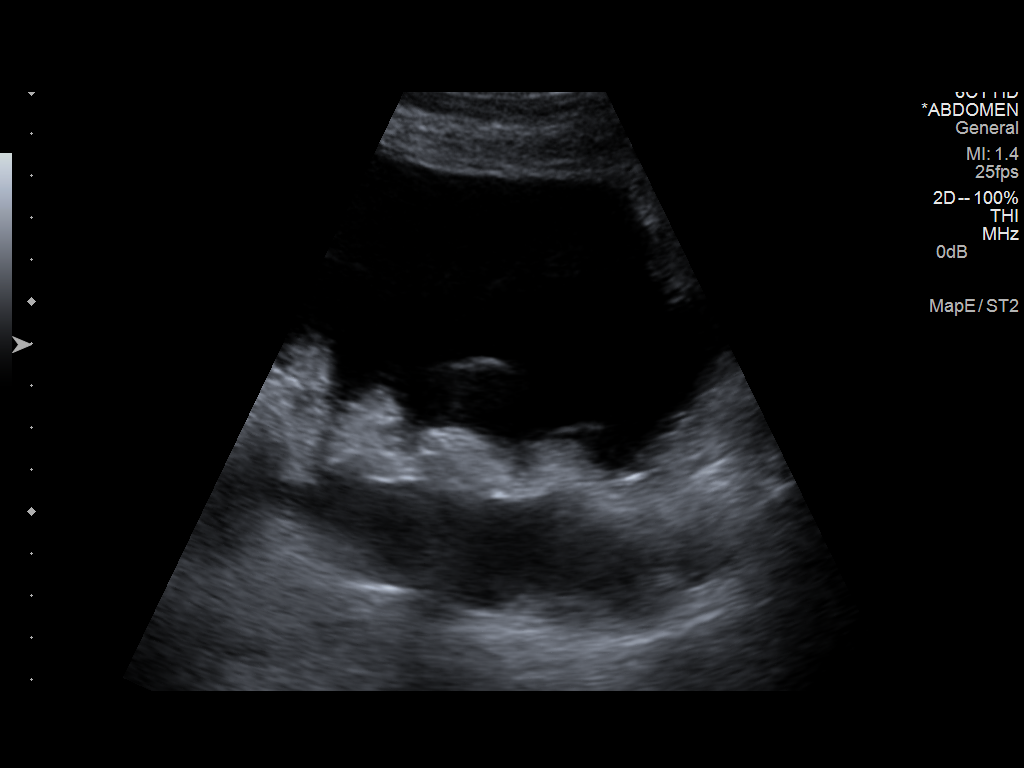
[im 4/5]
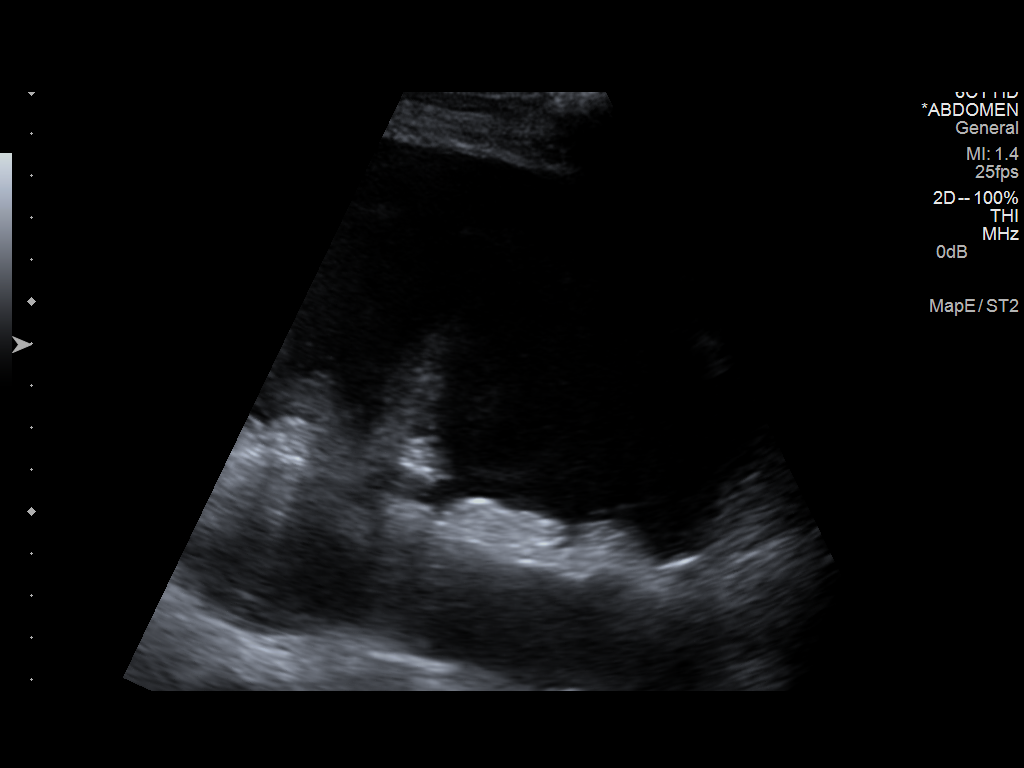
[im 5/5]
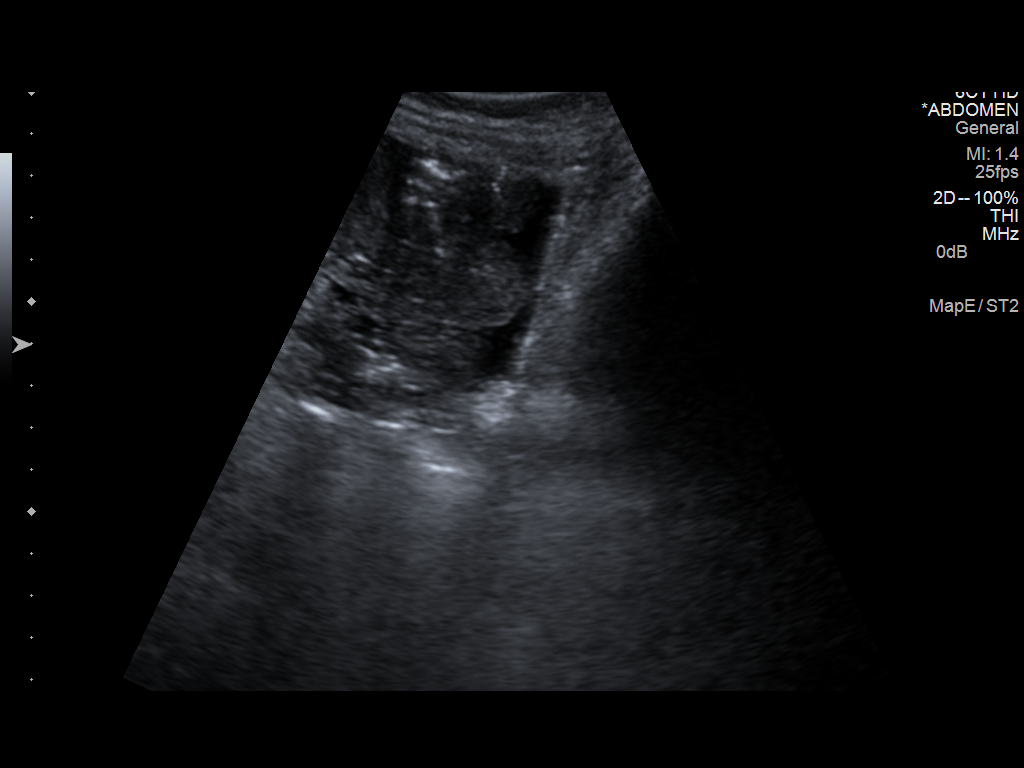

[5 of 5 positions shown; findings below may reference images not displayed]

Initial ultrasound scanning demonstrates a large amount of ascites
within the right lower abdominal quadrant. The right lower abdomen
was prepped and draped in the usual sterile fashion. 1% lidocaine
with epinephrine was used for local anesthesia. Under direct
ultrasound guidance, a 19 gauge, 7-cm, Yueh catheter was introduced.
An ultrasound image was saved for documentation purposed. the
paracentesis was performed. The catheter was removed and a dressing
was applied. The patient tolerated the procedure well without
immediate post procedural complication.
FINDINGS: A total of approximately 4.2 liters of amber fluid was removed.
IMPRESSION: Successful ultrasound-guided paracentesis yielding 4.2 liters of
peritoneal fluid.

25 grams IV albumin post procedure per JADIDA.

Read by:  Ptakas Indre
